# Patient Record
Sex: Female | Born: 1980 | Race: White | Hispanic: No | Marital: Married | Smoking: Former smoker
Health system: Southern US, Community
[De-identification: ages and names within clinical notes are randomized; demographics above are authoritative.]

## PROBLEM LIST (undated history)

## (undated) DIAGNOSIS — E88819 Insulin resistance, unspecified: Secondary | ICD-10-CM

## (undated) DIAGNOSIS — E282 Polycystic ovarian syndrome: Secondary | ICD-10-CM

## (undated) DIAGNOSIS — G43909 Migraine, unspecified, not intractable, without status migrainosus: Secondary | ICD-10-CM

## (undated) DIAGNOSIS — E8881 Metabolic syndrome: Secondary | ICD-10-CM

## (undated) DIAGNOSIS — E119 Type 2 diabetes mellitus without complications: Secondary | ICD-10-CM

## (undated) DIAGNOSIS — M797 Fibromyalgia: Secondary | ICD-10-CM

## (undated) HISTORY — PX: ESOPHAGOGASTRODUODENOSCOPY: SHX1529

## (undated) HISTORY — DX: Polycystic ovarian syndrome: E28.2

## (undated) HISTORY — DX: Migraine, unspecified, not intractable, without status migrainosus: G43.909

## (undated) HISTORY — DX: Type 2 diabetes mellitus without complications: E11.9

## (undated) HISTORY — PX: TUBAL LIGATION: SHX77

## (undated) HISTORY — DX: Insulin resistance, unspecified: E88.819

## (undated) HISTORY — DX: Metabolic syndrome: E88.81

## (undated) HISTORY — DX: Fibromyalgia: M79.7

## (undated) MED FILL — Iron Sucrose Inj 20 MG/ML (Fe Equiv): INTRAVENOUS | Qty: 10 | Status: AC

---

## 2005-10-27 ENCOUNTER — Ambulatory Visit: Payer: Self-pay | Admitting: Gastroenterology

## 2008-03-22 LAB — CONVERTED CEMR LAB: Pap Smear: NORMAL

## 2008-04-12 ENCOUNTER — Encounter: Payer: Self-pay | Admitting: Family Medicine

## 2008-04-17 ENCOUNTER — Encounter: Payer: Self-pay | Admitting: Family Medicine

## 2008-04-19 ENCOUNTER — Encounter: Payer: Self-pay | Admitting: Family Medicine

## 2008-05-01 ENCOUNTER — Encounter: Payer: Self-pay | Admitting: Family Medicine

## 2008-10-29 ENCOUNTER — Encounter: Payer: Self-pay | Admitting: Family Medicine

## 2008-12-17 ENCOUNTER — Ambulatory Visit: Payer: Self-pay | Admitting: Family Medicine

## 2008-12-17 DIAGNOSIS — E119 Type 2 diabetes mellitus without complications: Secondary | ICD-10-CM

## 2008-12-17 DIAGNOSIS — E282 Polycystic ovarian syndrome: Secondary | ICD-10-CM

## 2008-12-17 DIAGNOSIS — R1032 Left lower quadrant pain: Secondary | ICD-10-CM

## 2008-12-18 LAB — CONVERTED CEMR LAB
BUN: 6 mg/dL (ref 6–23)
Chloride: 103 meq/L (ref 96–112)
Cholesterol: 165 mg/dL (ref 0–200)
GFR calc non Af Amer: 91.69 mL/min (ref >60)
Glucose, Bld: 93 mg/dL (ref 70–99)
Potassium: 4 meq/L (ref 3.5–5.1)
Sodium: 139 meq/L (ref 135–145)

## 2008-12-20 ENCOUNTER — Encounter: Payer: Self-pay | Admitting: Family Medicine

## 2008-12-21 ENCOUNTER — Telehealth: Payer: Self-pay | Admitting: Family Medicine

## 2008-12-21 ENCOUNTER — Encounter: Payer: Self-pay | Admitting: Family Medicine

## 2009-02-14 ENCOUNTER — Encounter: Payer: Self-pay | Admitting: Family Medicine

## 2009-02-19 ENCOUNTER — Encounter: Payer: Self-pay | Admitting: Family Medicine

## 2009-02-19 DIAGNOSIS — N83209 Unspecified ovarian cyst, unspecified side: Secondary | ICD-10-CM

## 2009-03-15 ENCOUNTER — Encounter: Payer: Self-pay | Admitting: Family Medicine

## 2009-04-12 ENCOUNTER — Telehealth: Payer: Self-pay | Admitting: Family Medicine

## 2009-05-17 ENCOUNTER — Emergency Department: Payer: Self-pay | Admitting: Emergency Medicine

## 2009-05-24 ENCOUNTER — Inpatient Hospital Stay: Payer: Self-pay | Admitting: Obstetrics and Gynecology

## 2009-09-30 ENCOUNTER — Ambulatory Visit: Payer: Self-pay | Admitting: Obstetrics and Gynecology

## 2009-10-15 ENCOUNTER — Observation Stay: Payer: Self-pay | Admitting: Obstetrics and Gynecology

## 2009-10-21 ENCOUNTER — Observation Stay: Payer: Self-pay | Admitting: Obstetrics and Gynecology

## 2009-11-05 ENCOUNTER — Observation Stay: Payer: Self-pay | Admitting: Obstetrics and Gynecology

## 2009-11-17 ENCOUNTER — Observation Stay: Payer: Self-pay | Admitting: Obstetrics and Gynecology

## 2009-11-24 ENCOUNTER — Observation Stay: Payer: Self-pay

## 2009-11-29 ENCOUNTER — Observation Stay: Payer: Self-pay | Admitting: Obstetrics and Gynecology

## 2009-11-30 ENCOUNTER — Observation Stay: Payer: Self-pay | Admitting: Obstetrics and Gynecology

## 2009-12-09 ENCOUNTER — Inpatient Hospital Stay: Payer: Self-pay | Admitting: Obstetrics and Gynecology

## 2010-03-21 ENCOUNTER — Ambulatory Visit
Admission: RE | Admit: 2010-03-21 | Discharge: 2010-03-21 | Payer: Self-pay | Source: Home / Self Care | Attending: Family Medicine | Admitting: Family Medicine

## 2010-03-21 DIAGNOSIS — R21 Rash and other nonspecific skin eruption: Secondary | ICD-10-CM | POA: Insufficient documentation

## 2010-04-01 NOTE — Progress Notes (Signed)
Summary: regarding metformin  Phone Note Call from Patient Call back at Home Phone [phone]   Caller: Patient Call For: Ruthe Mannan MD Summary of Call: Pt has found out that she is pregnant and is asking if she should continue her metformin.  She does have an OB so I told her to check with that office as well.  She is asking if you need to see her regarding this, she will need refills called in next week.  Please advise, she knows that you are out tll monday. Initial call taken by: Lowella Petties CMA,  April 12, 2009 3:10 PM  Follow-up for Phone Call        Meformin is pregnancy Class B, probably safe in pregnancy.  I would however still check with her OB. Follow-up by: Ruthe Mannan MD,  April 15, 2009 8:10 AM  Additional Follow-up for Phone Call Additional follow up Details #1::        Patient Advised.  Additional Follow-up by: Delilah Shan CMA Duncan Dull),  April 15, 2009 10:34 AM

## 2010-04-02 ENCOUNTER — Telehealth: Payer: Self-pay | Admitting: Family Medicine

## 2010-04-03 NOTE — Assessment & Plan Note (Signed)
Summary: ?sinus infection/alc   Vital Signs:  Patient profile:   30 year old female Height:      62.5 inches Weight:      162.25 pounds BMI:     29.31 Temp:     98.3 degrees F oral Pulse rate:   84 / minute Pulse rhythm:   regular BP sitting:   102 / 64  (left arm) Cuff size:   regular  Vitals Entered By: Delilah Shan CMA Ariz Terrones Dull) (March 21, 2010 2:10 PM) CC: ? sinus infection   History of Present Illness: ST.  Stuffy.  coughing and yellow sputum.  Sx started 1.5 weeks.  Taking mucinex/otc meds.  No fevers.  Cough worse at night.  No ear pain.  Facial pain near the nose.  Not wheezing.   NSVD, gestational DM2.  Sugar has been controlled during the day.  Was prev breastfeeding and now using soy formula.  NKDA.    Rash and bleeding on the knuckles, worse with soaps.  Asking for advice.    It was better during pregnancy.  Allergies (verified): No Known Drug Allergies  Review of Systems       See HPI.  Otherwise negative.    Physical Exam  General:  GEN: nad, alert and oriented HEENT: mucous membranes moist, TM w/o erythema, nasal epithelium injected, OP with cobblestoning NECK: supple w/o LA CV: rrr. PULM: ctab, no inc wob ABD: soft, +bs EXT: no edema  max sinus tender to palpation bilaterally skin with mild irriation along in the extensor IP joints on hands bilaterally   Impression & Recommendations:  Problem # 1:  SINUSITIS - ACUTE-NOS (ICD-461.9) Start amoxil and supportive tx o/w.  She agrees.  Nontoxic.  Her updated medication list for this problem includes:    Amoxicillin 500 Mg Tabs (Amoxicillin) .Marland Kitchen... 2 by mouth two times a day  Orders: Prescription Created Electronically [phone])  Problem # 2:  SKIN RASH (ICD-782.1) Likely combination of irritation and eczema.  Rec OTC hydrocortisone as needed.  Can use gloves/lotion as needed. She agrees.   follow up as needed.  The following medications were removed from the medication list:    Triamcinolone  Acetonide 0.5 % Crea (Triamcinolone acetonide) .Marland Kitchen... Apply to affected area as needed  Complete Medication List: 1)  Metformin Hcl 500 Mg Xr24h-tab (Metformin hcl) .... Take 2 tablets by mouth at night 2)  Amoxicillin 500 Mg Tabs (Amoxicillin) .... 2 by mouth two times a day  Patient Instructions: 1)  Get plenty of rest, drink lots of clear liquids, and use Tylenol or Ibuprofen for fever and comfort. Start the antibiotics today and let us know if you aren't improving.   Prescriptions: AMOXICILLIN 500 MG TABS (AMOXICILLIN) 2 by mouth two times a day  #40 x 0   Entered and Authorized by:   Crawford Givens MD   Signed by:   Crawford Givens MD on 03/21/2010   Method used:   Electronically to        [facility]* (retail)       [address]       [place], [place]  [address]       Ph: [phone]       Fax: [phone]   RxID:   [id number]    Orders Added: 1)  Prescription Created Electronically [G8553] 2)  Est. Patient Level III [25427]    Current Allergies (reviewed today): No known allergies

## 2010-04-09 NOTE — Progress Notes (Signed)
Summary: still has sinus symptoms  Phone Note Call from Patient Call back at Work Phone [phone]   Caller: Patient Call For: Dr. Sharen Hones Summary of Call: Pt was taking amox for a sinus infection, she finished this 2 days ago and still has sxs- stuffy nose, green drainage, cough.  No fever.  She is asking if she needs to continue on with more amox or try something else.  [facility] Initial call taken by: Lowella Petties CMA, AAMA,  April 02, 2010 12:09 PM  Follow-up for Phone Call        may wait until provider who saw her comes in this PM as I've never seen pt. Follow-up by: Eustaquio Boyden  MD,  April 02, 2010 12:30 PM  Additional Follow-up for Phone Call Additional follow up Details #1::        pleae call in zithromax 250mg , 2 by mouth today and then 1 by mouth once daily for 4 days.  #6, no rf, and follow up next week if not improved. Please talk to her about drinking plenty of fluids, nasal saline, etc. Additional Follow-up by: Crawford Givens MD,  April 02, 2010 12:43 PM    Additional Follow-up for Phone Call Additional follow up Details #2::    Advises pt, medicine called to [facility], emr updated.                  Lowella Petties CMA, AAMA  April 02, 2010 3:46 PM   New/Updated Medications: ZITHROMAX 250 MG TABS (AZITHROMYCIN) take as directed Prescriptions: ZITHROMAX 250 MG TABS (AZITHROMYCIN) take as directed  #1 pack x 0   Entered by:   Lowella Petties CMA, AAMA   Authorized by:   Crawford Givens MD   Signed by:   Lowella Petties CMA, AAMA on 04/02/2010   Method used:   Telephoned to ...       [facility]* (retail)       [address]       [place], [place]  [address]       Ph: [phone]       Fax: [phone]   RxID:   [id number]   Prior Medications: METFORMIN HCL 500 MG XR24H-TAB (METFORMIN HCL) Take 2 tablets by mouth at night Current Allergies: No known allergies

## 2010-04-30 ENCOUNTER — Encounter: Payer: Self-pay | Admitting: Family Medicine

## 2010-04-30 ENCOUNTER — Ambulatory Visit (INDEPENDENT_AMBULATORY_CARE_PROVIDER_SITE_OTHER): Payer: Managed Care, Other (non HMO) | Admitting: Family Medicine

## 2010-04-30 DIAGNOSIS — M545 Low back pain: Secondary | ICD-10-CM

## 2010-04-30 LAB — CONVERTED CEMR LAB
Bilirubin Urine: NEGATIVE
Ketones, urine, test strip: NEGATIVE
Nitrite: NEGATIVE
Specific Gravity, Urine: 1.02
Urobilinogen, UA: 0.2

## 2010-05-08 NOTE — Assessment & Plan Note (Signed)
Summary: BACK PAIN/CLE   [employer]   Vital Signs:  Patient profile:   30 year old female Weight:      163 pounds Temp:     98.1 degrees F oral Pulse rate:   76 / minute Pulse rhythm:   regular BP sitting:   98 / 60  (left arm) Cuff size:   regular  Vitals Entered By: Selena Batten Dance CMA Duncan Dull) (April 30, 2010 9:22 AM) CC: Back pain   History of Present Illness: CC: back pain  back pain going on since October, first episode was 2 wks after had baby.  Pain starts L upper lumbar region (sometimes R as well).  Sharp stabbing pain, lasts 20 min to 1 hour.  Stays in back.  when happens, does get nauseated and vomits (2/2 pain).  Back pain not associated with headaches.  Has tried heating pad and husband rubbing back.  Also tried goody powders and ibuprofen.  worse if prolonged sitting or bending over.  Not breast feeding any more.  Not constant, hasn't found any inciting event.  No radiculopathy, tingling/numbness or saddle anesthesia, weakness in legs, bowel/bladder incontinence or trouble voiding (retention).  No fevers/chills.  no dysuria, polyuria, urgency.  Current Medications (verified): 1)  Metformin Hcl 500 Mg Xr24h-Tab (Metformin Hcl) .... Take 2 Tablets By Mouth At Night  Allergies (verified): No Known Drug Allergies  Past History:  Past Medical History: Last updated: 12/17/2008 PCOS with infertility, followed by [facility] Insulin resistance Migraines  Past Surgical History: EGD  Social History: Lives with husbnad.  Works at [employer]. Going back to school. Never Smoked Alcohol use-no Drug use-no  Review of Systems       per HPI  Physical Exam  General:  Well-developed,well-nourished,in no acute distress; alert,appropriate and cooperative throughout examination Mouth:  Oral mucosa and oropharynx without lesions or exudates.  Teeth in good repair. Lungs:  Normal respiratory effort, chest expands symmetrically. Lungs are clear to auscultation, no  crackles or wheezes. Heart:  Normal rate and regular rhythm. S1 and S2 normal without gallop, murmur, click, rub or other extra sounds. Abdomen:  Bowel sounds positive,abdomen soft and non-tender without masses, organomegaly or hernias noted.  no CVA tenderness Msk:  midline spinal tenderness mild at thoracic region.  Also at upper lumbar region.  + bilateral lumbar paraspinous mm tenderness L>R /tightness R>L.  neg SLR bilaterally.  No SIJ or GTB pain.  FROM at spine. Pulses:  2+ rad pulses Extremities:  no pedal edema Neurologic:  sensation/strength intact BUE and BLE. Skin:  Intact without suspicious lesions or rashes   Impression & Recommendations:  Problem # 1:  BACK PAIN, LUMBAR (ICD-724.2) Assessment New  consistent with lumbar strain vs muscle strain/spasm.  Treat with flexeril/naprosyn, ice/heat, and lower back strengthening exercises provided from [facility] patient advisor  return if not improved iwth this regimen for further evaluation.    UA done but I didn't realize until later.  no evidence of infection.  tr blood.  Her updated medication list for this problem includes:    Naprosyn 500 Mg Tabs (Naproxen) .Marland Kitchen... Take one twice daily with food for 7 days then as needed back pain    Flexeril 5 Mg Tabs (Cyclobenzaprine hcl) ..... One twice daily as needed muscle spasm  Orders: UA Dipstick w/o Micro (manual) (40981)  Complete Medication List: 1)  Metformin Hcl 500 Mg Xr24h-tab (Metformin hcl) .... Take 2 tablets by mouth at night 2)  Naprosyn 500 Mg Tabs (Naproxen) .... Take  one twice daily with food for 7 days then as needed back pain 3)  Flexeril 5 Mg Tabs (Cyclobenzaprine hcl) .... One twice daily as needed muscle spasm  Patient Instructions: 1)  Sounds like possible lumbar strain/muscle spasm. 2)  Treat with naprosyn twice daily for 7 days then as needed (with food).  Take flexeril two times a day as needed back pain 3)  Heating pad to back. 4)  Back stretching exercises  provided today. 5)  If not better with these measures, please return to be seen.  If worsening, return sooner.   6)  Call us with questions Prescriptions: FLEXERIL 5 MG TABS (CYCLOBENZAPRINE HCL) one twice daily as needed muscle spasm  #30 x 0   Entered and Authorized by:   Eustaquio Boyden  MD   Signed by:   Eustaquio Boyden  MD on 04/30/2010   Method used:   Electronically to        [facility]* (retail)       [address]       [place], [place]  [address]       Ph: [phone]       Fax: [phone]   RxID:   [id number] NAPROSYN 500 MG TABS (NAPROXEN) take one twice daily with food for 7 days then as needed back pain  #60 x 0   Entered and Authorized by:   Eustaquio Boyden  MD   Signed by:   Eustaquio Boyden  MD on 04/30/2010   Method used:   Electronically to        [facility]* (retail)       [address]       [place], [place]  [address]       Ph: [phone]       Fax: [phone]   RxID:   [id number]    Orders Added: 1)  Est. Patient Level III [01601] 2)  UA Dipstick w/o Micro (manual) [81002]    Current Allergies (reviewed today): No known allergies   Laboratory Results   Urine Tests  Date/Time Received: April 30, 2010 9:28 AM  Date/Time Reported: April 30, 2010 9:28 AM   Routine Urinalysis   Color: yellow Appearance: Hazy Glucose: negative   (Normal Range: Negative) Bilirubin: negative   (Normal Range: Negative) Ketone: negative   (Normal Range: Negative) Spec. Gravity: 1.020   (Normal Range: 1.003-1.035) Blood: trace-lysed   (Normal Range: Negative) pH: 6.0   (Normal Range: 5.0-8.0) Protein: negative   (Normal Range: Negative) Urobilinogen: 0.2   (Normal Range: 0-1) Nitrite: negative   (Normal Range: Negative) Leukocyte Esterace: negative   (Normal Range: Negative)       Appended Document: BACK PAIN/CLE   [employer]    Clinical Lists Changes  Observations: Added new observation of COMMENTS: read by ........Eustaquio Boyden  MD  May 01, 2010 12:49 PM  (05/01/2010 12:48) Added new observation of UA YEAST: no (05/01/2010 12:48) Added new observation of CASTS URINE: no (05/01/2010 12:48) Added new observation of URINE CRYSTA: no (05/01/2010 12:48) Added new observation of EPI CELL UR: 1-5 (05/01/2010 12:48) Added new observation of MUCUS URINE: yes (05/01/2010 12:48) Added new observation of BACTERIA URN: 1+ rods (05/01/2010 12:48) Added new observation of RBCS MICRO U: 0  (05/01/2010 12:48) Added new observation of WBCS MICRO U: 0 cells/hpf (05/01/2010 12:48)    read morning after spun.  Laboratory Results   Urine Tests    Urine Microscopic WBC/HPF: 0 RBC/HPF: 0 Bacteria/HPF: 1+ rods Mucous/HPF: yes Epithelial/HPF: 1-5  Crystals/HPF: no Casts/LPF: no Yeast/HPF: no    Comments: read by ........Eustaquio Boyden  MD  May 01, 2010 12:49 PM

## 2010-05-27 ENCOUNTER — Encounter: Payer: Self-pay | Admitting: Family Medicine

## 2010-06-03 ENCOUNTER — Ambulatory Visit (INDEPENDENT_AMBULATORY_CARE_PROVIDER_SITE_OTHER): Payer: Managed Care, Other (non HMO) | Admitting: Family Medicine

## 2010-06-03 ENCOUNTER — Encounter: Payer: Self-pay | Admitting: Family Medicine

## 2010-06-03 DIAGNOSIS — Z0000 Encounter for general adult medical examination without abnormal findings: Secondary | ICD-10-CM

## 2010-06-03 DIAGNOSIS — E282 Polycystic ovarian syndrome: Secondary | ICD-10-CM

## 2010-06-03 DIAGNOSIS — E348 Other specified endocrine disorders: Secondary | ICD-10-CM

## 2010-06-03 DIAGNOSIS — R21 Rash and other nonspecific skin eruption: Secondary | ICD-10-CM

## 2010-06-03 MED ORDER — FLUOCINONIDE 0.05 % EX OINT: TOPICAL_OINTMENT | Freq: Two times a day (BID) | CUTANEOUS | Status: AC

## 2010-06-03 MED ORDER — METAXALONE 800 MG PO TABS: 800.0000 mg | ORAL_TABLET | Freq: Three times a day (TID) | ORAL | Status: DC

## 2010-06-03 NOTE — Progress Notes (Signed)
30 yo female here for CPX.  PCOS- with h/o insulin resistance.  On Metformin 1000 mg daily. Feels she is doing well. Had follow up with OBGYN last month.  Back spasms- on her feet all day for work. Massage helps but at times needs a muscle relaxants.  Happens in both upper and lower back. Denies any radiculopathy or urinary symptoms.  The PMH, PSH, Social History, Family History, Medications, and allergies have been reviewed in [facility], and have been updated if relevant.  Review of Systems       See HPI General: Denies fever, chills, sweats. No significant weight loss. Eyes: Denies blurring,significant itching ENT: Denies earache, sore throat, and hoarseness.  Cardiovascular: Denies chest pains, palpitations, dyspnea on exertion,  Respiratory: Denies cough, dyspnea at rest,wheeezing Breast: no concerns about lumps GI: Denies nausea, vomiting, diarrhea, constipation, change in bowel habits, abdominal pain, melena, hematochezia GU: Denies dysuria, hematuria, urinary hesitancy, nocturia, denies STD risk, no concerns about discharge Derm: pos for rash rash, itching Neuro: Denies  paresthesias, frequent falls, frequent headaches Psych: Denies depression, anxiety Endocrine: Denies cold intolerance, heat intolerance, polydipsia Heme: Denies enlarged lymph nodes Allergy: No hayfever  Physical Exam BP 110/70  Pulse 84  Temp(Src) 98.7 F (37.1 C) (Oral)  Ht 5' 2.5" (1.588 m)  Wt 161 lb 12.8 oz (73.392 kg)  BMI 29.12 kg/m2  LMP 05/27/2010  General:  alert, overweight appearing , NAD Ears:  External ear exam shows no significant lesions or deformities.  Otoscopic examination reveals clear canals, tympanic membranes are intact bilaterally without bulging, retraction, inflammation or discharge. Hearing is grossly normal bilaterally. Mouth:  Oral mucosa and oropharynx without lesions or exudates.  Teeth in good repair. Neck:  No deformities, masses, or tenderness noted. Lungs:  Normal  respiratory effort, chest expands symmetrically. Lungs are clear to auscultation, no crackles or wheezes. Heart:  Normal rate and regular rhythm. S1 and S2 normal without gallop, murmur, click, rub or other extra sounds. Abdomen:  soft and normal bowel sounds.   No rebound, no guarding. Extremities:  No clubbing, cyanosis, edema, or deformity noted with normal full range of motion of all joints.   Skin:  Intact without suspicious lesions or rashes Psych:  normally interactive, good eye contact, and not anxious appearing.

## 2010-06-03 NOTE — Assessment & Plan Note (Signed)
Continue Metformin. Recheck a1c today.

## 2010-06-03 NOTE — Assessment & Plan Note (Signed)
Reviewed preventive care protocols, scheduled due services, and updated immunizations Discussed nutrition, exercise, diet, and healthy lifestyle.  Orders Placed This Encounter  Procedures  . TSH  . HgB A1c  . Lipid Profile  . Basic Metabolic Panel (BMET)

## 2010-06-04 LAB — LIPID PANEL
HDL: 62.1 mg/dL (ref >39.00)
Total CHOL/HDL Ratio: 3
VLDL: 32.2 mg/dL (ref 0.0–40.0)

## 2010-06-04 LAB — BASIC METABOLIC PANEL
Calcium: 9.3 mg/dL (ref 8.4–10.5)
GFR: 105.82 mL/min (ref >60.00)
Sodium: 139 mEq/L (ref 135–145)

## 2010-07-17 ENCOUNTER — Ambulatory Visit (INDEPENDENT_AMBULATORY_CARE_PROVIDER_SITE_OTHER): Payer: Managed Care, Other (non HMO) | Admitting: Family Medicine

## 2010-07-17 ENCOUNTER — Encounter: Payer: Self-pay | Admitting: Family Medicine

## 2010-07-17 VITALS — BP 120/74 | HR 96 | Temp 98.6°F | Ht 62.25 in | Wt 162.8 lb

## 2010-07-17 DIAGNOSIS — J329 Chronic sinusitis, unspecified: Secondary | ICD-10-CM

## 2010-07-17 MED ORDER — GUAIFENESIN-CODEINE 100-10 MG/5ML PO SYRP: 5.0000 mL | ORAL_SOLUTION | Freq: Two times a day (BID) | ORAL | Status: DC | PRN

## 2010-07-17 MED ORDER — AZITHROMYCIN 250 MG PO TABS: ORAL_TABLET | ORAL | Status: AC

## 2010-07-17 NOTE — Progress Notes (Signed)
Subjective:     Holly Farley is a 30 y.o. female who presents for evaluation of sinus pain. Symptoms include: congestion, cough, fevers and nasal congestion. Onset of symptoms was 5 days ago. Symptoms have been gradually worsening since that time. Past history is significant for no history of pneumonia or bronchitis. Patient is a non-smoker.  Infant son on abx for otitis media currently.    The PMH, PSH, Social History, Family History, Medications, and allergies have been reviewed in [facility], and have been updated if relevant.   Review of Systems Pertinent items are noted in HPI.   Objective:    BP 120/74  Pulse 96  Temp(Src) 98.6 F (37 C) (Oral)  Ht 5' 2.25" (1.581 m)  Wt 162 lb 12.8 oz (73.846 kg)  BMI 29.54 kg/m2  LMP 06/26/2010  General Appearance:    Alert, cooperative, no distress, appears stated age  Head:    Normocephalic, without obvious abnormality, atraumatic  Eyes:    PERRL, conjunctiva/corneas clear, EOM's intact, fundi    benign, both eyes  Ears:    TMs retracted bilaterally  Nose:  erythematous boggy turbinates, frontal sinuses TTP  Throat:   Erythema, no exudate   Neck:   Supple, symmetrical, trachea midline, no adenopathy;    thyroid:  no enlargement/tenderness/nodules; no carotid   bruit or JVD  Back:     Symmetric, no curvature, ROM normal, no CVA tenderness  Lungs:     Clear to auscultation bilaterally, respirations unlabored  Chest Wall:    No tenderness or deformity   Heart:    Regular rate and rhythm, S1 and S2 normal, no murmur, rub   or gallop  Skin:   Skin color, texture, turgor normal, no rashes or lesions  Lymph nodes:   Cervical, supraclavicular, and axillary nodes normal  Neurologic:   CNII-XII intact, normal strength, sensation and reflexes    throughout      Assessment:    Acute bacterial sinusitis.    Plan:    Nasal saline sprays. Azithromycin per medication orders.

## 2010-08-20 ENCOUNTER — Telehealth: Payer: Self-pay | Admitting: *Deleted

## 2010-08-20 DIAGNOSIS — M549 Dorsalgia, unspecified: Secondary | ICD-10-CM

## 2010-08-20 MED ORDER — METAXALONE 800 MG PO TABS: 800.0000 mg | ORAL_TABLET | Freq: Three times a day (TID) | ORAL | Status: DC

## 2010-08-20 NOTE — Telephone Encounter (Signed)
Rx called to pharmacy, patient will wait to hear from [facility] on referral.

## 2010-08-20 NOTE — Telephone Encounter (Signed)
Please call in her skelaxin but she now needs ortho referral. I will place referral.

## 2010-08-20 NOTE — Telephone Encounter (Signed)
Patient advised as instructed via telephone.  She stated that her pain now radiated up her back when she puts weight on her right foot.

## 2010-08-20 NOTE — Telephone Encounter (Signed)
Ok to refill her skelaxin, 30 tablets, no refills. If this does not help, need to refer to ortho or PT.

## 2010-08-20 NOTE — Telephone Encounter (Signed)
Patient says that you had been treating her for back spasms. She says that they are now getting worse and is asking if she could have something called in or what suggestions you have for her at this point. Uses [facility].

## 2010-10-22 ENCOUNTER — Ambulatory Visit (INDEPENDENT_AMBULATORY_CARE_PROVIDER_SITE_OTHER): Payer: Managed Care, Other (non HMO) | Admitting: Family Medicine

## 2010-10-22 ENCOUNTER — Encounter: Payer: Self-pay | Admitting: Family Medicine

## 2010-10-22 VITALS — BP 110/72 | HR 76 | Temp 98.0°F | Wt 167.2 lb

## 2010-10-22 DIAGNOSIS — J0100 Acute maxillary sinusitis, unspecified: Secondary | ICD-10-CM | POA: Insufficient documentation

## 2010-10-22 DIAGNOSIS — J019 Acute sinusitis, unspecified: Secondary | ICD-10-CM

## 2010-10-22 MED ORDER — FLUTICASONE PROPIONATE 50 MCG/ACT NA SUSP: 2.0000 | Freq: Every day | NASAL | Status: DC

## 2010-10-22 NOTE — Assessment & Plan Note (Addendum)
Likely viral treat with supportive care, as per pt instructions. Update if fever >101.5 or lasting longer than 10 days. flonase for sinus inflammation/congestion

## 2010-10-22 NOTE — Patient Instructions (Addendum)
You have an early sinus infection, likely viral. Take medicine as prescribed: flonase 2 squirts into each nostril daily. Push fluids and plenty of rest. Nasal saline irrigation or neti pot to help drain sinuses. May use mucinex D with plenty of fluid to help mobilize mucous. Return if fever >101.5, trouble opening/closing mouth, difficulty swallowing, or worsening.  Let us know if going on past 10 days.

## 2010-10-22 NOTE — Progress Notes (Signed)
  Subjective:    Patient ID: Holly Farley, female    DOB: 08-Nov-1980, 30 y.o.   MRN: [id number]  HPI CC: sinus pressure, congestion  3d h/o sinus pressure, RN, purulent mucous.  Mild cough productive as well, worse from post nasal drainage.  Has tried neti pot which helps some.   + sneezing.  No fevers/chills, abd pain, n/v/d, rashes.   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    No sick contacts at home.  No smokers at home.  No h/o asthma.  Has had sinus infections in past.  Review of Systems Per HPI    Objective:   Physical Exam  Nursing note and vitals reviewed. Constitutional: She appears well-developed and well-nourished. No distress.  HENT:  Head: Normocephalic and atraumatic.  Right Ear: Hearing, tympanic membrane, external ear and ear canal normal.  Left Ear: Hearing, tympanic membrane, external ear and ear canal normal.  Nose: Mucosal edema present. No rhinorrhea. Right sinus exhibits no maxillary sinus tenderness and no frontal sinus tenderness. Left sinus exhibits no maxillary sinus tenderness and no frontal sinus tenderness.  Mouth/Throat: Uvula is midline, oropharynx is clear and moist and mucous membranes are normal. No oropharyngeal exudate, posterior oropharyngeal edema, posterior oropharyngeal erythema or tonsillar abscesses.       congestion behind R TM turbinate erythema No PND  Eyes: Conjunctivae and EOM are normal. Pupils are equal, round, and reactive to light. No scleral icterus.  Neck: Normal range of motion. Neck supple.  Cardiovascular: Normal rate, regular rhythm, normal heart sounds and intact distal pulses.   No murmur heard. Pulmonary/Chest: Effort normal and breath sounds normal. No respiratory distress. She has no wheezes. She has no rales.  Lymphadenopathy:    She has no cervical adenopathy.  Skin: Skin is warm and dry. No rash noted.          Assessment & Plan:

## 2010-12-30 ENCOUNTER — Other Ambulatory Visit: Payer: Self-pay | Admitting: *Deleted

## 2010-12-30 MED ORDER — GLUCOSE BLOOD VI STRP: ORAL_STRIP | Status: DC

## 2010-12-30 MED ORDER — ONETOUCH DELICA LANCETS MISC: Status: DC

## 2010-12-30 NOTE — Telephone Encounter (Signed)
Received faxed refill request from pharmacy.  They are requesting that we fill out hard copy and fax back to them.  Rx's in your IN box.

## 2010-12-31 NOTE — Telephone Encounter (Signed)
Rx's faxed to [facility].

## 2011-01-21 ENCOUNTER — Ambulatory Visit (INDEPENDENT_AMBULATORY_CARE_PROVIDER_SITE_OTHER): Payer: Managed Care, Other (non HMO) | Admitting: Family Medicine

## 2011-01-21 ENCOUNTER — Encounter: Payer: Self-pay | Admitting: Family Medicine

## 2011-01-21 VITALS — BP 102/70 | HR 88 | Temp 98.4°F | Ht 62.25 in | Wt 167.8 lb

## 2011-01-21 DIAGNOSIS — Z0000 Encounter for general adult medical examination without abnormal findings: Secondary | ICD-10-CM

## 2011-01-21 DIAGNOSIS — R5383 Other fatigue: Secondary | ICD-10-CM

## 2011-01-21 DIAGNOSIS — E348 Other specified endocrine disorders: Secondary | ICD-10-CM

## 2011-01-21 DIAGNOSIS — R5381 Other malaise: Secondary | ICD-10-CM

## 2011-01-21 DIAGNOSIS — Z136 Encounter for screening for cardiovascular disorders: Secondary | ICD-10-CM

## 2011-01-21 LAB — CBC WITH DIFFERENTIAL/PLATELET
Basophils Relative: 0.3 % (ref 0.0–3.0)
Eosinophils Relative: 1.8 % (ref 0.0–5.0)
MCV: 91.4 fl (ref 78.0–100.0)
Monocytes Absolute: 0.4 10*3/uL (ref 0.1–1.0)
Monocytes Relative: 5.5 % (ref 3.0–12.0)
Neutrophils Relative %: 64.2 % (ref 43.0–77.0)
RBC: 4.69 Mil/uL (ref 3.87–5.11)
WBC: 7.3 10*3/uL (ref 4.5–10.5)

## 2011-01-21 LAB — BASIC METABOLIC PANEL
CO2: 27 mEq/L (ref 19–32)
Chloride: 103 mEq/L (ref 96–112)
Sodium: 140 mEq/L (ref 135–145)

## 2011-01-21 LAB — LIPID PANEL: VLDL: 31.2 mg/dL (ref 0.0–40.0)

## 2011-01-21 LAB — HEMOGLOBIN A1C: Hgb A1c MFr Bld: 5.7 % (ref 4.6–6.5)

## 2011-01-21 NOTE — Progress Notes (Signed)
30 yo female here for follow up.  PCOS- with h/o insulin resistance and GDM.  On Metformin 1000 mg daily.   Lab Results  Component Value Date   HGBA1C 5.7 06/03/2010   Wants to get pregnant again and so she has been checking her sugars 3-4 times daily. Fasting sugars have increased as high as 215. Denies increased thirst or urination. Strong FH of DM on both sides.  The PMH, PSH, Social History, Family History, Medications, and allergies have been reviewed in [facility], and have been updated if relevant.  Review of Systems       See HPI General: Denies fever, chills, sweats. No significant weight loss. Eyes: Denies blurring,significant itching ENT: Denies earache, sore throat, and hoarseness.  Cardiovascular: Denies chest pains, palpitations, dyspnea on exertion,  Respiratory: Denies cough, dyspnea at rest,wheeezing Breast: no concerns about lumps GI: Denies nausea, vomiting, diarrhea, constipation, change in bowel habits, abdominal pain, melena, hematochezia GU: Denies dysuria, hematuria, urinary hesitancy, nocturia, denies STD risk, no concerns about discharge Derm: pos for rash rash, itching Neuro: Denies  paresthesias, frequent falls, frequent headaches Psych: Denies depression, anxiety Endocrine: Denies cold intolerance, heat intolerance, polydipsia Heme: Denies enlarged lymph nodes Allergy: No hayfever  Physical Exam BP 102/70  Pulse 88  Temp(Src) 98.4 F (36.9 C) (Oral)  Ht 5' 2.25" (1.581 m)  Wt 167 lb 12 oz (76.091 kg)  BMI 30.44 kg/m2  LMP 01/01/2011  General:  alert, overweight appearing , NAD Ears:  External ear exam shows no significant lesions or deformities.  Otoscopic examination reveals clear canals, tympanic membranes are intact bilaterally without bulging, retraction, inflammation or discharge. Hearing is grossly normal bilaterally. Mouth:  Oral mucosa and oropharynx without lesions or exudates.  Teeth in good repair. Neck:  No deformities, masses, or  tenderness noted. Lungs:  Normal respiratory effort, chest expands symmetrically. Lungs are clear to auscultation, no crackles or wheezes. Heart:  Normal rate and regular rhythm. S1 and S2 normal without gallop, murmur, click, rub or other extra sounds. Abdomen:  soft and normal bowel sounds.   No rebound, no guarding. Extremities:  No clubbing, cyanosis, edema, or deformity noted with normal full range of motion of all joints.   Skin:  Intact without suspicious lesions or rashes Psych:  normally interactive, good eye contact, and not anxious appearing.    Assessment and Plan: 1. INSULIN RESISTANCE SYNDROME  HgB A1c, Basic Metabolic Panel (BMET)  Deteriorated. Recheck labs today but will likely need to increase Metformin to at least 750 mg twice daily. The patient indicates understanding of these issues and agrees with the plan.

## 2011-01-21 NOTE — Patient Instructions (Signed)
Good to see you. Have a Happy Thanksgiving.

## 2011-04-17 ENCOUNTER — Emergency Department: Payer: Self-pay | Admitting: Emergency Medicine

## 2011-04-28 ENCOUNTER — Telehealth: Payer: Self-pay | Admitting: *Deleted

## 2011-04-28 NOTE — Telephone Encounter (Signed)
In my box

## 2011-04-28 NOTE — Telephone Encounter (Signed)
Patient has brought in form for employment with [employer], form is on your desk.

## 2011-04-28 NOTE — Telephone Encounter (Signed)
Advised pt that form is ready for pick up, form placed up front.  I asked pt if she needed to have a TB test, she said they told her that was up to Dr. Elmer Sow discretion.

## 2011-06-09 ENCOUNTER — Encounter: Payer: Self-pay | Admitting: Family Medicine

## 2011-06-09 ENCOUNTER — Ambulatory Visit (INDEPENDENT_AMBULATORY_CARE_PROVIDER_SITE_OTHER): Payer: Managed Care, Other (non HMO) | Admitting: Family Medicine

## 2011-06-09 VITALS — BP 110/70 | HR 90 | Temp 98.6°F | Wt 172.8 lb

## 2011-06-09 DIAGNOSIS — J019 Acute sinusitis, unspecified: Secondary | ICD-10-CM

## 2011-06-09 MED ORDER — AMOXICILLIN-POT CLAVULANATE 875-125 MG PO TABS: 1.0000 | ORAL_TABLET | Freq: Two times a day (BID) | ORAL | Status: AC

## 2011-06-09 NOTE — Assessment & Plan Note (Signed)
Anticipate bacterial sinusitis, given duration and progression, treat with augmentin x 10 days. Supportive care as per instructions.

## 2011-06-09 NOTE — Patient Instructions (Signed)
You have a sinus infection. Take medicine as prescribed:augmentin Push fluids and plenty of rest. Nasal saline irrigation or neti pot to help drain sinuses. May use simple mucinex with plenty of fluid to help mobilize mucous. Let us know if fever >101.5, trouble opening/closing mouth, difficulty swallowing, or worsening - you may need to be seen again.  

## 2011-06-09 NOTE — Progress Notes (Signed)
  Subjective:    Patient ID: Holly Farley, female    DOB: 02-11-1981, 31 y.o.   MRN: [id number]  HPI CC: sinusitis  3 wk h/o sinus congestion.  Started getting bad last night.  ++ST, nasal congestion bilaterally, HA from sinus pressure, fatigue, muscle aches.  Mild cough and PND.  + subjective fever.  Some nausea.  + green/yellow mucous when blowing nose.  So far using flonase but not helping.  Also using vicks vaporub, mucinex and decongestant.  No abd pain, v, ear pain or tooth pain.  No smokers at home.  No sick contacts at home.  Works in school system.  Review of Systems Per HPI    Objective:   Physical Exam  Nursing note and vitals reviewed. Constitutional: She appears well-developed and well-nourished. No distress.  HENT:  Head: Normocephalic and atraumatic.  Right Ear: Hearing, tympanic membrane, external ear and ear canal normal.  Left Ear: Hearing, tympanic membrane, external ear and ear canal normal.  Nose: No mucosal edema or rhinorrhea. Right sinus exhibits no maxillary sinus tenderness and no frontal sinus tenderness. Left sinus exhibits maxillary sinus tenderness. Left sinus exhibits no frontal sinus tenderness.  Mouth/Throat: Uvula is midline, oropharynx is clear and moist and mucous membranes are normal. No oropharyngeal exudate, posterior oropharyngeal edema, posterior oropharyngeal erythema or tonsillar abscesses.  Eyes: Conjunctivae and EOM are normal. Pupils are equal, round, and reactive to light. No scleral icterus.  Neck: Normal range of motion. Neck supple.  Cardiovascular: Normal rate, regular rhythm, normal heart sounds and intact distal pulses.   No murmur heard. Pulmonary/Chest: Effort normal and breath sounds normal. No respiratory distress. She has no wheezes. She has no rales.  Lymphadenopathy:    She has no cervical adenopathy.  Skin: Skin is warm and dry. No rash noted.      Assessment & Plan:

## 2011-06-10 ENCOUNTER — Other Ambulatory Visit: Payer: Self-pay

## 2011-06-10 MED ORDER — METFORMIN HCL ER 500 MG PO TB24: 1000.0000 mg | ORAL_TABLET | Freq: Every day | ORAL | Status: DC

## 2011-06-10 NOTE — Telephone Encounter (Signed)
[facility] faxed request metformin HCL ER 500 mg #60 x 11.(confirmed with [place] at [place] same as med list metformin (glucophage xr) 500mg  24 hr tab).

## 2011-07-13 ENCOUNTER — Encounter: Payer: Self-pay | Admitting: Family Medicine

## 2011-07-13 ENCOUNTER — Ambulatory Visit (INDEPENDENT_AMBULATORY_CARE_PROVIDER_SITE_OTHER): Payer: Managed Care, Other (non HMO) | Admitting: Family Medicine

## 2011-07-13 VITALS — BP 118/80 | HR 80 | Temp 99.0°F | Wt 175.0 lb

## 2011-07-13 DIAGNOSIS — Z3201 Encounter for pregnancy test, result positive: Secondary | ICD-10-CM

## 2011-07-13 DIAGNOSIS — N926 Irregular menstruation, unspecified: Secondary | ICD-10-CM

## 2011-07-13 DIAGNOSIS — N912 Amenorrhea, unspecified: Secondary | ICD-10-CM

## 2011-07-13 LAB — POCT URINE PREGNANCY: Preg Test, Ur: POSITIVE

## 2011-07-13 NOTE — Progress Notes (Signed)
G1P1 with h/o PCOS here for ? Pregnancy.  Positive home pregnancy test. LMP -mid march.  She is taking PNV. Having some breast tenderness and fatigue this well.  Patient Active Problem List  Diagnoses  . POLYCYSTIC OVARIES  . INSULIN RESISTANCE SYNDROME  . OVARIAN CYST, LEFT  . ABDOMINAL PAIN, LEFT LOWER QUADRANT  . SKIN RASH  . BACK PAIN, LUMBAR  . Routine general medical examination at a health care facility  . Sinusitis acute   Past Medical History  Diagnosis Date  . PCOS (polycystic ovarian syndrome)     with infertility, followed by [facility]  . Insulin resistance   . Migraines    Past Surgical History  Procedure Date  . Esophagogastroduodenoscopy    History  Substance Use Topics  . Smoking status: Never Smoker   . Smokeless tobacco: Not on file  . Alcohol Use: No   Family History  Problem Relation Age of Onset  . Bipolar disorder Mother   . Migraines Mother   . Bipolar disorder Brother   . Diabetes Other    No Known Allergies Current Outpatient Prescriptions on File Prior to Visit  Medication Sig Dispense Refill  . fluticasone (FLONASE) 50 MCG/ACT nasal spray Place 2 sprays into the nose daily.  16 g  1  . glucose blood test strip One Touch Ultra test strips-use to test blood sugar four times daily  120 each  0  . metFORMIN (GLUCOPHAGE-XR) 500 MG 24 hr tablet Take 2 tablets (1,000 mg total) by mouth at bedtime.  60 tablet  11  . ONETOUCH DELICA LANCETS MISC Use one lancet four times daily  120 each  0  . PRESCRIPTION MEDICATION Take 1 tablet by mouth daily. Saphyll (birth control)        The PMH, PSH, Social History, Family History, Medications, and allergies have been reviewed in [facility], and have been updated if relevant.  ROS: See HPI No n/v/d  Physical exam: BP 118/80  Pulse 80  Temp(Src) 99 F (37.2 C) (Oral)  Wt 175 lb (79.379 kg)  General:  Well-developed,well-nourished,in no acute distress; alert,appropriate and cooperative throughout  examination Head:  normocephalic and atraumatic.   Nose:  no external deformity.   Mouth:  good dentition.   Psych:  Cognition and judgment appear intact. Alert and cooperative with normal attention span and concentration. No apparent delusions, illusions, hallucinations  Assessment and Plan: 1. Positive pregnancy test  Ambulatory referral to Obstetrics / Gynecology   New- approx [redacted] weeks gestation by LMP. Will refer back to OBGYN.  Advised continued PNV and to discuss Metformin with OBGYN- will likely continue through pregnancy. The patient indicates understanding of these issues and agrees with the plan.

## 2011-09-18 ENCOUNTER — Other Ambulatory Visit: Payer: Self-pay | Admitting: *Deleted

## 2011-09-18 MED ORDER — GLUCOSE BLOOD VI STRP: ORAL_STRIP | Status: DC

## 2011-09-29 ENCOUNTER — Ambulatory Visit: Payer: Self-pay | Admitting: Obstetrics and Gynecology

## 2011-10-01 ENCOUNTER — Ambulatory Visit: Payer: Self-pay | Admitting: Obstetrics and Gynecology

## 2011-10-06 ENCOUNTER — Telehealth: Payer: Self-pay

## 2011-10-06 NOTE — Telephone Encounter (Signed)
Pt is 16-[redacted] weeks pregnant; OB has advised Tylenol for h/a but pt has h/a everyday for 2 weeks and Tylenol is not helping.Pt wants to know what other med for h/a Dr Dayton Martes might recommend since pt is pregnant.Please advise. [facility].

## 2011-10-07 NOTE — Telephone Encounter (Signed)
Left message asking pt to call back. 

## 2011-10-07 NOTE — Telephone Encounter (Signed)
Advised patient

## 2011-10-07 NOTE — Telephone Encounter (Signed)
I agree with OB.  Unfortunately, there are not many safe pain medications during pregnancy. You could also try massage, heating pad, etc.

## 2011-10-20 ENCOUNTER — Encounter: Payer: Self-pay | Admitting: Obstetrics and Gynecology

## 2011-11-01 ENCOUNTER — Ambulatory Visit: Payer: Self-pay | Admitting: Obstetrics and Gynecology

## 2011-12-04 ENCOUNTER — Observation Stay: Payer: Self-pay

## 2011-12-04 LAB — URINALYSIS, COMPLETE
Bilirubin,UR: NEGATIVE
Blood: NEGATIVE
Glucose,UR: NEGATIVE mg/dL (ref 0–75)
Ph: 6 (ref 4.5–8.0)
Protein: NEGATIVE
RBC,UR: NONE SEEN /HPF (ref 0–5)
Squamous Epithelial: 3
WBC UR: <1 /HPF (ref 0–5)

## 2012-02-15 ENCOUNTER — Observation Stay: Payer: Self-pay | Admitting: Obstetrics and Gynecology

## 2012-02-15 LAB — URINALYSIS, COMPLETE
Bilirubin,UR: NEGATIVE
Blood: NEGATIVE
Glucose,UR: NEGATIVE mg/dL (ref 0–75)
Nitrite: NEGATIVE
Squamous Epithelial: 3
WBC UR: 1 /HPF (ref 0–5)

## 2012-02-25 ENCOUNTER — Observation Stay: Payer: Self-pay | Admitting: Obstetrics and Gynecology

## 2012-02-29 ENCOUNTER — Inpatient Hospital Stay: Payer: Self-pay | Admitting: Obstetrics and Gynecology

## 2012-02-29 LAB — CBC WITH DIFFERENTIAL/PLATELET
Basophil #: 0 10*3/uL (ref 0.0–0.1)
Eosinophil #: 0 10*3/uL (ref 0.0–0.7)
HCT: 32.4 % — ABNORMAL LOW (ref 35.0–47.0)
Lymphocyte #: 1.6 10*3/uL (ref 1.0–3.6)
Lymphocyte %: 17.4 %
MCHC: 35 g/dL (ref 32.0–36.0)
MCV: 87 fL (ref 80–100)
Monocyte #: 0.6 x10 3/mm (ref 0.2–0.9)
Monocyte %: 6.3 %
Neutrophil #: 7 10*3/uL — ABNORMAL HIGH (ref 1.4–6.5)
Neutrophil %: 75.7 %
RDW: 13.4 % (ref 11.5–14.5)
WBC: 9.2 10*3/uL (ref 3.6–11.0)

## 2012-03-01 LAB — HEMATOCRIT: HCT: 30.4 % — ABNORMAL LOW (ref 35.0–47.0)

## 2012-03-03 LAB — PATHOLOGY REPORT

## 2012-05-16 ENCOUNTER — Ambulatory Visit (INDEPENDENT_AMBULATORY_CARE_PROVIDER_SITE_OTHER): Payer: BC Managed Care – PPO | Admitting: Family Medicine

## 2012-05-16 ENCOUNTER — Encounter: Payer: Self-pay | Admitting: Family Medicine

## 2012-05-16 VITALS — BP 112/68 | HR 80 | Temp 98.1°F

## 2012-05-16 DIAGNOSIS — B369 Superficial mycosis, unspecified: Secondary | ICD-10-CM

## 2012-05-16 MED ORDER — FLUCONAZOLE 150 MG PO TABS: 150.0000 mg | ORAL_TABLET | ORAL | Status: DC

## 2012-05-16 NOTE — Assessment & Plan Note (Signed)
Would use diflucan PO qod for 4 doses.  D/w pt about continuing to pump and treating her son.  I checked her son's mouth, no fungal elements seen but I would treat presumptively given her exam.  The older child had been using the baby's pacifier, so it would be reasonable to treat him concurrently as well.  She agrees. F/u prn.

## 2012-05-16 NOTE — Progress Notes (Signed)
Breastfeeding, pt has noted white patches on her throat.  Also with irritation on breasts/nipples and abd wall.    Her son 2.5 months old, with white patches on tongue and feeding has been disrupted.    Husband and the other sone have similar oral lesions.  No one has any fevers.    Meds, vitals, and allergies reviewed.   ROS: See HPI.  Otherwise, noncontributory.  nad ncat No fungal elements noted in OP, neck supple rrr ctab Chaperoned exam with superficial fungal changes under the breasts and on the nipples w/o ulceration or mass noted

## 2012-05-16 NOTE — Patient Instructions (Addendum)
Use the diflucan every other day and keep pumping.  This should improve.  Take care.

## 2012-05-23 ENCOUNTER — Telehealth: Payer: Self-pay

## 2012-05-23 MED ORDER — FLUCONAZOLE 150 MG PO TABS: 150.0000 mg | ORAL_TABLET | Freq: Every day | ORAL | Status: DC

## 2012-05-23 NOTE — Telephone Encounter (Signed)
I would have her take diflucan daily for 1 week.  If not improved then recheck with Dr. Dayton Martes.  I d/w Dr. Dayton Martes and she agrees.  rx sent.

## 2012-05-23 NOTE — Telephone Encounter (Signed)
Pt seen 05/16/12; finished all Diflucan, no improvement; rash at nipples and under breast; pt said very difficult to pump. Pt using OTC anitfungal cream which is not helping.Medical [employer]. Pt request call back and if no answer leave message.

## 2012-05-23 NOTE — Telephone Encounter (Signed)
LMOVM

## 2012-05-24 ENCOUNTER — Telehealth: Payer: Self-pay | Admitting: Family Medicine

## 2012-05-24 NOTE — Telephone Encounter (Signed)
[employer] Medical Review forms have been dropped off for Dr. Dayton Martes to complete. I have placed them on your desk. Thank you

## 2012-05-25 ENCOUNTER — Telehealth: Payer: Self-pay | Admitting: *Deleted

## 2012-05-25 NOTE — Telephone Encounter (Signed)
Pt has brought in [facility] forms for completion, forms are on your desk- several forms.

## 2012-05-25 NOTE — Telephone Encounter (Signed)
Per Dr. Dayton Martes, pt needs an office visit to discuss paperwork, appt has been scheduled.

## 2012-05-26 ENCOUNTER — Encounter: Payer: Self-pay | Admitting: Family Medicine

## 2012-05-26 ENCOUNTER — Ambulatory Visit (INDEPENDENT_AMBULATORY_CARE_PROVIDER_SITE_OTHER): Payer: BC Managed Care – PPO | Admitting: Family Medicine

## 2012-05-26 VITALS — BP 100/70 | HR 72 | Temp 98.0°F | Wt 173.0 lb

## 2012-05-26 DIAGNOSIS — E8881 Metabolic syndrome: Secondary | ICD-10-CM

## 2012-05-26 DIAGNOSIS — E348 Other specified endocrine disorders: Secondary | ICD-10-CM

## 2012-05-26 LAB — CBC WITH DIFFERENTIAL/PLATELET
Basophils Absolute: 0 10*3/uL (ref 0.0–0.1)
HCT: 38.8 % (ref 36.0–46.0)
Lymphs Abs: 1.6 10*3/uL (ref 0.7–4.0)
MCV: 83.9 fl (ref 78.0–100.0)
Monocytes Absolute: 0.4 10*3/uL (ref 0.1–1.0)
Monocytes Relative: 7.1 % (ref 3.0–12.0)
Platelets: 252 10*3/uL (ref 150.0–400.0)
RDW: 13.6 % (ref 11.5–14.6)

## 2012-05-26 LAB — POCT URINALYSIS DIPSTICK
Bilirubin, UA: NEGATIVE
Blood, UA: NEGATIVE
Glucose, UA: NEGATIVE
Ketones, UA: NEGATIVE
Spec Grav, UA: 1.015

## 2012-05-26 LAB — LIPID PANEL: Cholesterol: 170 mg/dL (ref 0–200)

## 2012-05-26 LAB — HEMOGLOBIN A1C: Hgb A1c MFr Bld: 5.7 % (ref 4.6–6.5)

## 2012-05-26 NOTE — Progress Notes (Signed)
Subjective:    Patient ID: Holly Farley, female    DOB: 1980/10/27, 32 y.o.   MRN: [id number]  HPI  32 yo with h/o insulin resistance on Metformin 500 mg daily, here to fill out DMV forms.  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  Per pt, last a1c a year ago was 5.7.  Never been diagnosed with diabetes, even during her two pregnancies.  She does check her CBGs regularly.  No episodes of hypoglycemia.  Patient Active Problem List  Diagnosis  . POLYCYSTIC OVARIES  . INSULIN RESISTANCE SYNDROME  . ABDOMINAL PAIN, LEFT LOWER QUADRANT  . BACK PAIN, LUMBAR   Past Medical History  Diagnosis Date  . PCOS (polycystic ovarian syndrome)     with infertility, followed by [facility]  . Insulin resistance   . Migraines    Past Surgical History  Procedure Laterality Date  . Esophagogastroduodenoscopy     History  Substance Use Topics  . Smoking status: Never Smoker   . Smokeless tobacco: Not on file  . Alcohol Use: No   Family History  Problem Relation Age of Onset  . Bipolar disorder Mother   . Migraines Mother   . Bipolar disorder Brother   . Diabetes Other    No Known Allergies Current Outpatient Prescriptions on File Prior to Visit  Medication Sig Dispense Refill  . fluconazole (DIFLUCAN) 150 MG tablet Take 1 tablet (150 mg total) by mouth daily.  7 tablet  0  . glucose blood test strip One Touch Ultra test strips-use to test blood sugar four times daily  120 each  6  . metFORMIN (GLUCOPHAGE-XR) 500 MG 24 hr tablet Take 2 tablets (1,000 mg total) by mouth at bedtime.  60 tablet  11  . ONETOUCH DELICA LANCETS MISC Use one lancet four times daily  120 each  0   No current facility-administered medications on file prior to visit.   The PMH, PSH, Social History, Family History, Medications, and allergies have been reviewed in [facility], and have been updated if relevant.   Review of Systems See HPI No  episodes of altered mental status    Objective:   Physical Exam BP 100/70  Pulse 72  Temp(Src) 98 F (36.7 C)  Wt 173 lb (78.472 kg)  BMI 31.39 kg/m2  General:  Well-developed,well-nourished,in no acute distress; alert,appropriate and cooperative throughout examination Head:  normocephalic and atraumatic.   Lungs:  Normal respiratory effort, chest expands symmetrically. Lungs are clear to auscultation, no crackles or wheezes. Heart:  Normal rate and regular rhythm. S1 and S2 normal without gallop, murmur, click, rub or other extra sounds. Msk:  No deformity or scoliosis noted of thoracic or lumbar spine.   Extremities:  No clubbing, cyanosis, edema, or deformity noted with normal full range of motion of all joints.   Neurologic:  alert & oriented X3 and gait normal.   Skin:  Intact without suspicious lesions or rashes Psych:  Cognition and judgment appear intact. Alert and cooperative with normal attention span and concentration. No apparent delusions, illusions, hallucinations     Assessment & Plan:  1. Insulin resistance >25 min spent with face to face with patient, >50% counseling and/or coordinating care and filling out forms. She does NOT have diabetes, only insulin resistance that is well controlled on low dose Metformin.  Form asks for CBC, lipid panel, a1c and UA.  All checked today.  Will call  pt with forms once we have these results. The patient indicates understanding of these issues and agrees with the plan.    - Hemoglobin A1c - CBC with Differential - Lipid Panel

## 2012-05-26 NOTE — Telephone Encounter (Signed)
Filled out today during office visit.

## 2012-06-07 ENCOUNTER — Telehealth: Payer: Self-pay | Admitting: Family Medicine

## 2012-06-07 MED ORDER — FLUCONAZOLE 200 MG PO TABS: ORAL_TABLET | ORAL | Status: DC

## 2012-06-07 NOTE — Telephone Encounter (Signed)
Advised patient

## 2012-06-07 NOTE — Telephone Encounter (Signed)
Received message that breast infection is not healing.  Let's try fluconazole 400 mg x 1 day, then 200 mg daily x 14 days. Rx sent.  Please keep me posted.

## 2012-06-08 ENCOUNTER — Telehealth: Payer: Self-pay | Admitting: *Deleted

## 2012-06-08 NOTE — Telephone Encounter (Signed)
See note in Holly Farley's chart from 06/07/12 regarding pt's treatment for yeast infection.  Note mistakenly entered in Holly's chart, unable to copy and paste to patient's chart.

## 2012-07-28 ENCOUNTER — Ambulatory Visit (INDEPENDENT_AMBULATORY_CARE_PROVIDER_SITE_OTHER): Payer: BC Managed Care – PPO | Admitting: Family Medicine

## 2012-07-28 ENCOUNTER — Encounter: Payer: Self-pay | Admitting: Family Medicine

## 2012-07-28 VITALS — BP 100/64 | HR 71 | Temp 98.2°F | Ht 62.25 in | Wt 173.5 lb

## 2012-07-28 DIAGNOSIS — M7051 Other bursitis of knee, right knee: Secondary | ICD-10-CM

## 2012-07-28 DIAGNOSIS — IMO0002 Reserved for concepts with insufficient information to code with codable children: Secondary | ICD-10-CM

## 2012-07-28 NOTE — Progress Notes (Signed)
Nature conservation officer at [facility] [address] [place] [address] Phone: [phone] Fax: [phone]  Date:  07/28/2012   Name:  Holly Farley   DOB:  07-29-1980   MRN:  [id number] Gender: female Age: 32 y.o.  Primary Physician:  Ruthe Mannan, MD  Evaluating MD: Hannah Beat, MD   Chief Complaint: Knee Pain   History of Present Illness:  Holly Farley is a 32 y.o. pleasant patient who presents with the following:  5k mud run in September Knees did not bother her before.  Was an avid soccer player in high school, but since then has not been running and had a baby 2 years ago without starting much exercise since then.   No anterior pain, no effusion, no buckling, no trauma.   Started last wednes, before [employer]. M-W-F. Couch to 5 k. Running at least 5 days a week. Did not follow the program exactly and did not give herself days of rest in between.  Pes Anserine bursitis bilateral  Patient Active Problem List   Diagnosis Date Noted  . BACK PAIN, LUMBAR 04/30/2010  . POLYCYSTIC OVARIES 12/17/2008  . INSULIN RESISTANCE SYNDROME 12/17/2008  . ABDOMINAL PAIN, LEFT LOWER QUADRANT 12/17/2008    Past Medical History  Diagnosis Date  . PCOS (polycystic ovarian syndrome)     with infertility, followed by [facility]  . Insulin resistance   . Migraines     Past Surgical History  Procedure Laterality Date  . Esophagogastroduodenoscopy      History   Social History  . Marital Status: Married    Spouse Name: N/A    Number of Children: N/A  . Years of Education: N/A   Occupational History  . [employer], going back to school    Social History Main Topics  . Smoking status: Never Smoker   . Smokeless tobacco: Not on file  . Alcohol Use: No  . Drug Use: No  . Sexually Active: Not on file   Other Topics Concern  . Not on file   Social History Narrative  . No narrative on file    Family History  Problem Relation Age of Onset  . Bipolar disorder Mother   .  Migraines Mother   . Bipolar disorder Brother   . Diabetes Other     No Known Allergies  Medication list has been reviewed and updated.  Outpatient Prescriptions Prior to Visit  Medication Sig Dispense Refill  . metFORMIN (GLUCOPHAGE-XR) 500 MG 24 hr tablet Take 2 tablets (1,000 mg total) by mouth at bedtime.  60 tablet  11  . fluconazole (DIFLUCAN) 200 MG tablet 2 tabs by mouth x 1 day, then 1 tab by mouth daily x 14 days  16 tablet  0  . glucose blood test strip One Touch Ultra test strips-use to test blood sugar four times daily  120 each  6  . ONETOUCH DELICA LANCETS MISC Use one lancet four times daily  120 each  0   No facility-administered medications prior to visit.    Review of Systems:   GEN: No fevers, chills. Nontoxic. Primarily MSK c/o today. MSK: Detailed in the HPI GI: tolerating PO intake without difficulty Neuro: No numbness, parasthesias, or tingling associated. Otherwise the pertinent positives of the ROS are noted above.    Physical Examination: BP 100/64  Pulse 71  Temp(Src) 98.2 F (36.8 C) (Oral)  Ht 5' 2.25" (1.581 m)  Wt 173 lb 8 oz (78.699 kg)  BMI 31.49 kg/m2  SpO2 99%  Ideal Body Weight: Weight in (lb) to have BMI = 25: 137.5   GEN: WDWN, NAD, Non-toxic, Alert & Oriented x 3 HEENT: Atraumatic, Normocephalic.  Ears and Nose: No external deformity. EXTR: No clubbing/cyanosis/edema NEURO: Normal gait.  PSYCH: Normally interactive. Conversant. Not depressed or anxious appearing.  Calm demeanor.   Knee:  b Gait: Normal heel toe pattern ROM: 0-135 Effusion: neg Echymosis or edema: none Patellar tendon NT Painful PLICA: neg Patellar grind: negative Medial and lateral patellar facet loading: negative PES BURSA NOTABLY TENDER B medial and lateral joint lines:NT Mcmurray's neg Flexion-pinch neg Varus and valgus stress: stable Lachman: neg Ant and Post drawer: neg Hip abduction, IR, ER: WNL Hip flexion str: 5/5 Hip abd: 5/5 Quad:  5/5 VMO atrophy:No Hamstring concentric and eccentric: 5/5   Assessment and Plan:  Pes anserinus bursitis of both knees  Classic pes bursitis B Overtraining in a deconditioned state.  No impact until feeling better, then resume c25k program, but follow it. Ice area now and post-running.  Pes Anserine Bursitis Injection (bilateral) Verbal consent was obtained. Risks (including rare infection, skin lightening, and potential atrophy), benefits, and alternatives explained. Sterilely prepped with Chloraprep. Ethyl chloride for anesthesia. Under sterile conditions, 1/2 cc of Lidocaine 1% and 1/2 cc of Depo-Medrol 40 mg injected directly on the pes anserinus perpendicularly taking the needle to bone then slightly withdrawing. No resistance encountered. No complications with procedure and tolerated well. Patient had decreased pain post-injection. 22 gauge 1 1/2 inch needle   Orders Today:  No orders of the defined types were placed in this encounter.    Updated Medication List: (Includes new medications, updates to list, dose adjustments) No orders of the defined types were placed in this encounter.    Medications Discontinued: Medications Discontinued During This Encounter  Medication Reason  . ONETOUCH DELICA LANCETS MISC Error  . glucose blood test strip Error  . fluconazole (DIFLUCAN) 200 MG tablet Error      Signed, Jesper Stirewalt T. Paiden Cavell, MD 07/28/2012 4:02 PM

## 2012-11-30 ENCOUNTER — Encounter: Payer: Self-pay | Admitting: Family Medicine

## 2012-11-30 ENCOUNTER — Ambulatory Visit (INDEPENDENT_AMBULATORY_CARE_PROVIDER_SITE_OTHER): Payer: BC Managed Care – PPO | Admitting: Family Medicine

## 2012-11-30 VITALS — BP 110/64 | HR 72 | Temp 98.3°F | Ht 62.25 in | Wt 175.0 lb

## 2012-11-30 DIAGNOSIS — J019 Acute sinusitis, unspecified: Secondary | ICD-10-CM

## 2012-11-30 DIAGNOSIS — B9689 Other specified bacterial agents as the cause of diseases classified elsewhere: Secondary | ICD-10-CM

## 2012-11-30 MED ORDER — AMOXICILLIN-POT CLAVULANATE 875-125 MG PO TABS: 1.0000 | ORAL_TABLET | Freq: Two times a day (BID) | ORAL | Status: DC

## 2012-11-30 MED ORDER — METFORMIN HCL ER 500 MG PO TB24: 1000.0000 mg | ORAL_TABLET | Freq: Every day | ORAL | Status: DC

## 2012-11-30 NOTE — Patient Instructions (Addendum)
Drink lots of fluids Try to get extra rest  Try nasal saline spray to clear congestion  Take the augmentin as directed  Update if not starting to improve in a week or if worsening

## 2012-11-30 NOTE — Progress Notes (Signed)
Subjective:    Patient ID: Holly Farley, female    DOB: January 26, 1981, 32 y.o.   MRN: [id number]  HPI Here with sinus symptoms  Sinus pressure and pain -- this transforms into migraine often  Feels hot but no fever Exhausted - feeling lousy  Going on for about a month - just not getting rid of it  [place] Si /yellow nasal drainage  Not too much cough   No meds otc   Patient Active Problem List   Diagnosis Date Noted  . BACK PAIN, LUMBAR 04/30/2010  . POLYCYSTIC OVARIES 12/17/2008  . INSULIN RESISTANCE SYNDROME 12/17/2008  . ABDOMINAL PAIN, LEFT LOWER QUADRANT 12/17/2008   Past Medical History  Diagnosis Date  . PCOS (polycystic ovarian syndrome)     with infertility, followed by [facility]  . Insulin resistance   . Migraines    Past Surgical History  Procedure Laterality Date  . Esophagogastroduodenoscopy     History  Substance Use Topics  . Smoking status: Never Smoker   . Smokeless tobacco: Not on file  . Alcohol Use: No   Family History  Problem Relation Age of Onset  . Bipolar disorder Mother   . Migraines Mother   . Bipolar disorder Brother   . Diabetes Other    No Known Allergies Current Outpatient Prescriptions on File Prior to Visit  Medication Sig Dispense Refill  . metFORMIN (GLUCOPHAGE-XR) 500 MG 24 hr tablet Take 2 tablets (1,000 mg total) by mouth at bedtime.  60 tablet  11   No current facility-administered medications on file prior to visit.      Patient Active Problem List   Diagnosis Date Noted  . BACK PAIN, LUMBAR 04/30/2010  . POLYCYSTIC OVARIES 12/17/2008  . INSULIN RESISTANCE SYNDROME 12/17/2008  . ABDOMINAL PAIN, LEFT LOWER QUADRANT 12/17/2008   Past Medical History  Diagnosis Date  . PCOS (polycystic ovarian syndrome)     with infertility, followed by [facility]  . Insulin resistance   . Migraines    Past Surgical History  Procedure Laterality Date  . Esophagogastroduodenoscopy     History  Substance Use Topics  . Smoking status: Never  Smoker   . Smokeless tobacco: Not on file  . Alcohol Use: No   Family History  Problem Relation Age of Onset  . Bipolar disorder Mother   . Migraines Mother   . Bipolar disorder Brother   . Diabetes Other    No Known Allergies Current Outpatient Prescriptions on File Prior to Visit  Medication Sig Dispense Refill  . metFORMIN (GLUCOPHAGE-XR) 500 MG 24 hr tablet Take 2 tablets (1,000 mg total) by mouth at bedtime.  60 tablet  11   No current facility-administered medications on file prior to visit.    Review of Systems Review of Systems  Constitutional: Negative for fever, appetite change,  and unexpected weight change.  ENt pos for congestion/ sinus pain and st Eyes: Negative for pain and visual disturbance.  Respiratory: Negative for wheeze  and shortness of breath.   Cardiovascular: Negative for cp or palpitations    Gastrointestinal: Negative for nausea, diarrhea and constipation.  Genitourinary: Negative for urgency and frequency.  Skin: Negative for pallor or rash   Neurological: Negative for weakness, light-headedness, numbness and headaches.  Hematological: Negative for adenopathy. Does not bruise/bleed easily.  Psychiatric/Behavioral: Negative for dysphoric mood. The patient is not nervous/anxious.         Objective:   Physical Exam  Constitutional: She appears well-developed and well-nourished. No distress.  obese and  well appearing   HENT:  Head: Normocephalic and atraumatic.  Right Ear: External ear normal.  Left Ear: External ear normal.  Mouth/Throat: Oropharynx is clear and moist. No oropharyngeal exudate.  Nares are injected and congested  bilat ethmoid sinus tenderness  Eyes: Conjunctivae and EOM are normal. Pupils are equal, round, and reactive to light. Right eye exhibits no discharge. Left eye exhibits no discharge. No scleral icterus.  Neck: Normal range of motion. Neck supple. No thyromegaly present.  Cardiovascular: Normal rate and regular rhythm.    Pulmonary/Chest: Effort normal and breath sounds normal. No respiratory distress. She has no wheezes.  Lymphadenopathy:    She has no cervical adenopathy.  Neurological: She is alert.  Skin: Skin is warm and dry. No rash noted.  Psychiatric: She has a normal mood and affect.          Assessment & Plan:

## 2012-12-01 NOTE — Assessment & Plan Note (Signed)
Cover with augmentin  Disc symptomatic care - see instructions on AVS  Update if not starting to improve in a week or if worsening   

## 2012-12-07 ENCOUNTER — Encounter: Payer: Self-pay | Admitting: Family Medicine

## 2012-12-07 ENCOUNTER — Ambulatory Visit (INDEPENDENT_AMBULATORY_CARE_PROVIDER_SITE_OTHER): Payer: BC Managed Care – PPO | Admitting: Family Medicine

## 2012-12-07 VITALS — BP 110/76 | HR 85 | Temp 98.5°F | Ht 62.25 in | Wt 175.5 lb

## 2012-12-07 DIAGNOSIS — J019 Acute sinusitis, unspecified: Secondary | ICD-10-CM

## 2012-12-07 DIAGNOSIS — A088 Other specified intestinal infections: Secondary | ICD-10-CM

## 2012-12-07 DIAGNOSIS — B9689 Other specified bacterial agents as the cause of diseases classified elsewhere: Secondary | ICD-10-CM

## 2012-12-07 DIAGNOSIS — A084 Viral intestinal infection, unspecified: Secondary | ICD-10-CM

## 2012-12-07 MED ORDER — AZITHROMYCIN 250 MG PO TABS: ORAL_TABLET | ORAL | Status: DC

## 2012-12-07 MED ORDER — PROMETHAZINE HCL 25 MG PO TABS: 25.0000 mg | ORAL_TABLET | Freq: Three times a day (TID) | ORAL | Status: DC | PRN

## 2012-12-07 NOTE — Progress Notes (Signed)
Subjective:    Patient ID: Holly Farley, female    DOB: 09-05-1980, 32 y.o.   MRN: [id number]  HPI Here for f/u of sinusitis and also some new symptoms   Sinus symptoms are somewhat improved but still a lot of congestion- with yellow drainge Tight in face-not painful  No cough   N/v started yesterday am -- diarrrhea started soon after Vomited in am -- now queasy after she eats  Today - queasy in general- not eating ,  Diarrhea is loose stool , some cramping over low abdomen  Had a fever 101 today -- is achey today   Patient Active Problem List   Diagnosis Date Noted  . Acute bacterial sinusitis 11/30/2012  . BACK PAIN, LUMBAR 04/30/2010  . POLYCYSTIC OVARIES 12/17/2008  . INSULIN RESISTANCE SYNDROME 12/17/2008  . ABDOMINAL PAIN, LEFT LOWER QUADRANT 12/17/2008   Past Medical History  Diagnosis Date  . PCOS (polycystic ovarian syndrome)     with infertility, followed by [facility]  . Insulin resistance   . Migraines    Past Surgical History  Procedure Laterality Date  . Esophagogastroduodenoscopy     History  Substance Use Topics  . Smoking status: Never Smoker   . Smokeless tobacco: Not on file  . Alcohol Use: No   Family History  Problem Relation Age of Onset  . Bipolar disorder Mother   . Migraines Mother   . Bipolar disorder Brother   . Diabetes Other    No Known Allergies Current Outpatient Prescriptions on File Prior to Visit  Medication Sig Dispense Refill  . amoxicillin-clavulanate (AUGMENTIN) 875-125 MG per tablet Take 1 tablet by mouth 2 (two) times daily.  20 tablet  0  . metFORMIN (GLUCOPHAGE-XR) 500 MG 24 hr tablet Take 2 tablets (1,000 mg total) by mouth at bedtime.  60 tablet  3   No current facility-administered medications on file prior to visit.      Review of Systems Review of Systems  Constitutional: Negative for fever, appetite change,  and unexpected weight change. pos for fatigue ENT neg for throat or ear pain  Eyes: Negative for pain and  visual disturbance.  Respiratory: Negative for cough and shortness of breath.   Cardiovascular: Negative for cp or palpitations    Gastrointestinal: Negative for constipation. neg for blood in stool  Genitourinary: Negative for urgency and frequency.  Skin: Negative for pallor or rash   Neurological: Negative for weakness, light-headedness, numbness and headaches.  Hematological: Negative for adenopathy. Does not bruise/bleed easily.  Psychiatric/Behavioral: Negative for dysphoric mood. The patient is not nervous/anxious.         Objective:   Physical Exam  Constitutional: She appears well-developed and well-nourished. No distress.  HENT:  Head: Normocephalic and atraumatic.  Right Ear: External ear normal.  Left Ear: External ear normal.  Mouth/Throat: Oropharynx is clear and moist. No oropharyngeal exudate.  Nares are injected and congested  bilat maxillary sinus tenderness  Eyes: Conjunctivae and EOM are normal. Pupils are equal, round, and reactive to light. Right eye exhibits no discharge. Left eye exhibits no discharge.  Neck: Normal range of motion. Neck supple.  Cardiovascular: Normal rate and regular rhythm.   Pulmonary/Chest: Effort normal and breath sounds normal. No respiratory distress. She has no wheezes. She has no rales.  Abdominal: Soft. Bowel sounds are normal. She exhibits no distension and no mass. There is no tenderness. There is no rebound and no guarding.  Lymphadenopathy:    She has no cervical adenopathy.  Neurological:  She is alert. She has normal reflexes.  Skin: Skin is warm and dry. No erythema. No pallor.  Brisk cap refill  Psychiatric: She has a normal mood and affect.          Assessment & Plan:

## 2012-12-07 NOTE — Patient Instructions (Signed)
Sip fluids and try phenergan for nausea  When feeling better - advance to bland diet slowly  Tylenol for fever if needed Stop augmentin and get zpak instead Update if not starting to improve in a week or if worsening

## 2012-12-08 NOTE — Assessment & Plan Note (Signed)
Getting better  Disc hydration and BRAT diet Rev ss dehydration Update if not starting to improve in a week or if worsening

## 2012-12-08 NOTE — Assessment & Plan Note (Signed)
Improved/ not gone  Change to zpack after augmentin Disc symptomatic care - see instructions on AVS  Update if not starting to improve in a week or if worsening

## 2013-02-09 ENCOUNTER — Telehealth: Payer: Self-pay | Admitting: Family Medicine

## 2013-02-09 ENCOUNTER — Encounter: Payer: Self-pay | Admitting: Internal Medicine

## 2013-02-09 ENCOUNTER — Ambulatory Visit (INDEPENDENT_AMBULATORY_CARE_PROVIDER_SITE_OTHER): Payer: BC Managed Care – PPO | Admitting: Internal Medicine

## 2013-02-09 VITALS — BP 108/64 | HR 75 | Temp 98.2°F | Ht 63.0 in | Wt 186.8 lb

## 2013-02-09 DIAGNOSIS — N912 Amenorrhea, unspecified: Secondary | ICD-10-CM

## 2013-02-09 DIAGNOSIS — E282 Polycystic ovarian syndrome: Secondary | ICD-10-CM

## 2013-02-09 NOTE — Progress Notes (Signed)
Subjective:    Patient ID: Holly Farley, female    DOB: 1980-11-20, 32 y.o.   MRN: [id number]  HPI  Pt presents to the clinic today with c/o amenorrhea x 3 months. She does have a history of PCOS. She is on metformin. She did have a tubal ligation in 01/2012. She has had periods of amenorrhea in the past secondary to her PCOS. She has had multiple negative pregnancy tests at home.  Review of Systems      Past Medical History  Diagnosis Date  . PCOS (polycystic ovarian syndrome)     with infertility, followed by [facility]  . Insulin resistance   . Migraines     Current Outpatient Prescriptions  Medication Sig Dispense Refill  . metFORMIN (GLUCOPHAGE-XR) 500 MG 24 hr tablet Take 2 tablets (1,000 mg total) by mouth at bedtime.  60 tablet  3   No current facility-administered medications for this visit.    No Known Allergies  Family History  Problem Relation Age of Onset  . Bipolar disorder Mother   . Migraines Mother   . Bipolar disorder Brother   . Diabetes Other     History   Social History  . Marital Status: Married    Spouse Name: N/A    Number of Children: N/A  . Years of Education: N/A   Occupational History  . [employer], going back to school    Social History Main Topics  . Smoking status: Never Smoker   . Smokeless tobacco: Not on file  . Alcohol Use: No  . Drug Use: No  . Sexual Activity: Not on file   Other Topics Concern  . Not on file   Social History Narrative  . No narrative on file     Constitutional: Denies fever, malaise, fatigue, headache or abrupt weight changes.  GU: Denies urgency, frequency, pain with urination, burning sensation, blood in urine, odor or discharge.   No other specific complaints in a complete review of systems (except as listed in HPI above).  Objective:   Physical Exam   BP 108/64  Pulse 75  Temp(Src) 98.2 F (36.8 C) (Oral)  Ht 5\' 3"  (1.6 m)  Wt 186 lb 12.8 oz (84.732 kg)  BMI 33.10 kg/m2  SpO2 98%   LMP 11/04/2012 Wt Readings from Last 3 Encounters:  02/09/13 186 lb 12.8 oz (84.732 kg)  12/07/12 175 lb 8 oz (79.606 kg)  11/30/12 175 lb (79.379 kg)    General: Appears her stated age, obese but well developed, well nourished in NAD. Cardiovascular: Normal rate and rhythm. S1,S2 noted.  No murmur, rubs or gallops noted. No JVD or BLE edema. No carotid bruits noted. Pulmonary/Chest: Normal effort and positive vesicular breath sounds. No respiratory distress. No wheezes, rales or ronchi noted.  Abdomen: Soft and nontender. Normal bowel sounds, no bruits noted. No distention or masses noted. Liver, spleen and kidneys non palpable.  BMET    Component Value Date/Time   NA 140 01/21/2011 0758   K 4.2 01/21/2011 0758   CL 103 01/21/2011 0758   CO2 27 01/21/2011 0758   GLUCOSE 122* 01/21/2011 0758   BUN 11 01/21/2011 0758   CREATININE 0.8 01/21/2011 0758   CALCIUM 9.3 01/21/2011 0758   GFRNONAA 91.69 12/17/2008 1128    Lipid Panel     Component Value Date/Time   CHOL 170 05/26/2012 0807   TRIG 265.0* 05/26/2012 0807   HDL 47.00 05/26/2012 0807   CHOLHDL 4 05/26/2012 0807   VLDL  53.0* 05/26/2012 0807   LDLCALC 85 01/21/2011 0758    CBC    Component Value Date/Time   WBC 5.2 05/26/2012 0807   RBC 4.62 05/26/2012 0807   HGB 12.9 05/26/2012 0807   HCT 38.8 05/26/2012 0807   PLT 252.0 05/26/2012 0807   MCV 83.9 05/26/2012 0807   MCHC 33.3 05/26/2012 0807   RDW 13.6 05/26/2012 0807   LYMPHSABS 1.6 05/26/2012 0807   MONOABS 0.4 05/26/2012 0807   EOSABS 0.2 05/26/2012 0807   BASOSABS 0.0 05/26/2012 0807    Hgb A1C Lab Results  Component Value Date   HGBA1C 5.7 05/26/2012        Assessment & Plan:   Amenorrhea, likely secondary to PCOS:  Will check serum FSH, LH and Hcg Continue Metformin for now Can offer progesterone to see if this will start your periods again May benefit from a endo referral  RTC as needed- we will call you with the results

## 2013-02-09 NOTE — Telephone Encounter (Signed)
Pt forgot to ask you to refill medication when she was at appt. Pt said she needs test strips ultra 1 touch mini, not on med list

## 2013-02-09 NOTE — Patient Instructions (Signed)
Primary Amenorrhea  Primary amenorrhea is the absence of any menstrual flow in a woman by the age of 41. An average age for the start of menstruation is age 30. Primary amenorrhea is not considered to have occurred until a girl is over age 73 and has never menstruated. This may occur with or without other signs of puberty. CAUSES  Some common causes of not menstruating include:  Malnutrition.  Low blood sugar (hypoglycemia).  Polycystic ovarian disease (cysts in the ovaries, not ovulating).  Absence of the vagina, uterus, or ovaries since birth (congenital).  Extreme obesity.  Cystic fibrosis.  Drastic weight loss from any cause.  Over-exercising (running, biking) causing loss of body fat.  Pituitary gland tumor in the brain.  Long-term (chronic) illnesses.  Cushing's disease.  Thyroid disease (hypothyroidism, hyperthyroidism).  Part of the brain (hypothalamus) not functioning.  Premature ovarian failure. DIAGNOSIS  This diagnosis is made by doing a medical history and physical exam. Often, numerous blood tests of different hormones in the body may be done, along with some urine testing. Specialized x-rays may need to be done, as well as measuring the Body Mass Index (BMI). If your BMI is less than 20, the hypothalamus may be the problem. If it is greater than 30, the cause may be diabetes mellitus. Pregnancy must be ruled out. TREATMENT  Treatment depends on the cause of the amenorrhea. If an eating disorder is present, this can be treated with an adequate diet and therapy. Chronic illnesses may improve with treatment of the illness. Overall, the outlook is good, and amenorrhea may be corrected with medicines, lifestyle changes, or surgery. If the amenorrhea can not be corrected, it is sometimes possible to create a false menstruation with medicines, to help young women feel more normal. Should emotional problems occur, your caregiver will be able to help you.  Document  Released: 02/16/2005 Document Revised: 05/11/2011 Document Reviewed: 10/05/2007 [facility] Patient Information 2014 [place], [place].

## 2013-02-09 NOTE — Progress Notes (Signed)
Pre-visit discussion using our clinic review tool. No additional management support is needed unless otherwise documented below in the visit note.  

## 2013-02-10 ENCOUNTER — Other Ambulatory Visit: Payer: Self-pay | Admitting: Family Medicine

## 2013-02-10 ENCOUNTER — Telehealth: Payer: Self-pay

## 2013-02-10 LAB — FOLLICLE STIMULATING HORMONE: FSH: 4.9 m[IU]/mL

## 2013-02-10 LAB — HCG, SERUM, QUALITATIVE: Preg, Serum: NEGATIVE

## 2013-02-10 LAB — LUTEINIZING HORMONE: LH: 8.11 m[IU]/mL

## 2013-02-10 MED ORDER — GLUCOSE BLOOD VI STRP: ORAL_STRIP | Status: DC

## 2013-02-10 NOTE — Telephone Encounter (Signed)
RX sent

## 2013-02-10 NOTE — Telephone Encounter (Signed)
Ok to refill 

## 2013-02-10 NOTE — Telephone Encounter (Signed)
Patient called to get results notified as instructed by result note. Pt wants to know if there is further treatment or does she need endo referral. Pt request cb.

## 2013-02-13 NOTE — Telephone Encounter (Signed)
I think the best thing for her would be an endo referral. I can place that if she would like.

## 2013-02-15 ENCOUNTER — Telehealth: Payer: Self-pay

## 2013-02-15 NOTE — Telephone Encounter (Signed)
lmovm for pt to return call.  

## 2013-02-17 NOTE — Telephone Encounter (Signed)
Left message on voicemail.

## 2013-02-20 ENCOUNTER — Other Ambulatory Visit: Payer: Self-pay | Admitting: Internal Medicine

## 2013-02-20 DIAGNOSIS — E282 Polycystic ovarian syndrome: Secondary | ICD-10-CM

## 2013-02-22 ENCOUNTER — Ambulatory Visit (INDEPENDENT_AMBULATORY_CARE_PROVIDER_SITE_OTHER): Payer: BC Managed Care – PPO | Admitting: Internal Medicine

## 2013-02-22 ENCOUNTER — Encounter: Payer: Self-pay | Admitting: Internal Medicine

## 2013-02-22 VITALS — BP 122/70 | HR 84 | Temp 98.1°F | Resp 12 | Ht 63.0 in | Wt 188.1 lb

## 2013-02-22 DIAGNOSIS — E282 Polycystic ovarian syndrome: Secondary | ICD-10-CM

## 2013-02-22 DIAGNOSIS — E348 Other specified endocrine disorders: Secondary | ICD-10-CM

## 2013-02-22 MED ORDER — METFORMIN HCL ER 500 MG PO TB24: ORAL_TABLET | ORAL | Status: DC

## 2013-02-22 MED ORDER — GLUCOSE BLOOD VI STRP: ORAL_STRIP | Status: DC

## 2013-02-22 NOTE — Patient Instructions (Signed)
Please stop at the lab. I will send Metformin XR (Glucophage) to your pharmacy. Please come back for a follow-up appointment in 6 months. We might need to start Oral Contraceptives then, but not for now.  Please consider the following ways to cut down carbs and fat and increase fiber and micronutrients in your diet:  - substitute whole grain for white bread or pasta - substitute brown rice for white rice - substitute 90-calorie flat bread pieces for slices of bread when possible - substitute sweet potatoes or yams for white potatoes - substitute humus for margarine - substitute tofu for cheese when possible - substitute almond or rice milk for regular milk (would not drink soy milk daily due to concern for soy estrogen influence on breast cancer risk) - substitute dark chocolate for other sweets when possible - substitute water - can add lemon or orange slices for taste - for diet sodas (artificial sweeteners will trick your body that you can eat sweets without getting calories and will lead you to overeating and weight gain in the long run) - do not skip breakfast or other meals (this will slow down the metabolism and will result in more weight gain over time)  - can try smoothies made from fruit and almond/rice milk in am instead of regular breakfast - can also try old-fashioned (not instant) oatmeal made with almond/rice milk in am - order the dressing on the side when eating salad at a restaurant (pour less than half of the dressing on the salad) - eat as little meat as possible - can try juicing, but should not forget that juicing will get rid of the fiber, so would alternate with eating raw veg./fruits or drinking smoothies - use as little oil as possible, even when using olive oil - can dress a salad with a mix of balsamic vinegar and lemon juice, for e.g. - use agave nectar, stevia sugar, or regular sugar rather than artificial sweateners - steam or broil/roast veggies  - snack on  veggies/fruit/nuts (unsalted, preferably) when possible, rather than processed foods - reduce or eliminate aspartame in diet (it is in diet sodas, chewing gum, etc) Read the labels!  Try to read Dr. Katherina Right book: "Program for Reversing Diabetes" for the vegan concept and other ideas for healthy eating.  Plant-based diet materials: - Lectures (you tube):  Lequita Asal: "Breaking the Food Seduction"  Doug Lisle: "How to Lose Weight, without Losing Your Mind"  Lucile Crater: "What is Insulin Resistance" [employer]sonEntrepreneur.si - Documentaries:  Supersize Me  Food Inc.  Forks over [employer], Sick and Nearly Dead  The [employer] of the [employer] - Books:  Lequita Asal: "Program for Reversing Diabetes"  Ferol Luz: "The [place] Study"  Konrad Penta: "Supermarket Vegan" (cookbook) - Facebook pages:   Forks versus Knives  Vegucated  [employer]ys ''R'' Us Magazine  Food Matters - Healthy nutrition info websites:  LateTelevision.com.ee  Try to replace snacking on these with drinking/eating: * soy or almond milk * veggies with humus or other low calorie/low fat dip * low glycemic index fruits (higher glycemic index = higher risk to increase your sugars):          http://www.health.https://www.brown.info/ * fruit/veggie smoothies          Ninja blender recipes:          CultureParks.com.ee * unsalted nuts Etc.

## 2013-02-22 NOTE — Progress Notes (Signed)
Patient ID: Holly Farley, female   DOB: [birth year], 32 y.o.   MRN: [id number]  HPI: Holly Farley is a 32 y.o. female, referred by Nicki Reaper, NP, for evaluation for PCOS, dx 2009.  Weight gain: - 135 >> 186 lbs in 2008, before pregnancy in 2010 >> 157 (1200 calory diet) >> now 188 lbs - no steroid use - no weight loss meds - on Metformin XR 1000 mg at bedtime - started in 2008 - Meals: - Breakfast: 2 scrambled eggs or [place] yoghurt or chicken biscuit - Lunch: chicken breast, salads with ranch, fruit - Dinner: tacos, pizza, grilled chicken, veggies, salad - Snacks: 2: nabs, veggies + ranch dip Drinks:  - Diets tried: many - Exercise: no  Fertility/Menstrual cycles: - irregular menses, every 1-3 months >> regular cycles after last pregnancy until 11/2010 - h/o infertility, during which, she was dx with PCOS - seen at [facility] (tried Clomid, OCP) - + ovarian cysts on TV U/S 03/15/2009 >> small ovarian cysts c/w PCOS; 1 cyst drained - children: 2: 3 and 1 y/o (8 and 9 lbs) - miscarriages: no - contraception: tubal ligation  [facility]  Acne: - before menses or when she is supposed to start a cycle, even if she does not have a cycle; she also gets more craving then   Hirsutism: - minimal, on chin  Treatments tried: - currently on Metformin - did not try Spironolactone - did not try [place] - not on OCPs  Other medical pbs: - insulin resistance sd - past week, sugars increased >> 130-140s, before this:110s-120s  Pertinent labs: Component     Latest Ref Rng 05/26/2012 02/09/2013  Hemoglobin A1C     4.6 - 6.5 % 5.7   FSH       4.9  LH       8.11   - Last thyroid tests: Lab Results  Component Value Date   TSH 2.03 01/21/2011   - Last set of lipids:    Component Value Date/Time   CHOL 170 05/26/2012 0807   TRIG 265.0* 05/26/2012 0807   HDL 47.00 05/26/2012 0807   CHOLHDL 4 05/26/2012 0807   VLDL 53.0* 05/26/2012 0807   LDLCALC 85  01/21/2011 0758   - Last HbA1c: Lab Results  Component Value Date   HGBA1C 5.7 05/26/2012   Pt has FH of obesity in aunt >> she had GBP, more on her mom's side of the family. FH of DM in paternal uncle, maternal uncle, aunts.  ROS: see HPI + Constitutional: + weight gain, + fatigue, + increased appetite, no subjective hyperthermia/hypothermia, + poor sleep, + nocturia , + increased urination Eyes: + blurry vision, no xerophthalmia ENT: no sore throat, no nodules palpated in throat, no dysphagia/odynophagia, no hoarseness Cardiovascular: no CP/SOB/palpitations/leg swelling Respiratory: no cough/SOB Gastrointestinal: no N/V/D/C, + heartburn Musculoskeletal: + muscle aches /no joint aches Skin: + acne, + hair on face, + easy bruising, + stretch marks Neurological: no tremors/numbness/tingling/dizziness, + HA Psychiatric: no depression/anxiety Low libido  Past Medical History  Diagnosis Date  . PCOS (polycystic ovarian syndrome)     with infertility, followed by [facility]  . Insulin resistance   . Migraines    Past Surgical History  Procedure Laterality Date  . Esophagogastroduodenoscopy     History   Social History  . Marital Status: Married    Spouse Name: N/A    Number of Children: 2   Occupational History  . [employer], going back to school  Social History Main Topics  . Smoking status: Never Smoker   . Smokeless tobacco: Not on file  . Alcohol Use: No  . Drug Use: No   Current Outpatient Rx  Name  Route  Sig  Dispense  Refill  . glucose blood test strip      Use to test blood sugar 2 times daily.   100 each   5   . metFORMIN (GLUCOPHAGE-XR) 500 MG 24 hr tablet      Take 1000 mg by mouth 2x daily with meals.   120 tablet   11     Dispense as written.    I just sent a non-DAW Rx, please ignore it and dis ...     No Known Allergies Family History  Problem Relation Age of Onset  . Bipolar disorder Mother   . Migraines Mother   . Bipolar disorder  Brother   . Diabetes Other    PE: BP 122/70  Pulse 84  Temp(Src) 98.1 F (36.7 C) (Oral)  Resp 12  Ht 5\' 3"  (1.6 m)  Wt 188 lb 1.9 oz (85.331 kg)  BMI 33.33 kg/m2  SpO2 98%  LMP 02/17/2013 Wt Readings from Last 3 Encounters:  02/09/13 186 lb 12.8 oz (84.732 kg)  12/07/12 175 lb 8 oz (79.606 kg)  11/30/12 175 lb (79.379 kg)   Constitutional: overweight, in NAD, no full supraclavicular fat pads Eyes: PERRLA, EOMI, no exophthalmos ENT: moist mucous membranes, no thyromegaly, no cervical lymphadenopathy Cardiovascular: RRR, No MRG Respiratory: CTA B Gastrointestinal: abdomen soft, NT, ND, BS+ Musculoskeletal: no deformities, strength intact in all 4 Skin: moist, warm; + 2 acne spots on face, + few dark terminal hair shafts on chin, + vellum on sideburns, no skin tags, + acanthosis nigricans on neck - mild, no purple, wide, stretch marks Neurological: no tremor with outstretched hands, DTR normal in all 4  ASSESSMENT: 1. H/o PCOS  2. Insulin resistance  PLAN: 1.  I had a long discussion with the patient about the fact that the PCOS is a misnomer, a patient does not necessarily have to have polycystic ovaries to be diagnosed with the disorder. This is of sum of several conditions, including:  weight gain  insulin resistance (and therefore a higher risk of developing diabetes later in life)  acne  hirsutism  irregular menstrual cycles  decreased fertility. - We also discussed about the fact that the treatment is usually targeted to addressing the problem that concerns the patient the most: acne/hirsutism, weight gain, or fertility, but there is no single treatment for PCOS.  - The first-line therapy are oral contraceptives. If she is concerned with her weight/insulin resistance, metformin can help (she is on half maximal dose); if she is concerned about acne/hirsutism, we can add OCP's, then spironolactone; and if she is concerned about fertility, I could refer her to  reproductive endocrinology (no plans for a future pregnancy for her) - we discussed at length about a good diet and I suggested a plant-based diet, and possibly, if she cannot do this, then a vegetarian or mediterranean. Given material to help starting this. We set a 10 lb weight loss goal until next visit in 6 mo. - since sugars higher, I sent more test strips to her pharmacy and also increased Metformin to 2000 mg daily (DAW Glucophage XR)  - will check the following labs today (day 5 on menses): Orders Placed This Encounter  Procedures  . HgB A1c  . TSH  . 17-Hydroxyprogesterone  .  Testosterone, free, total  . Estradiol  . Vitamin D (25 hydroxy)  . Androstenedione   March 06, 2013   Levonne Hubert [address] [place] [address]   Dear Ms. Arita Miss,  Below are the results from your recent visit:  TESTOSTERONE, FREE, TOTAL      Result Value Range   Testosterone 29  10 - 70 ng/dL   Sex Hormone Binding 18  18 - 114 nmol/L   Testosterone, Free 7.1 (*) 0.6 - 6.8 pg/mL   Testosterone-% Free 2.4  0.4 - 2.4 %   Narrative:    Performed at:  [facility]                [address], [address]                [place], [place] [address]  ANDROSTENEDIONE      Result Value Range   Androstenedione 74     Narrative:    Performed at:  [facility]                [phone] [facility]                [place], [place] [address]  17-HYDROXYPROGESTERONE      Result Value Range   17-OH-Progesterone, LC/MS/MS 19     Narrative:    Performed at:  [facility]                [phone] [facility] [place],  [address]  ESTRADIOL, FREE      Result Value Range   Estradiol, Free 0.42     Estradiol 18     Results Received 03/06/13     Narrative:    Performed at:  [facility]                [address], [address]                [place], [place] [address]  VITAMIN D 25 HYDROXY      Result Value Range   Vit D, 25-Hydroxy 19 (*) 30 -  89 ng/mL   Narrative:    Performed at:  [facility]                [address], [address]                [place], [place] [address]  HEMOGLOBIN A1C      Result Value Range   Hemoglobin A1C 5.9  4.6 - 6.5 %  TSH      Result Value Range   TSH 0.51  0.35 - 5.50 uIU/mL   The test results show that you have mild PCOS, with a slightly high testosterone level, and I believe Metformin helps with this - you can increase the Metformin by another 500 mg daily - please let me know if you need a refill. I would probably not recommend oral contraceptives right now, but try the higher dose of Metformin first.   You have low vitamin D - I will call in Ergocalciferol (high dose vitamin D) - take 50,000 units WEEKLY for 8 weeks, then continue with 2000 units DAILY over the counter vitamin D3.   If you have any questions or concerns, please don't hesitate to call us.  Sincerely,  Carlus Pavlov, MD

## 2013-02-24 LAB — TESTOSTERONE, FREE, TOTAL, SHBG
Sex Hormone Binding: 18 nmol/L (ref 18–114)
Testosterone, Free: 7.1 pg/mL — ABNORMAL HIGH (ref 0.6–6.8)
Testosterone-% Free: 2.4 % (ref 0.4–2.4)
Testosterone: 29 ng/dL (ref 10–70)

## 2013-03-06 ENCOUNTER — Encounter: Payer: Self-pay | Admitting: Internal Medicine

## 2013-03-06 LAB — ESTRADIOL, FREE
Estradiol, Free: 0.42 pg/mL
Estradiol: 18 pg/mL

## 2013-03-06 MED ORDER — VITAMIN D (ERGOCALCIFEROL) 1.25 MG (50000 UNIT) PO CAPS: 50000.0000 [IU] | ORAL_CAPSULE | ORAL | Status: DC

## 2013-03-07 ENCOUNTER — Telehealth: Payer: Self-pay

## 2013-03-07 NOTE — Telephone Encounter (Signed)
Pt left v/m requesting cb for lab results 02/22/13 by Dr Elvera LennoxGherghe. Sent message to [employer]riv[employer] FinancialShannon Callicutt.

## 2013-03-07 NOTE — Telephone Encounter (Signed)
Called pt and lvm (on cell phone) advising her that Dr Elvera LennoxGherghe had me to mail a letter to her with her results yesterday. I read the results and the note that she put in the letter to the pt (on voice mail) and I advised pt if she had any further questions to please call our office at #[phone].

## 2013-04-05 ENCOUNTER — Ambulatory Visit (INDEPENDENT_AMBULATORY_CARE_PROVIDER_SITE_OTHER): Payer: BC Managed Care – PPO | Admitting: Internal Medicine

## 2013-04-05 ENCOUNTER — Encounter: Payer: Self-pay | Admitting: Internal Medicine

## 2013-04-05 VITALS — BP 106/72 | HR 78 | Temp 97.8°F | Wt 188.0 lb

## 2013-04-05 DIAGNOSIS — J209 Acute bronchitis, unspecified: Secondary | ICD-10-CM

## 2013-04-05 MED ORDER — AZITHROMYCIN 250 MG PO TABS: ORAL_TABLET | ORAL | Status: DC

## 2013-04-05 NOTE — Progress Notes (Signed)
HPI  Pt presents to the clinic today with c/o cough and sore throat. This started 2-3 weeks ago. The cough is productive of thick green/yellow mucous. The cough seems to be worse at night. She does report a burning sensation in her throat. She has taken cough syrup, thera flu and nyquil. This has provided some relief. She has had sick contacts.  Review of Systems      Past Medical History  Diagnosis Date  . PCOS (polycystic ovarian syndrome)     with infertility, followed by [facility]  . Insulin resistance   . Migraines     Family History  Problem Relation Age of Onset  . Bipolar disorder Mother   . Migraines Mother   . Bipolar disorder Brother   . Diabetes Other     History   Social History  . Marital Status: Married    Spouse Name: N/A    Number of Children: N/A  . Years of Education: N/A   Occupational History  . [employer], going back to school    Social History Main Topics  . Smoking status: Never Smoker   . Smokeless tobacco: Not on file  . Alcohol Use: No  . Drug Use: No  . Sexual Activity: Not on file   Other Topics Concern  . Not on file   Social History Narrative  . No narrative on file    No Known Allergies   Constitutional: Positive headache, fatigue. Denies fever or abrupt weight changes.  HEENT:  Positive sore throat. Denies eye redness, eye pain, pressure behind the eyes, facial pain, nasal congestion, ear pain, ringing in the ears, wax buildup, runny nose or bloody nose. Respiratory: Positive cough. Denies difficulty breathing or shortness of breath.  Cardiovascular: Denies chest pain, chest tightness, palpitations or swelling in the hands or feet.   No other specific complaints in a complete review of systems (except as listed in HPI above).  Objective:   BP 106/72  Pulse 78  Temp(Src) 97.8 F (36.6 C) (Oral)  Wt 188 lb (85.276 kg)  SpO2 98% Wt Readings from Last 3 Encounters:  04/05/13 188 lb (85.276 kg)  02/22/13 188 lb 1.9 oz (85.331  kg)  02/09/13 186 lb 12.8 oz (84.732 kg)     General: Appears her stated age, well developed, well nourished in NAD. HEENT: Head: normal shape and size; Eyes: sclera white, no icterus, conjunctiva pink, PERRLA and EOMs intact; Ears: Tm's gray and intact, normal light reflex; Nose: mucosa pink and moist, septum midline; Throat/Mouth: + PND. Teeth present, mucosa erythematous and moist, no exudate noted, no lesions or ulcerations noted.  Neck:  Neck supple, trachea midline. No massses, lumps or thyromegaly present.  Cardiovascular: Normal rate and rhythm. S1,S2 noted.  No murmur, rubs or gallops noted. No JVD or BLE edema. No carotid bruits noted. Pulmonary/Chest: Normal effort and positive vesicular breath sounds. No respiratory distress. No wheezes, rales or ronchi noted.      Assessment & Plan:   Acute Bronchitis:  Get some rest and drink plenty of water Do salt water gargles for the sore throat eRx for Azithromax x 5 days Continue OTC cough suppressant  RTC as needed or if symptoms persist.

## 2013-04-05 NOTE — Progress Notes (Signed)
HPI  Pt presents to the clinic today with c/o cold symptoms x 2 weeks. The worst part is the sore throat and dry cough. She does not produce any sputum. He was running fevers last week but none this week. She has tried Robitussin, thera flu, and nyquil,  and nothing seems to help. The cough is worse at night.  She does have sick contacts.  Review of Systems      Past Medical History  Diagnosis Date  . PCOS (polycystic ovarian syndrome)     with infertility, followed by [facility]  . Insulin resistance   . Migraines     Family History  Problem Relation Age of Onset  . Bipolar disorder Mother   . Migraines Mother   . Bipolar disorder Brother   . Diabetes Other     History   Social History  . Marital Status: Married    Spouse Name: N/A    Number of Children: N/A  . Years of Education: N/A   Occupational History  . [employer], going back to school    Social History Main Topics  . Smoking status: Never Smoker   . Smokeless tobacco: Not on file  . Alcohol Use: No  . Drug Use: No  . Sexual Activity: Not on file   Other Topics Concern  . Not on file   Social History Narrative  . No narrative on file    No Known Allergies   Constitutional: Positive headache, fatigue and fever. Denies abrupt weight changes.  HEENT:  Positive sore throat and runny nose. Denies eye redness, eye pain, pressure behind the eyes, facial pain, nasal congestion, ear pain, ringing in the ears, wax buildup,  or bloody nose. Respiratory: Positive cough. Denies difficulty breathing or shortness of breath.  Cardiovascular: Denies chest pain, chest tightness, palpitations or swelling in the hands or feet.   No other specific complaints in a complete review of systems (except as listed in HPI above).  Objective:   BP 106/72  Pulse 78  Temp(Src) 97.8 F (36.6 C) (Oral)  Wt 188 lb (85.276 kg)  SpO2 98% Wt Readings from Last 3 Encounters:  04/05/13 188 lb (85.276 kg)  02/22/13 188 lb 1.9 oz (85.331  kg)  02/09/13 186 lb 12.8 oz (84.732 kg)     General: Appears his stated age, well developed, well nourished in NAD. HEENT: Head: normal shape and size; Eyes: sclera white, no icterus, conjunctiva pink, PERRLA and EOMs intact; Ears: Tm's gray and intact, normal light reflex; Nose: mucosa pink and moist, septum midline; Throat/Mouth: + PND. Teeth present, mucosa erythematous and moist, no exudate noted, no lesions or ulcerations noted.  Neck: Mild cervical lymphadenopathy. Neck supple, trachea midline. No massses, lumps or thyromegaly present.  Cardiovascular: Normal rate and rhythm. S1,S2 noted.  No murmur, rubs or gallops noted. No JVD or BLE edema. No carotid bruits noted. Pulmonary/Chest: Normal effort and positive vesicular breath sounds. No respiratory distress. No wheezes, rales or ronchi noted.      Assessment & Plan:   Upper Respiratory Infection/Acute Bronchitis, new onset with additional workup required:  Get some rest and drink plenty of water Do salt water gargles for the sore throat eRx for Azithromax x 5 days   RTC as needed or if symptoms persist.  Aren Pryde, Jacques Earthlyourtney S, Student-NP

## 2013-04-05 NOTE — Patient Instructions (Signed)
Acute Bronchitis Bronchitis is inflammation of the airways that extend from the windpipe into the lungs (bronchi). The inflammation often causes mucus to develop. This leads to a cough, which is the most common symptom of bronchitis.  In acute bronchitis, the condition usually develops suddenly and goes away over time, usually in a couple weeks. Smoking, allergies, and asthma can make bronchitis worse. Repeated episodes of bronchitis may cause further lung problems.  CAUSES Acute bronchitis is most often caused by the same virus that causes a cold. The virus can spread from person to person (contagious).  SIGNS AND SYMPTOMS   Cough.   Fever.   Coughing up mucus.   Body aches.   Chest congestion.   Chills.   Shortness of breath.   Sore throat.  DIAGNOSIS  Acute bronchitis is usually diagnosed through a physical exam. Tests, such as chest X-rays, are sometimes done to rule out other conditions.  TREATMENT  Acute bronchitis usually goes away in a couple weeks. Often times, no medical treatment is necessary. Medicines are sometimes given for relief of fever or cough. Antibiotics are usually not needed but may be prescribed in certain situations. In some cases, an inhaler may be recommended to help reduce shortness of breath and control the cough. A cool mist vaporizer may also be used to help thin bronchial secretions and make it easier to clear the chest.  HOME CARE INSTRUCTIONS  Get plenty of rest.   Drink enough fluids to keep your urine clear or pale yellow (unless you have a medical condition that requires fluid restriction). Increasing fluids may help thin your secretions and will prevent dehydration.   Only take over-the-counter or prescription medicines as directed by your health care provider.   Avoid smoking and secondhand smoke. Exposure to cigarette smoke or irritating chemicals will make bronchitis worse. If you are a smoker, consider using nicotine gum or skin  patches to help control withdrawal symptoms. Quitting smoking will help your lungs heal faster.   Reduce the chances of another bout of acute bronchitis by washing your hands frequently, avoiding people with cold symptoms, and trying not to touch your hands to your mouth, nose, or eyes.   Follow up with your health care provider as directed.  SEEK MEDICAL CARE IF: Your symptoms do not improve after 1 week of treatment.  SEEK IMMEDIATE MEDICAL CARE IF:  You develop an increased fever or chills.   You have chest pain.   You have severe shortness of breath.  You have bloody sputum.   You develop dehydration.  You develop fainting.  You develop repeated vomiting.  You develop a severe headache. MAKE SURE YOU:   Understand these instructions.  Will watch your condition.  Will get help right away if you are not doing well or get worse. Document Released: 03/26/2004 Document Revised: 10/19/2012 Document Reviewed: 08/09/2012 Exit[employer] Patient Information 2014 [employer].  

## 2013-04-05 NOTE — Progress Notes (Signed)
Pre-visit discussion using our clinic review tool. No additional management support is needed unless otherwise documented below in the visit note.  

## 2013-06-22 ENCOUNTER — Ambulatory Visit (INDEPENDENT_AMBULATORY_CARE_PROVIDER_SITE_OTHER): Payer: BC Managed Care – PPO | Admitting: Family Medicine

## 2013-06-22 ENCOUNTER — Encounter: Payer: Self-pay | Admitting: Family Medicine

## 2013-06-22 VITALS — BP 98/62 | HR 93 | Temp 97.9°F | Wt 191.2 lb

## 2013-06-22 DIAGNOSIS — J020 Streptococcal pharyngitis: Secondary | ICD-10-CM | POA: Insufficient documentation

## 2013-06-22 DIAGNOSIS — J029 Acute pharyngitis, unspecified: Secondary | ICD-10-CM

## 2013-06-22 DIAGNOSIS — L259 Unspecified contact dermatitis, unspecified cause: Secondary | ICD-10-CM | POA: Insufficient documentation

## 2013-06-22 LAB — POCT RAPID STREP A (OFFICE): Rapid Strep A Screen: POSITIVE — AB

## 2013-06-22 MED ORDER — PENICILLIN V POTASSIUM 500 MG PO TABS
500.0000 mg | ORAL_TABLET | Freq: Three times a day (TID) | ORAL | Status: AC
Start: 2013-06-22 — End: 2013-07-02

## 2013-06-22 MED ORDER — PREDNISONE 20 MG PO TABS: ORAL_TABLET | ORAL | Status: DC

## 2013-06-22 NOTE — Patient Instructions (Signed)
Great to see you. Hang in there.  Hope you and the kids feel better soon.  Take Penicillin as directed- 1 tab three times daily for 10 days. Follow prednisone taper. Follow up as needed.

## 2013-06-22 NOTE — Progress Notes (Signed)
SUBJECTIVE: 33 y.o. female with sore throat, myalgias, swollen glands, headache and fever for 3 days. No history of rheumatic fever. Other symptoms: coryza, congestion, sneezing, sore throat, swollen glands, myalgias and fever. 7818 month old son being treated for strep with amoxicillin.  Also has rash on both arms and areas on both legs.  Was working in the yard over the weekend.  Very itchy.  Not spreading now but did when it first started.  No facial involvement.  No improvement with topical benadryl/hydrocortisone.  Patient Active Problem List   Diagnosis Date Noted  . Strep pharyngitis 06/22/2013  . Contact dermatitis 06/22/2013  . BACK PAIN, LUMBAR 04/30/2010  . POLYCYSTIC OVARIES 12/17/2008  . INSULIN RESISTANCE SYNDROME 12/17/2008   Past Medical History  Diagnosis Date  . PCOS (polycystic ovarian syndrome)     with infertility, followed by [facility]  . Insulin resistance   . Migraines    Past Surgical History  Procedure Laterality Date  . Esophagogastroduodenoscopy     History  Substance Use Topics  . Smoking status: Never Smoker   . Smokeless tobacco: Not on file  . Alcohol Use: No   Family History  Problem Relation Age of Onset  . Bipolar disorder Mother   . Migraines Mother   . Bipolar disorder Brother   . Diabetes Other    No Known Allergies Current Outpatient Prescriptions on File Prior to Visit  Medication Sig Dispense Refill  . glucose blood test strip Use to test blood sugar 2 times daily.  100 each  5  . metFORMIN (GLUCOPHAGE-XR) 500 MG 24 hr tablet Take 1000 mg by mouth 2x daily with meals.  120 tablet  11   No current facility-administered medications on file prior to visit.   The PMH, PSH, Social History, Family History, Medications, and allergies [facility], and have been updated if relevant.   OBJECTIVE:  BP 98/62  Pulse 93  Temp(Src) 97.9 F (36.6 C) (Oral)  Wt 191 lb 4 oz (86.75 kg)  SpO2 95%  Vitals as noted above. Appears  alert, well appearing, and in no distress. Ears: bilateral TM's and external ear canals normal Oropharynx: erythematous and exudate noted Neck: supple, no significant adenopathy Lungs: clear to auscultation, no wheezes, rales or rhonchi, symmetric air entry Rapid Strep test is positive Skin: Multiple linear pustular lesions on lower arms and upper legs bilaterally

## 2013-06-22 NOTE — Assessment & Plan Note (Signed)
Given extensive involvement, will treat with oral prednisone taper. Hopefully this will help with throat pain/swelling as well. Call or return to clinic prn if these symptoms worsen or fail to improve as anticipated. The patient indicates understanding of these issues and agrees with the plan.

## 2013-06-22 NOTE — Progress Notes (Signed)
Pre visit review using our clinic review tool, if applicable. No additional management support is needed unless otherwise documented below in the visit note. 

## 2013-06-22 NOTE — Assessment & Plan Note (Signed)
Patient appears moderately ill.  Exudative pharyngo-tonsillitis is noted.   Ears are normal, chest is clear.  Rapid strep test is positive.  Will treat with PCN 500 mg three times daily x 10 days. Tylenol as needed- NO NSAIDS- since she is going to be taking prednisone as well (see below).

## 2013-07-10 ENCOUNTER — Telehealth: Payer: Self-pay | Admitting: Family Medicine

## 2013-07-10 NOTE — Telephone Encounter (Signed)
Patient said she is not getting better even after taking all of the antibiotic you prescribed for her.

## 2013-07-10 NOTE — Telephone Encounter (Signed)
That was several weeks ago.  Likely needs to be seen again.

## 2013-07-10 NOTE — Telephone Encounter (Signed)
Spoke to pt and advised an OV is required. Appt sched for 05/13 with Dr Milinda Antisower at [phone]

## 2013-07-12 ENCOUNTER — Encounter: Payer: Self-pay | Admitting: Family Medicine

## 2013-07-12 ENCOUNTER — Ambulatory Visit (INDEPENDENT_AMBULATORY_CARE_PROVIDER_SITE_OTHER): Payer: BC Managed Care – PPO | Admitting: Family Medicine

## 2013-07-12 VITALS — BP 110/80 | HR 90 | Temp 98.1°F | Ht 63.0 in | Wt 190.5 lb

## 2013-07-12 DIAGNOSIS — J209 Acute bronchitis, unspecified: Secondary | ICD-10-CM

## 2013-07-12 MED ORDER — HYDROCOD POLST-CHLORPHEN POLST 10-8 MG/5ML PO LQCR: 5.0000 mL | Freq: Every evening | ORAL | Status: DC | PRN

## 2013-07-12 MED ORDER — AZITHROMYCIN 250 MG PO TABS: ORAL_TABLET | ORAL | Status: DC

## 2013-07-12 NOTE — Patient Instructions (Signed)
Take zpak for bronchitis  tussionex for pm cough with caution Fluids and rest  Update if not starting to improve in a week or if worsening

## 2013-07-12 NOTE — Progress Notes (Signed)
Subjective:    Patient ID: Holly Farley, female    DOB: 1980/08/29, 33 y.o.   MRN: [id number]  HPI Here for cough  Was treated for strep throat - 2 weeks ago - penicillin    Now feels like she has a lot of phlegm in her throat   (throat is not sore however) Bad cough - in spasms and she gets out dark green mucous Taking mucinex DM  ? If fever - has been hot and sweaty at work   No rash with strep   She did have poison oak - at the same time as strep - prednisone  Just finished that  Feels swollen in the face and abdomen -not in legs    Patient Active Problem List   Diagnosis Date Noted  . Strep pharyngitis 06/22/2013  . Contact dermatitis 06/22/2013  . POLYCYSTIC OVARIES 12/17/2008  . INSULIN RESISTANCE SYNDROME 12/17/2008   Past Medical History  Diagnosis Date  . PCOS (polycystic ovarian syndrome)     with infertility, followed by [facility]  . Insulin resistance   . Migraines    Past Surgical History  Procedure Laterality Date  . Esophagogastroduodenoscopy     History  Substance Use Topics  . Smoking status: Never Smoker   . Smokeless tobacco: Never Used  . Alcohol Use: No   Family History  Problem Relation Age of Onset  . Bipolar disorder Mother   . Migraines Mother   . Bipolar disorder Brother   . Diabetes Other    No Known Allergies Current Outpatient Prescriptions on File Prior to Visit  Medication Sig Dispense Refill  . glucose blood test strip Use to test blood sugar 2 times daily.  100 each  5  . metFORMIN (GLUCOPHAGE-XR) 500 MG 24 hr tablet Take 1000 mg by mouth 2x daily with meals.  120 tablet  11   No current facility-administered medications on file prior to visit.      Review of Systems Review of Systems  Constitutional: Negative for fever, appetite change,  and unexpected weight change.  ENT pos for cong and rhinorrhea and drip  Eyes: Negative for pain and visual disturbance.  Respiratory: Negative for wheeze  and shortness of breath.     Cardiovascular: Negative for cp or palpitations pos for fluid retention    Gastrointestinal: Negative for nausea, diarrhea and constipation.  Genitourinary: Negative for urgency and frequency.  Skin: Negative for pallor or rash   Neurological: Negative for weakness, light-headedness, numbness and headaches.  Hematological: Negative for adenopathy. Does not bruise/bleed easily.  Psychiatric/Behavioral: Negative for dysphoric mood. The patient is not nervous/anxious.         Objective:   Physical Exam  Constitutional: She appears well-developed and well-nourished. No distress.  HENT:  Head: Normocephalic and atraumatic.  Right Ear: External ear normal.  Left Ear: External ear normal.  Mouth/Throat: Oropharynx is clear and moist.  Nares are injected and congested  No sinus tenderness  No appreciable swelling or facial assymetry Throat clear   Eyes: Conjunctivae and EOM are normal. Pupils are equal, round, and reactive to light. Right eye exhibits no discharge. Left eye exhibits no discharge. No scleral icterus.  Neck: Normal range of motion. Neck supple.  Cardiovascular: Normal rate and regular rhythm.   Pulmonary/Chest: Effort normal and breath sounds normal. No respiratory distress. She has no wheezes. She has no rales.  No crackles  Few scattered rhonchi   Musculoskeletal: She exhibits no edema.  Lymphadenopathy:    She  has no cervical adenopathy.  Neurological: She is alert.  Skin: Skin is warm and dry. No rash noted. No erythema. No pallor.  Psychiatric: She has a normal mood and affect.          Assessment & Plan:

## 2013-07-12 NOTE — Progress Notes (Signed)
Pre visit review using our clinic review tool, if applicable. No additional management support is needed unless otherwise documented below in the visit note. 

## 2013-07-13 NOTE — Assessment & Plan Note (Addendum)
Cover with zithromax  Disc symptomatic care - see instructions on AVS  Update if not starting to improve in a week or if worsening   tussionex for cough with caution of sedation and habit  Suspect the facial swelling that she notices is likely from recent prednisone and that should likely improve but contact us if not

## 2013-09-06 ENCOUNTER — Encounter: Payer: Self-pay | Admitting: Internal Medicine

## 2013-09-06 ENCOUNTER — Telehealth: Payer: Self-pay | Admitting: Internal Medicine

## 2013-09-06 ENCOUNTER — Ambulatory Visit (INDEPENDENT_AMBULATORY_CARE_PROVIDER_SITE_OTHER): Payer: BC Managed Care – PPO | Admitting: Internal Medicine

## 2013-09-06 ENCOUNTER — Other Ambulatory Visit: Payer: Self-pay | Admitting: *Deleted

## 2013-09-06 VITALS — BP 102/60 | HR 82 | Temp 98.5°F | Resp 12 | Wt 193.8 lb

## 2013-09-06 DIAGNOSIS — E282 Polycystic ovarian syndrome: Secondary | ICD-10-CM

## 2013-09-06 DIAGNOSIS — E559 Vitamin D deficiency, unspecified: Secondary | ICD-10-CM

## 2013-09-06 LAB — VITAMIN D 25 HYDROXY (VIT D DEFICIENCY, FRACTURES): VITD: 22.98 ng/mL

## 2013-09-06 LAB — HEMOGLOBIN A1C: Hgb A1c MFr Bld: 6.4 % (ref 4.6–6.5)

## 2013-09-06 MED ORDER — ONETOUCH ULTRA MINI W/DEVICE KIT: PACK | Status: DC

## 2013-09-06 MED ORDER — GLUCOSE BLOOD VI STRP: ORAL_STRIP | Status: DC

## 2013-09-06 MED ORDER — METFORMIN HCL ER 500 MG PO TB24: ORAL_TABLET | ORAL | Status: DC

## 2013-09-06 NOTE — Telephone Encounter (Signed)
[facility] for a new one touch meter

## 2013-09-06 NOTE — Progress Notes (Signed)
Patient ID: Holly Farley, female   DOB: 1980/03/09, 33 y.o.   MRN: [id number]  HPI: Holly Farley is a 33 y.o. female, referred by Nicki Reaperegina Baity, NP, for evaluation for PCOS, dx 2009. Last visit 6 mo ago.  She c/o feeling hot all the time. She is discouraged that she is not losing weight despite a vegetarian diet and increased exercise.  Reviewed hx: Weight gain: - 135 >> 186 lbs in 2008, before pregnancy in 2010 >> 157 (1200 calory diet) >> now 188 lbs - no steroid use - no weight loss meds - on Metformin XR 1000 mg at bedtime - started in 2008 - Meals - previously, before being vegetarian: - Breakfast: 2 scrambled eggs or [place] yoghurt or chicken biscuit - Lunch: chicken breast, salads with ranch, fruit - Dinner: tacos, pizza, grilled chicken, veggies, salad - Snacks: 2: nabs, veggies + ranch dip Drinks:  - Diets tried: many - Exercise: no  Fertility/Menstrual cycles: - irregular menses, every 1-3 months >> regular cycles after last pregnancy until 11/2010 - h/o infertility, during which, she was dx with PCOS - [facility] (tried Clomid, OCP) - + ovarian cysts on TV U/S 03/15/2009 >> small ovarian cysts c/w PCOS; 1 cyst drained - children: 2: 3 and 1 y/o (8 and 9 lbs) - miscarriages: no - contraception: tubal ligation  [facility]  Acne: - before menses or when she is supposed to start a cycle, even if she does not have a cycle; she also gets more craving then   Hirsutism: - minimal, on chin  Tx: - currently on Metformin XR - 1000 mg 2x a day  - sugars in am 100-110 >> recently 140. 2h after a meal: 120 or below >> now 125-126 - did not try Spironolactone - did not try Vaniqua - not on OCPs  Other medical pbs: - insulin resistance sd  She has vitamin D def. Per labs checked at last visit >> started Ergocalciferol (high dose vitamin D) - 50,000 units WEEKLY for 8 weeks, then continue with 2000 units DAILY over the counter vitamin D3. She is  still on the OTC vit D.  - Last thyroid tests: Lab Results  Component Value Date   TSH 0.51 02/22/2013   - Last set of lipids:    Component Value Date/Time   CHOL 170 05/26/2012 0807   TRIG 265.0* 05/26/2012 0807   HDL 47.00 05/26/2012 0807   CHOLHDL 4 05/26/2012 0807   VLDL 53.0* 05/26/2012 0807   LDLCALC 85 01/21/2011 0758   - Last HbA1c: Lab Results  Component Value Date   HGBA1C 5.9 02/22/2013   I reviewed pt's medications, allergies, PMH, social hx, family hx and no changes required, except as mentioned above.  ROS: see HPI + Constitutional: + weight gain, + fatigue, + subjective hyperthermia, + poor sleep, + nocturia , + increased urination Eyes: + blurry vision, no xerophthalmia ENT: no sore throat, no nodules palpated in throat, no dysphagia/odynophagia, no hoarseness Cardiovascular: no CP/+ SOB/no palpitations/+ leg swelling Respiratory: no cough/+ SOB Gastrointestinal: + N/+ V/no D/C, + heartburn Musculoskeletal: + muscle aches /no joint aches Skin: + acne, + easy bruising Neurological: no tremors/numbness/tingling/dizziness, + HA Psychiatric: no depression/anxiety Low libido  PE: BP 102/60  Pulse 82  Temp(Src) 98.5 F (36.9 C) (Oral)  Resp 12  Wt 193 lb 12.8 oz (87.907 kg)  SpO2 98% Wt Readings from Last 3 Encounters:  09/06/13 193 lb 12.8 oz (87.907 kg)  07/12/13  190 lb 8 oz (86.41 kg)  06/22/13 191 lb 4 oz (86.75 kg)   Constitutional: overweight, in NAD, no full supraclavicular fat pads Eyes: PERRLA, EOMI, no exophthalmos ENT: moist mucous membranes, no thyromegaly, no cervical lymphadenopathy Cardiovascular: RRR, No MRG Respiratory: CTA B Gastrointestinal: abdomen soft, NT, ND, BS+ Musculoskeletal: no deformities, strength intact in all 4 Skin: moist, warm; + acne spots on face, + few dark terminal hair shafts on chin, + vellum on sideburns Neurological: no tremor with outstretched hands, DTR normal in all 4  ASSESSMENT: 1. H/o PCOS Component      Latest Ref Rng 02/09/2013 02/22/2013  Testosterone     10 - 70 ng/dL  29  Sex Hormone Binding     18 - 114 nmol/L  18  Testosterone Free     0.6 - 6.8 pg/mL  7.1 (H)  Testosterone-% Free     0.4 - 2.4 %  2.4  Estradiol, Free       0.42  Estradiol       18  Results received       03/06/13  Hemoglobin A1C     4.6 - 6.5 %  5.9  FSH      4.9   LH      8.11   Preg, Serum      NEG   TSH     0.35 - 5.50 uIU/mL  0.51  17-OH-Progesterone, LC/MS/MS       19  Androstenedione       74   2. Vitamin D def - 02/22/2013: vit D 19  PLAN: 1. PCOS Pt doing well from her diet and exercise point of view but discouraged that she is not losing weight - will continue with 1000 mg Metformin XR bid - given more dietary suggestions to help her lose weight - continue exercise - will check testosterone level and Hba1c as she feels her sugars are higher  2. Vitamin D def - we started Ergocalciferol 50,000 units weekly for 8 weeks, then continued with 2000 units daily over the counter vitamin D3.  - will recheck vit D level today  Component     Latest Ref Rng 02/22/2013 09/06/2013  Testosterone     10 - 70 ng/dL 29 55  Sex Hormone Binding     18 - 114 nmol/L 18 20  Testosterone Free     0.6 - 6.8 pg/mL 7.1 (H) 12.9 (H)  Testosterone-% Free     0.4 - 2.4 % 2.4 2.3  Hemoglobin A1C     4.6 - 6.5 % 5.9 6.4  VITD       22.98  Except for the vit D which is a little better, labs worsened: testosterone higher and HbA1c also higher.  Needs to work more on her diet.  Will ask her if she wants to restart OCPs. Will need to have another course of Ergocalciferol 50,000 IU weekly x 8 weeks. Will send Rx.

## 2013-09-06 NOTE — Patient Instructions (Signed)
Please continue Metformin XR 1000 mg 2x a day. Please stop at the lab. Try to fast (only water +/- coffee) one day a week. Try this over the weekend initially.  Please come back for a follow-up appointment in 6 months.

## 2013-09-06 NOTE — Telephone Encounter (Signed)
Pharmacy called requesting a new meter on behalf of the patient. Done.

## 2013-09-07 ENCOUNTER — Encounter: Payer: Self-pay | Admitting: *Deleted

## 2013-09-07 LAB — TESTOSTERONE, FREE, TOTAL, SHBG
Sex Hormone Binding: 20 nmol/L (ref 18–114)
TESTOSTERONE FREE: 12.9 pg/mL — AB (ref 0.6–6.8)
TESTOSTERONE-% FREE: 2.3 % (ref 0.4–2.4)
Testosterone: 55 ng/dL (ref 10–70)

## 2013-09-07 MED ORDER — VITAMIN D (ERGOCALCIFEROL) 1.25 MG (50000 UNIT) PO CAPS: 50000.0000 [IU] | ORAL_CAPSULE | ORAL | Status: DC

## 2013-09-12 ENCOUNTER — Telehealth: Payer: Self-pay | Admitting: Family Medicine

## 2013-09-12 DIAGNOSIS — IMO0001 Reserved for inherently not codable concepts without codable children: Secondary | ICD-10-CM | POA: Insufficient documentation

## 2013-09-12 NOTE — Telephone Encounter (Signed)
Pt called office this morning requesting call back regarding bariatric surgery.  [phone]

## 2013-09-12 NOTE — Telephone Encounter (Signed)
Discussed with pt.  Referral to bariatric surgery referral placed.

## 2013-09-22 ENCOUNTER — Encounter: Payer: Self-pay | Admitting: Family Medicine

## 2013-09-22 ENCOUNTER — Telehealth: Payer: Self-pay | Admitting: Family Medicine

## 2013-09-22 DIAGNOSIS — Z0279 Encounter for issue of other medical certificate: Secondary | ICD-10-CM

## 2013-09-22 NOTE — Telephone Encounter (Signed)
Form placed in Dr Aron's inbox for review and completion 

## 2013-09-22 NOTE — Telephone Encounter (Signed)
Tried calling pt at [phone]; no vm set up. Pt forms + letter up front ready for pick up.

## 2013-09-22 NOTE — Telephone Encounter (Signed)
Pt dropped off form in ref to bariatric surgery to be completed. I have placed this on your desk to pass on to Dr. Dayton MartesAron. Thank you.

## 2013-10-19 ENCOUNTER — Encounter: Payer: Self-pay | Admitting: Family Medicine

## 2013-10-19 ENCOUNTER — Encounter (INDEPENDENT_AMBULATORY_CARE_PROVIDER_SITE_OTHER): Payer: Self-pay

## 2013-10-19 ENCOUNTER — Ambulatory Visit (INDEPENDENT_AMBULATORY_CARE_PROVIDER_SITE_OTHER): Payer: BC Managed Care – PPO | Admitting: Family Medicine

## 2013-10-19 VITALS — BP 114/72 | HR 95 | Temp 98.2°F | Wt 199.5 lb

## 2013-10-19 DIAGNOSIS — J0111 Acute recurrent frontal sinusitis: Secondary | ICD-10-CM

## 2013-10-19 DIAGNOSIS — J0110 Acute frontal sinusitis, unspecified: Secondary | ICD-10-CM

## 2013-10-19 MED ORDER — AMOXICILLIN-POT CLAVULANATE 875-125 MG PO TABS: 1.0000 | ORAL_TABLET | Freq: Two times a day (BID) | ORAL | Status: DC

## 2013-10-19 MED ORDER — FLUTICASONE PROPIONATE 50 MCG/ACT NA SUSP: 2.0000 | Freq: Every day | NASAL | Status: DC

## 2013-10-19 NOTE — Patient Instructions (Signed)
Take antibiotic as directed.  Drink lots of fluids.    Treat sympotmatically with Mucinex, nasal saline irrigation, and Tylenol/Ibuprofen.  Flonase until symptoms resolve.  You can use warm compresses.  Cough suppressant at night.   Call if not improving as expected in 5-7 days.

## 2013-10-19 NOTE — Progress Notes (Signed)
Pre visit review using our clinic review tool, if applicable. No additional management support is needed unless otherwise documented below in the visit note. 

## 2013-10-19 NOTE — Progress Notes (Signed)
SUBJECTIVE:  Holly Farley is a 33 y.o. female who complains of coryza, congestion and bilateral sinus pain for 14 days. She denies a history of anorexia and chest pain and denies a history of asthma. Patient denies smoke cigarettes.   Current Outpatient Prescriptions on File Prior to Visit  Medication Sig Dispense Refill  . Blood Glucose Monitoring Suppl (ONE TOUCH ULTRA MINI) W/DEVICE KIT Use to test blood sugar daily as instructed.  1 each  0  . glucose blood test strip Use to test blood sugar 2 times daily.  100 each  5  . metFORMIN (GLUCOPHAGE-XR) 500 MG 24 hr tablet Take 1000 mg by mouth 2x daily with meals.  120 tablet  11   No current facility-administered medications on file prior to visit.    No Known Allergies  Past Medical History  Diagnosis Date  . PCOS (polycystic ovarian syndrome)     with infertility, followed by [facility]  . Insulin resistance   . Migraines     Past Surgical History  Procedure Laterality Date  . Esophagogastroduodenoscopy      Family History  Problem Relation Age of Onset  . Bipolar disorder Mother   . Migraines Mother   . Bipolar disorder Brother   . Diabetes Other     History   Social History  . Marital Status: Married    Spouse Name: N/A    Number of Children: N/A  . Years of Education: N/A   Occupational History  . [employer], going back to school    Social History Main Topics  . Smoking status: Never Smoker   . Smokeless tobacco: Never Used  . Alcohol Use: No  . Drug Use: No  . Sexual Activity: Not on file   Other Topics Concern  . Not on file   Social History Narrative  . No narrative on file   The PMH, PSH, Social History, Family History, Medications, and allergies have been reviewed in [facility], and have been updated if relevant.  OBJECTIVE:  BP 114/72  Pulse 95  Temp(Src) 98.2 F (36.8 C) (Oral)  Wt 199 lb 8 oz (90.493 kg)  SpO2 97%  LMP 10/16/2013  She appears well, vital signs are as noted. Ears normal.  Throat  and pharynx normal.  Neck supple. No adenopathy in the neck. Nose is congested. Frontal sinuses tender. The chest is clear, without wheezes or rales.  ASSESSMENT:  sinusitis  PLAN: Given duration and progression of symptoms, will treat for bacterial sinusitis with Augmentin. erx also sent in for flonase. Symptomatic therapy suggested: push fluids, rest and return office visit prn if symptoms persist or worsen.  Call or return to clinic prn if these symptoms worsen or fail to improve as anticipated.

## 2013-11-15 ENCOUNTER — Ambulatory Visit (INDEPENDENT_AMBULATORY_CARE_PROVIDER_SITE_OTHER): Payer: Self-pay | Admitting: Surgery

## 2013-11-21 ENCOUNTER — Other Ambulatory Visit (INDEPENDENT_AMBULATORY_CARE_PROVIDER_SITE_OTHER): Payer: Self-pay

## 2013-11-29 ENCOUNTER — Ambulatory Visit (INDEPENDENT_AMBULATORY_CARE_PROVIDER_SITE_OTHER): Payer: Self-pay | Admitting: Family Medicine

## 2013-11-29 VITALS — BP 110/70 | HR 97 | Temp 98.0°F | Resp 20 | Ht 63.0 in | Wt 196.6 lb

## 2013-11-29 DIAGNOSIS — E8881 Metabolic syndrome: Secondary | ICD-10-CM

## 2013-11-29 DIAGNOSIS — Z0289 Encounter for other administrative examinations: Secondary | ICD-10-CM

## 2013-11-29 DIAGNOSIS — E282 Polycystic ovarian syndrome: Secondary | ICD-10-CM

## 2013-11-29 NOTE — Progress Notes (Signed)
Subjective:  This chart was scribed for Delman Cheadle, MD by Ladene Artist, ED Scribe. The patient was seen in room 4. Patient's care was started at 11:37 AM.    Patient ID: Holly Farley, female    DOB: Feb 10, 1981, 32 y.o.   MRN: [id number]  Chief Complaint  Patient presents with   Other    DMV physical   HPI HPI Comments: Holly Farley is a 33 y.o. female, with a h/o DM and migraines, who presents to the [facility] for a DMV physical exam. 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 Diagnosed within a year. She denies h/o DUIs, seizure disorders, or sedatives. Last A1C was 6.9. Pt's Metformin was increased from 1000 to 2000 mg. She reports fasting sugars of 110 in the mornings that is increasing. Pt has tried eating crackers before bed without relief. She reports blurred vision and shakiness with going long periods of time without eating. She denies numbness or tingling in lower extremities. She denies h/o HTN or hyperlipidemia. No recent hospitalizations. Last seen by ophthalmologist in June. 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 She wears corrective lenses. Pt is scheduled to have gastric bypass surgery which she hopes will assist with DM.   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  Past Medical History  Diagnosis Date   PCOS (polycystic ovarian syndrome)     with infertility, followed by [facility]   Insulin resistance    Migraines    Diabetes mellitus without complication    Current Outpatient Prescriptions on File Prior to Visit  Medication Sig Dispense Refill   Blood Glucose Monitoring Suppl (ONE TOUCH ULTRA MINI) W/DEVICE KIT Use to test blood sugar daily as instructed.  1 each  0   fluticasone (FLONASE) 50 MCG/ACT nasal spray Place 2 sprays into both nostrils daily.  16 g  1   glucose blood test strip Use to test blood sugar 2 times daily.  100 each  5   metFORMIN (GLUCOPHAGE-XR) 500 MG 24 hr tablet  Take 1000 mg by mouth 2x daily with meals.  120 tablet  11   No current facility-administered medications on file prior to visit.   No Known Allergies  Review of Systems  Constitutional: Negative for fever, chills, diaphoresis and appetite change.  Eyes: Negative for visual disturbance.  Respiratory: Negative for cough and shortness of breath.   Cardiovascular: Negative for chest pain, palpitations and leg swelling.  Genitourinary: Negative for decreased urine volume.  Neurological: Negative for dizziness, seizures, syncope, weakness, light-headedness, numbness and headaches.  Hematological: Does not bruise/bleed easily.   Triage Vitals: BP 110/70   Pulse 97   Temp(Src) 98 F (36.7 C) (Oral)   Resp 20   Ht 5' 3"  (1.6 m)   Wt 196 lb 9.6 oz (89.177 kg)   BMI 34.83 kg/m2   SpO2 97%   LMP 11/20/2013    Objective:   Physical Exam  Nursing note and vitals reviewed. Constitutional: She is oriented to person, place, and time. She appears well-developed and well-nourished. No distress.  HENT:  Head: Normocephalic and atraumatic.  Eyes: Conjunctivae and EOM are normal.  Neck: Neck supple. No tracheal deviation present.  Cardiovascular: Normal rate, regular rhythm, S1 normal, S2 normal and normal heart sounds.  Exam reveals no gallop and no friction rub.   No murmur heard. Pulmonary/Chest: Effort normal and breath sounds normal. No respiratory distress.  Musculoskeletal: Normal range of motion.  Neurological: She is alert and oriented to person, place, and time.  Skin: Skin is warm and dry.  Psychiatric: She has a normal mood and affect. Her behavior is normal.      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  I personally performed the services described in this documentation, which was scribed in my presence. The  recorded information has been reviewed and considered, and addended by me as needed.  Delman Cheadle, MD MPH

## 2014-03-15 ENCOUNTER — Ambulatory Visit (INDEPENDENT_AMBULATORY_CARE_PROVIDER_SITE_OTHER): Payer: BC Managed Care – PPO | Admitting: Internal Medicine

## 2014-03-15 ENCOUNTER — Encounter: Payer: Self-pay | Admitting: Internal Medicine

## 2014-03-15 VITALS — BP 102/70 | HR 96 | Temp 98.1°F | Wt 198.0 lb

## 2014-03-15 DIAGNOSIS — J0100 Acute maxillary sinusitis, unspecified: Secondary | ICD-10-CM

## 2014-03-15 MED ORDER — HYDROCODONE-HOMATROPINE 5-1.5 MG/5ML PO SYRP: 5.0000 mL | ORAL_SOLUTION | Freq: Three times a day (TID) | ORAL | Status: DC | PRN

## 2014-03-15 MED ORDER — AMOXICILLIN-POT CLAVULANATE 875-125 MG PO TABS: 1.0000 | ORAL_TABLET | Freq: Two times a day (BID) | ORAL | Status: DC

## 2014-03-15 NOTE — Progress Notes (Signed)
Pre visit review using our clinic review tool, if applicable. No additional management support is needed unless otherwise documented below in the visit note. 

## 2014-03-15 NOTE — Progress Notes (Signed)
HPI  Pt presents to the clinic today with c/o headache, facial pain and pressure, nasal congestion and cough. She reports this started 1 week ago. She is blowing green/brown mucous out of his nose. The cough is also productive of green/brown mucous. She has had fever, chills and body aches. She has taken an antihistamine and Mucinex without much relief. She has had sinus infections in the past and reports this feels the same. She has had sick contacts.  Review of Systems    Past Medical History  Diagnosis Date  . PCOS (polycystic ovarian syndrome)     with infertility, followed by [facility]  . Insulin resistance   . Migraines   . Diabetes mellitus without complication     Family History  Problem Relation Age of Onset  . Bipolar disorder Mother   . Migraines Mother   . Bipolar disorder Brother   . Diabetes Other     History   Social History  . Marital Status: Married    Spouse Name: N/A    Number of Children: N/A  . Years of Education: N/A   Occupational History  . [employer], going back to school    Social History Main Topics  . Smoking status: Never Smoker   . Smokeless tobacco: Never Used  . Alcohol Use: No  . Drug Use: No  . Sexual Activity: Not on file   Other Topics Concern  . Not on file   Social History Narrative    No Known Allergies   Constitutional: Positive headache, fatigue and fever. Denies abrupt weight changes.  HEENT:  Positive facial pain, nasal congestion and sore throat. Denies eye redness, ear pain, ringing in the ears, wax buildup, runny nose or bloody nose. Respiratory: Positive cough. Denies difficulty breathing or shortness of breath.  Cardiovascular: Denies chest pain, chest tightness, palpitations or swelling in the hands or feet.   No other specific complaints in a complete review of systems (except as listed in HPI above).  Objective:   BP 102/70 mmHg  Pulse 96  Temp(Src) 98.1 F (36.7 C) (Oral)  Wt 198 lb (89.812 kg)  SpO2  98%   General: Appears her stated age, ill appearing in NAD. HEENT: Head: normal shape and size, maxillary sinus tenderness noted; Eyes: sclera white, no icterus, conjunctiva pink; Ears: Tm's gray and intact, normal light reflex; Nose: mucosa pink and moist, septum midline; Throat/Mouth: + PND. Teeth present, mucosa pink and moist, no exudate noted, no lesions or ulcerations noted.  Neck: No lymphadenopathy noted. Cardiovascular: Normal rate and rhythm. S1,S2 noted.  No murmur, rubs or gallops noted.  Pulmonary/Chest: Normal effort and positive vesicular breath sounds. No respiratory distress. No wheezes, rales or ronchi noted.      Assessment & Plan:   Acute maxillary sinusitis  Can use a Neti Pot which can be purchased from your local drug store. Flonase 2 sprays each nostril for 3 days and then as needed. Augmentin BID for 10 days RX for hycodan cough syrup  RTC as needed or if symptoms persist.

## 2014-03-15 NOTE — Patient Instructions (Signed)

## 2014-03-28 ENCOUNTER — Telehealth: Payer: Self-pay

## 2014-03-28 NOTE — Telephone Encounter (Signed)
Pt left v/m; pt seen 03/15/14; pt has finished abx and still has sinus pressure and sinus area feels swollen, also having sinus drainage that is causing cough at night time and pt is not sleeping well due to cough. Please advise. Pt request cb.

## 2014-03-28 NOTE — Telephone Encounter (Signed)
Is she still using the flonase and has the hycodan provided any relief

## 2014-03-29 MED ORDER — FLUTICASONE PROPIONATE 50 MCG/ACT NA SUSP: 2.0000 | Freq: Every day | NASAL | Status: DC

## 2014-03-29 NOTE — Addendum Note (Signed)
Addended by: Roena MaladyEVONTENNO, Muriah Harsha Y on: 03/29/2014 04:48 PM   Modules accepted: Orders

## 2014-03-29 NOTE — Telephone Encounter (Signed)
Pt states she has not been using flonase and the Hycodan did not work--per verbal order Rene KocherRegina states the pt should try Flonase, Mucinex and/or Sudafed to see if she can get relief--will call back with further instruction if needed

## 2014-06-06 ENCOUNTER — Other Ambulatory Visit: Payer: Self-pay | Admitting: Surgery

## 2014-06-19 NOTE — Op Note (Signed)
PATIENT NAME:  Holly Farley, GOWEN MR#:  [id number] DATE OF BIRTH:  03/20/1980  DATE OF PROCEDURE:  02/29/2012  PREOPERATIVE DIAGNOSES:  1. Advanced cervix and prior fourth degree laceration. 2. 38 + 0 week estimated gestational age. 3. Elective permanent sterilization.  4. Elective primary cesarean section.   POSTOPERATIVE DIAGNOSES:  1. Advanced cervix and prior fourth degree laceration. 2. 38 + 0 week estimated gestational age. 3. Elective permanent sterilization.  4. Elective primary cesarean section.   PROCEDURES:  1. Elective low transverse cesarean section.  2. Elective bilateral tubal ligation, [place].   SURGEON: Suzy Bouchard, MD  ANESTHESIA: Spinal.   DESCRIPTION OF PROCEDURE: After adequate spinal anesthesia, the patient was placed in the dorsal supine position with a hip roll under the right side. The patient received 2 grams IV Ancef prior to commencement of the case. A low transverse uterine incision was made. The fascia was identified in the midline and opened in a transverse fashion. The superior aspect of the fascia was grasped with Kocher clamps and the recti muscles dissected free. The inferior aspect of the fascia was grasped Kocher clamps and the pyramidalis muscle was dissected free. Entry into the peritoneal cavity was accomplished sharply. The vesicouterine peritoneal fold was identified and the bladder flap was created and the bladder was reflected inferiorly. A low transverse uterine incision was made. Upon entry into the endometrial cavity, thick meconium was noted, nuchal cord was reduced and vacuum applied to the fetal occiput and one gentle pull allowed for the delivery of a large fetal head. Shoulders and body were delivered without difficulty. The cord was doubly clamped. No suction of the infant was performed. The infant was passed to pediatrics who were in attendance. They assigned Apgar scores of 9 and 9. Intravenous Pitocin was administered and the  placenta was manually delivered. The uterus was exteriorized and the endometrial cavity was wiped clean with laparotomy tape. A ring forceps was used to open the cervix. This was passed off the operative field. The uterine incision was closed with one chromic suture in a running locking fashion with good approximation of edges. Good hemostasis was noted.   Attention was directed to the patient's right fallopian tube which was grasped at the midportion of the fallopian tube. Two separate 0 plain gut sutures were applied to the fallopian tube and a 1.5 cm portion of the fallopian tube removed. Good hemostasis was noted. A similar procedure was repeated on the patient's left fallopian tube. Two separate 0 plain gut sutures were applied and a 1.5 cm portion of the fallopian tube was removed. The ovaries appeared normal bilaterally.   The posterior cul-de-sac was irrigated and suctioned and the uterus was placed back into the abdominal cavity. The paracolic gutters were then wiped clean with laparotomy tape. The tubal ligation sites were again hemostatic and the uterine incision was hemostatic. The superior aspect of the fascia was again grasped with Coker clamps and the On-Q pump catheters were then placed infraumbilically into a subfascial plane. Once the catheters were in place, the fascia was closed above this with 0 Vicryl suture in a running nonlocking fashion. Subcutaneous tissues were irrigated and bovied for hemostasis. The skin was reapproximated with the Insorb absorbable staples with good cosmetic effect. The On-Q pump catheters were then secured at the skin level with Dermabond and Steri-Strips applied and Tegaderm over top to secure the catheters. Each catheter was loaded with 5 mL of 0.5% Marcaine. There were no complications. Estimated blood loss  600 mL. Intraoperative fluids 1200 mL. The patient did receive 2 grams IV Ancef prior to commencement of the case. The patient was taken to the recovery room  in good condition. ____________________________ Suzy Bouchardhomas J. Schermerhorn, MD tjs:sb D: 02/29/2012 22:13:42 ET T: 03/01/2012 09:30:55 ET JOB#: [id number]  cc: Suzy Bouchardhomas J. Schermerhorn, MD, <Dictator> Suzy BouchardHOMAS J SCHERMERHORN MD ELECTRONICALLY SIGNED 03/01/2012 20:32

## 2014-06-29 ENCOUNTER — Ambulatory Visit (HOSPITAL_COMMUNITY)
Admission: RE | Admit: 2014-06-29 | Discharge: 2014-06-29 | Disposition: A | Discharge status: Home / Self Care | Payer: BC Managed Care – PPO | Source: Ambulatory Visit | Attending: Surgery | Admitting: Surgery

## 2014-06-29 ENCOUNTER — Other Ambulatory Visit: Payer: Self-pay

## 2014-06-29 DIAGNOSIS — E119 Type 2 diabetes mellitus without complications: Secondary | ICD-10-CM | POA: Insufficient documentation

## 2014-06-29 DIAGNOSIS — Z6835 Body mass index (BMI) 35.0-35.9, adult: Secondary | ICD-10-CM | POA: Insufficient documentation

## 2014-06-29 DIAGNOSIS — J40 Bronchitis, not specified as acute or chronic: Secondary | ICD-10-CM | POA: Insufficient documentation

## 2014-06-29 DIAGNOSIS — E282 Polycystic ovarian syndrome: Secondary | ICD-10-CM | POA: Insufficient documentation

## 2014-07-10 NOTE — H&P (Signed)
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 Ddenies leaking fluid, vaginal bleeding, baby is active. Care @ [facility] Type 2 DM currently metformin 500mg  bid. HX PIH, Severe shoulder dystocia with 4th degree extension of episiotomy.    Presents with above    Patient's Medical History Diabetes    Patient's Surgical History none    Medications Pre Natal Vitamins    Allergies NKDA    Social History none    Family History Non-Contributory   ROS:   ROS All systems were reviewed.  HEENT, CNS, GI, GU, Respiratory, CV, Renal and Musculoskeletal systems were found to be normal.   Exam:   Vital Signs stable    Urine Protein to lab negative UTI    General no apparent distress    Mental Status clear    Chest clear    Heart normal sinus rhythm    Abdomen gravid, non-tender    Fundal Height appropriate for 25/6wks    Edema no edema    Pelvic no external lesions, cervix closed and thick, ppoop no vaginal bleeding BOWI    FHT baseline 130's avg variability occ accels    Ucx absent, no uterine activity per pt and EFM once resting quietly   Impression:   Impression 25/6wks ?UTI   Plan:   Plan UA    Comments UA returned normal. VSS AF FHR reactive, no uc's, cx closed per exam. DC PTL precautions and preventatives. Pt has PNC appt @ [facility] 2 weeks.   Electronic Signatures: Albertina ParrLugiano, Alona Danford B (CNM)  (Signed 04-Oct-13 16:40)  Authored: L&D Evaluation   Last Updated: 04-Oct-13 16:40 by Albertina ParrLugiano, Laurabelle Gorczyca B (CNM)

## 2014-07-10 NOTE — H&P (Signed)
L&D Evaluation:  History:   HPI 34 y/o wf G110/18/11    Presents with elevated BP    Patient's Medical History No Chronic Illness    Patient's Surgical History none    Medications Pre Natal Vitamins    Allergies NKDA    Social History none    Family History Non-Contributory   ROS:   ROS All systems were reviewed.  HEENT, CNS, GI, GU, Respiratory, CV, Renal and Musculoskeletal systems were found to be normal.   Exam:   Vital Signs 132/92    Urine Protein negative dipstick    General no apparent distress    Chest clear    Abdomen gravid, non-tender    Estimated Fetal Weight Average for gestational age    Back no CVAT    Edema no edema    Reflexes 2+    Clonus negative    Pelvic 3/90/-2    Mebranes Ruptured, AROM 12:10    Description clear    FHT reactive    FHT Description Variable decelerations    Ucx irregular   Impression:   Impression GDM, PIH   Plan:   Comments EFM, AROM augmentationprnplan for MgS04   Electronic Signatures: Margaretha GlassingEvans, Ricky L (MD)  (Signed 10-Oct-11 12:18)  Entered: L&D Evaluation,  Authored: L&D Evaluation  Last Updated: 10-Oct-11 12:18

## 2014-07-11 ENCOUNTER — Ambulatory Visit: Payer: BC Managed Care – PPO | Admitting: Dietician

## 2014-08-20 ENCOUNTER — Encounter: Payer: Self-pay | Admitting: Dietician

## 2014-08-20 ENCOUNTER — Encounter: Payer: BC Managed Care – PPO | Attending: Surgery | Admitting: Dietician

## 2014-08-20 VITALS — Ht 62.0 in | Wt 184.4 lb

## 2014-08-20 DIAGNOSIS — IMO0001 Reserved for inherently not codable concepts without codable children: Secondary | ICD-10-CM

## 2014-08-20 DIAGNOSIS — Z6835 Body mass index (BMI) 35.0-35.9, adult: Secondary | ICD-10-CM | POA: Diagnosis not present

## 2014-08-20 DIAGNOSIS — Z713 Dietary counseling and surveillance: Secondary | ICD-10-CM | POA: Diagnosis not present

## 2014-08-20 NOTE — Progress Notes (Signed)
  Pre-Op Assessment Visit:  Pre-Operative RYGB Surgery  Medical Nutrition Therapy:  Appt start time: 405   End time:  450  Patient was seen on 08/20/2014 for Pre-Operative Nutrition Assessment. Assessment and letter of approval faxed to [facility] Bariatric Surgery Program coordinator on 08/20/2014.   Preferred Learning Style:   No preference indicated   Learning Readiness:   Ready  Handouts given during visit include:  Pre-Op Goals Bariatric Surgery Protein Shakes   During the appointment today the following Pre-Op Goals were reviewed with the patient: Maintain or lose weight as instructed by your surgeon Make healthy food choices Begin to limit portion sizes Limited concentrated sugars and fried foods Keep fat/sugar in the single digits per serving on   food labels Practice CHEWING your food  (aim for 30 chews per bite or until applesauce consistency) Practice not drinking 15 minutes before, during, and 30 minutes after each meal/snack Avoid all carbonated beverages  Avoid/limit caffeinated beverages  Avoid all sugar-sweetened beverages Consume 3 meals per day; eat every 3-5 hours Make a list of non-food related activities Aim for 64-100 ounces of FLUID daily  Aim for at least 60-80 grams of PROTEIN daily Look for a liquid protein source that contain ?15 g protein and ?5 g carbohydrate  (ex: shakes, drinks, shots)  Patient-Centered Goals: -Energy to play with kids  Demonstrated degree of understanding via:  Teach Back  Teaching Method Utilized:  Visual Auditory Hands on  Barriers to learning/adherence to lifestyle change: none  [facility] to call the [facility] to enroll in Pre-Op and Post-Op Nutrition Education when surgery date is scheduled.

## 2014-08-27 VITALS — Ht 62.0 in | Wt 191.5 lb

## 2014-08-27 DIAGNOSIS — IMO0001 Reserved for inherently not codable concepts without codable children: Secondary | ICD-10-CM

## 2014-08-28 NOTE — Progress Notes (Signed)
  Pre-Operative Nutrition Class:  Appt start time: 5940   End time:  1830.  Patient was seen on 08/27/14 for Pre-Operative Bariatric Surgery Education at the [facility].   Surgery date:  Surgery type: RYGB Start weight at [facility]: 184 lbs on 08/20/14 Weight today: 191.5 lbs  TANITA  BODY COMP RESULTS  08/27/14   BMI (kg/m^2) 35   Fat Mass (lbs) 84   Fat Free Mass (lbs) 107.5   Total Body Water (lbs) 78.5   Samples given per MNT protocol. Patient educated on appropriate usage: Celebrate Multivitamin chew (berry - qty 1) Lot #: [id number] Exp: 04/2016  Celebrate Calcium Citrate chew (chocolate - qty 1) Lot #: [id number] Exp: 04/2016  Premier protein shake (vanilla - qty 1) Lot #: [id number] Exp: 03/2015  Renee Pain Protein Powder (unflavored - qty 1) Lot #: [id number] Exp: 08/2015  The following the learning objectives were met by the patient during this course:  Identify Pre-Op Dietary Goals and will begin 2 weeks pre-operatively  Identify appropriate sources of fluids and proteins   State protein recommendations and appropriate sources pre and post-operatively  Identify Post-Operative Dietary Goals and will follow for 2 weeks post-operatively  Identify appropriate multivitamin and calcium sources  Describe the need for physical activity post-operatively and will follow MD recommendations  State when to call healthcare provider regarding medication questions or post-operative complications  Handouts given during class include:  Pre-Op Bariatric Surgery Diet Handout  Protein Shake Handout  Post-Op Bariatric Surgery Nutrition Handout  [facility] Program Information Flyer  Support Group Information Flyer  [facility] Bariatric Supplements Price List  Follow-Up Plan: Patient will follow-up at [facility] 2 weeks post operatively for diet advancement per MD.

## 2014-09-10 NOTE — Progress Notes (Signed)
Please put orders in Epic surgery 09-24-14 pre op 09-17-14 Thanks

## 2014-09-12 ENCOUNTER — Ambulatory Visit: Payer: Self-pay | Admitting: Surgery

## 2014-09-12 NOTE — H&P (Signed)
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  She has decided to pursue roux en y gastric bypass.  Her questions have been answered.  Seen in the office and lab reviewed and HgA1C is at 10.  She is ready to undergo roux y gastric bypass.    Past Medical History  Diagnosis Date  . PCOS (polycystic ovarian syndrome)     with infertility, followed by [facility]  . Insulin resistance   . Migraines   . Diabetes mellitus without complication     Past Surgical History  Procedure Laterality Date  . Esophagogastroduodenoscopy    . Cesarean section    . Tubal ligation      Current Outpatient Prescriptions  Medication Sig Dispense Refill  . amoxicillin-clavulanate (AUGMENTIN) 875-125 MG per tablet Take 1 tablet by mouth 2 (two) times daily. 20 tablet 0  . Blood Glucose Monitoring Suppl (ONE TOUCH ULTRA MINI) W/DEVICE KIT Use to test blood sugar daily as instructed. 1 each 0  . fluticasone (FLONASE) 50 MCG/ACT nasal spray Place 2 sprays into both nostrils daily. 16 g 1  . glucose blood test strip Use to test blood sugar 2 times daily. 100 each 5  . HYDROcodone-homatropine (HYCODAN) 5-1.5 MG/5ML syrup Take 5 mLs by mouth every 8 (eight) hours as needed for cough. 120 mL 0  . metFORMIN (GLUCOPHAGE-XR) 500 MG 24 hr tablet Take 1000 mg by mouth 2x daily with meals. 120 tablet 11   No current facility-administered medications for this visit.   Review of patient's allergies indicates no known allergies. Family History  Problem Relation Age of Onset  . Bipolar disorder Mother   . Migraines Mother   . Bipolar disorder Brother   . Diabetes Other    Social History:   reports that she has never smoked. She has never used smokeless tobacco. She reports that she does not drink alcohol or use illicit drugs.   REVIEW OF SYSTEMS : Negative  except for see problem list  Physical Exam:   There were no vitals taken for this visit. There is no weight on file to calculate BMI.  Gen:  WDWN WF NAD  Neurological: Alert and oriented to person, place, and time. Motor and sensory function is grossly intact  Head: Normocephalic and atraumatic.  Eyes: Conjunctivae are normal. Pupils are equal, round, and reactive to light. No scleral icterus.  Neck: Normal range of motion. Neck supple. No tracheal deviation or thyromegaly present.  Cardiovascular:  SR without murmurs or gallops.  No carotid bruits Breast:  Not examined Respiratory: Effort normal.  No respiratory distress. No chest wall tenderness. Breath sounds normal.  No wheezes, rales or rhonchi.  Abdomen:  nontender GU:  Not examined Musculoskeletal: Normal range of motion. Extremities are nontender. No cyanosis, edema or clubbing noted Lymphadenopathy: No cervical, preauricular, postauricular or axillary adenopathy is present Skin: Skin is warm and dry. No rash noted. No diaphoresis. No erythema. No pallor. Pscyh: Normal mood and affect. Behavior is normal. Judgment and thought content normal.   LABORATORY RESULTS: No results found for this or any previous visit (from the past 48 hour(s)).   RADIOLOGY RESULTS: No results found.  Problem List: Patient Active Problem List   Diagnosis Date Noted  . Obesity, Class II, BMI 35-39.9, with comorbidity 09/12/2013  . Acute bronchitis 07/12/2013  . Strep   pharyngitis 06/22/2013  . Contact dermatitis 06/22/2013  . Polycystic ovaries 12/17/2008  . INSULIN RESISTANCE SYNDROME 12/17/2008    Assessment & Plan: DM and morbid obesity;  For roux y gastric bypass to help control difficult type II DM    Matt B. Wasyl Dornfeld, MD, FACS  [facility] [phone] beeper [phone]  09/12/2014 5:06 PM      

## 2014-09-13 NOTE — Patient Instructions (Addendum)
Holly Farley MissDawson  09/13/2014   Your procedure is scheduled on:    09/24/2014    [facility]  Entrance [place]  elevators to 3rd floor to  Short Stay Center at     0815 AM.  Call this number if you have problems the morning of surgery [phone]   Remember: ONLY 1 PERSON MAY GO WITH YOU TO SHORT STAY TO GET  READY MORNING OF YOUR SURGERY.  Do not eat food or drink liquids :After Midnight.     Take these medicines the morning of surgery with A SIP OF WATER:  None                                You may not have any metal on your body including hair pins and              piercings  Do not wear jewelry, make-up, lotions, powders or perfumes, deodorant             Do not wear nail polish.  Do not shave  48 hours prior to surgery.              .   Do not bring valuables to the hospital.  IS NOT             RESPONSIBLE   FOR VALUABLES.  Contacts, dentures or bridgework may not be worn into surgery.  Leave suitcase in the car. After surgery it may be brought to your room.     Special Instructions: coughing and deep breathing exercises, leg exercises               Please read over the following fact sheets you were given: _____________________________________________________________________             [facility] - Preparing for Surgery Before surgery, you can play an important role.  Because skin is not sterile, your skin needs to be as free of germs as possible.  You can reduce the number of germs on your skin by washing with CHG (chlorahexidine gluconate) soap before surgery.  CHG is an antiseptic cleaner which kills germs and bonds with the skin to continue killing germs even after washing. Please DO NOT use if you have an allergy to CHG or antibacterial soaps.  If your skin becomes reddened/irritated stop using the CHG and inform your nurse when you arrive at Short Stay. Do not shave (including legs and underarms) for at least 48  hours prior to the first CHG shower.  You may shave your face/neck. Please follow these instructions carefully:  1.  Shower with CHG Soap the night before surgery and the  morning of Surgery.  2.  If you choose to wash your hair, wash your hair first as usual with your  normal  shampoo.  3.  After you shampoo, rinse your hair and body thoroughly to remove the  shampoo.                           4.  Use CHG as you would any other liquid soap.  You can apply chg directly  to the skin and wash  Gently with a scrungie or clean washcloth.  5.  Apply the CHG Soap to your body ONLY FROM THE NECK DOWN.   Do not use on face/ open                           Wound or open sores. Avoid contact with eyes, ears mouth and genitals (private parts).                       Wash face,  Genitals (private parts) with your normal soap.             6.  Wash thoroughly, paying special attention to the area where your surgery  will be performed.  7.  Thoroughly rinse your body with warm water from the neck down.  8.  DO NOT shower/wash with your normal soap after using and rinsing off  the CHG Soap.                9.  Pat yourself dry with a clean towel.            10.  Wear clean pajamas.            11.  Place clean sheets on your bed the night of your first shower and do not  sleep with pets. Day of Surgery : Do not apply any lotions/deodorants the morning of surgery.  Please wear clean clothes to the hospital/surgery center.  FAILURE TO FOLLOW THESE INSTRUCTIONS MAY RESULT IN THE CANCELLATION OF YOUR SURGERY PATIENT SIGNATURE_________________________________  NURSE SIGNATURE__________________________________  ________________________________________________________________________

## 2014-09-17 ENCOUNTER — Encounter (HOSPITAL_COMMUNITY): Payer: Self-pay

## 2014-09-17 ENCOUNTER — Encounter (HOSPITAL_COMMUNITY)
Admission: RE | Admit: 2014-09-17 | Discharge: 2014-09-17 | Disposition: A | Discharge status: Home / Self Care | Payer: BC Managed Care – PPO | Source: Ambulatory Visit | Attending: Surgery | Admitting: Surgery

## 2014-09-17 DIAGNOSIS — E119 Type 2 diabetes mellitus without complications: Secondary | ICD-10-CM | POA: Diagnosis not present

## 2014-09-17 DIAGNOSIS — Z01812 Encounter for preprocedural laboratory examination: Secondary | ICD-10-CM | POA: Insufficient documentation

## 2014-09-17 LAB — CBC WITH DIFFERENTIAL/PLATELET
Basophils Absolute: 0 10*3/uL (ref 0.0–0.1)
Basophils Relative: 0 % (ref 0–1)
EOS ABS: 0.1 10*3/uL (ref 0.0–0.7)
Eosinophils Relative: 2 % (ref 0–5)
HCT: 41.2 % (ref 36.0–46.0)
Hemoglobin: 14.4 g/dL (ref 12.0–15.0)
LYMPHS ABS: 2.1 10*3/uL (ref 0.7–4.0)
Lymphocytes Relative: 28 % (ref 12–46)
MCH: 29.6 pg (ref 26.0–34.0)
MCHC: 35 g/dL (ref 30.0–36.0)
MCV: 84.8 fL (ref 78.0–100.0)
MONO ABS: 0.5 10*3/uL (ref 0.1–1.0)
Monocytes Relative: 7 % (ref 3–12)
NEUTROS ABS: 4.6 10*3/uL (ref 1.7–7.7)
Neutrophils Relative %: 63 % (ref 43–77)
Platelets: 253 10*3/uL (ref 150–400)
RBC: 4.86 MIL/uL (ref 3.87–5.11)
RDW: 12.3 % (ref 11.5–15.5)
WBC: 7.4 10*3/uL (ref 4.0–10.5)

## 2014-09-17 LAB — COMPREHENSIVE METABOLIC PANEL
ALBUMIN: 4.4 g/dL (ref 3.5–5.0)
ALK PHOS: 90 U/L (ref 38–126)
ALT: 50 U/L (ref 14–54)
AST: 42 U/L — ABNORMAL HIGH (ref 15–41)
Anion gap: 8 (ref 5–15)
BILIRUBIN TOTAL: 1.1 mg/dL (ref 0.3–1.2)
BUN: 11 mg/dL (ref 6–20)
CALCIUM: 9.2 mg/dL (ref 8.9–10.3)
CO2: 24 mmol/L (ref 22–32)
Chloride: 100 mmol/L — ABNORMAL LOW (ref 101–111)
Creatinine, Ser: 0.7 mg/dL (ref 0.44–1.00)
GFR calc Af Amer: >60 mL/min (ref >60)
GFR calc non Af Amer: >60 mL/min (ref >60)
GLUCOSE: 306 mg/dL — AB (ref 65–99)
Potassium: 4.3 mmol/L (ref 3.5–5.1)
Sodium: 132 mmol/L — ABNORMAL LOW (ref 135–145)
Total Protein: 8.1 g/dL (ref 6.5–8.1)

## 2014-09-17 LAB — HCG, SERUM, QUALITATIVE: PREG SERUM: NEGATIVE

## 2014-09-17 NOTE — Progress Notes (Signed)
CMP done 09/17/2014 faxed via EPIC to Dr Daphine DeutscherMartin.

## 2014-09-17 NOTE — Progress Notes (Signed)
EKG 06/29/2014 and CXR 06/29/14 in EPIC.

## 2014-09-24 ENCOUNTER — Encounter (HOSPITAL_COMMUNITY): Payer: Self-pay | Admitting: *Deleted

## 2014-09-24 ENCOUNTER — Inpatient Hospital Stay (HOSPITAL_COMMUNITY)
Admission: RE | Admit: 2014-09-24 | Discharge: 2014-09-26 | DRG: 621 | Disposition: A | Discharge status: Home / Self Care | Payer: BC Managed Care – PPO | Source: Ambulatory Visit | Attending: Surgery | Admitting: Surgery

## 2014-09-24 ENCOUNTER — Encounter (HOSPITAL_COMMUNITY)
Admission: RE | Disposition: A | Discharge status: Home / Self Care | Payer: Self-pay | Source: Ambulatory Visit | Attending: Surgery

## 2014-09-24 ENCOUNTER — Inpatient Hospital Stay (HOSPITAL_COMMUNITY): Payer: BC Managed Care – PPO | Admitting: Anesthesiology

## 2014-09-24 DIAGNOSIS — E1165 Type 2 diabetes mellitus with hyperglycemia: Secondary | ICD-10-CM | POA: Diagnosis present

## 2014-09-24 DIAGNOSIS — Z6833 Body mass index (BMI) 33.0-33.9, adult: Secondary | ICD-10-CM | POA: Diagnosis not present

## 2014-09-24 DIAGNOSIS — Z833 Family history of diabetes mellitus: Secondary | ICD-10-CM | POA: Diagnosis not present

## 2014-09-24 DIAGNOSIS — Z01812 Encounter for preprocedural laboratory examination: Secondary | ICD-10-CM

## 2014-09-24 DIAGNOSIS — Z79899 Other long term (current) drug therapy: Secondary | ICD-10-CM

## 2014-09-24 DIAGNOSIS — Z818 Family history of other mental and behavioral disorders: Secondary | ICD-10-CM | POA: Diagnosis not present

## 2014-09-24 DIAGNOSIS — E8881 Metabolic syndrome: Secondary | ICD-10-CM | POA: Diagnosis present

## 2014-09-24 DIAGNOSIS — Z9884 Bariatric surgery status: Secondary | ICD-10-CM

## 2014-09-24 HISTORY — PX: GASTRIC ROUX-EN-Y: SHX5262

## 2014-09-24 LAB — CBC
HCT: 38.9 % (ref 36.0–46.0)
HEMOGLOBIN: 13.3 g/dL (ref 12.0–15.0)
MCH: 29.6 pg (ref 26.0–34.0)
MCHC: 34.2 g/dL (ref 30.0–36.0)
MCV: 86.4 fL (ref 78.0–100.0)
Platelets: 243 10*3/uL (ref 150–400)
RBC: 4.5 MIL/uL (ref 3.87–5.11)
RDW: 12.6 % (ref 11.5–15.5)
WBC: 14.6 10*3/uL — AB (ref 4.0–10.5)

## 2014-09-24 LAB — GLUCOSE, CAPILLARY
GLUCOSE-CAPILLARY: 335 mg/dL — AB (ref 65–99)
Glucose-Capillary: 286 mg/dL — ABNORMAL HIGH (ref 65–99)
Glucose-Capillary: 361 mg/dL — ABNORMAL HIGH (ref 65–99)
Glucose-Capillary: 290 mg/dL — ABNORMAL HIGH (ref 65–99)
Glucose-Capillary: 252 mg/dL — ABNORMAL HIGH (ref 65–99)

## 2014-09-24 LAB — CREATININE, SERUM
Creatinine, Ser: 0.71 mg/dL (ref 0.44–1.00)
GFR calc non Af Amer: >60 mL/min (ref >60)

## 2014-09-24 LAB — HEMOGLOBIN AND HEMATOCRIT, BLOOD
HCT: 40 % (ref 36.0–46.0)
Hemoglobin: 13.5 g/dL (ref 12.0–15.0)

## 2014-09-24 LAB — PREGNANCY, URINE: Preg Test, Ur: NEGATIVE

## 2014-09-24 SURGERY — LAPAROSCOPIC ROUX-EN-Y GASTRIC BYPASS WITH UPPER ENDOSCOPY
Anesthesia: General | Site: Abdomen

## 2014-09-24 MED ORDER — DEXAMETHASONE SODIUM PHOSPHATE 10 MG/ML IJ SOLN
INTRAMUSCULAR | Status: AC
  Filled 2014-09-24: qty 1

## 2014-09-24 MED ORDER — DEXTROSE 5 % IV SOLN
INTRAVENOUS | Status: AC
  Filled 2014-09-24: qty 2

## 2014-09-24 MED ORDER — ROCURONIUM BROMIDE 100 MG/10ML IV SOLN
INTRAVENOUS | Status: AC
  Filled 2014-09-24: qty 1

## 2014-09-24 MED ORDER — INSULIN ASPART 100 UNIT/ML ~~LOC~~ SOLN
0.0000 [IU] | SUBCUTANEOUS | Status: DC
  Administered 2014-09-24: 7 [IU] via SUBCUTANEOUS
  Administered 2014-09-24 – 2014-09-25 (×2): 11 [IU] via SUBCUTANEOUS
  Administered 2014-09-25 (×2): 7 [IU] via SUBCUTANEOUS
  Administered 2014-09-25: 11 [IU] via SUBCUTANEOUS
  Administered 2014-09-25 (×2): 7 [IU] via SUBCUTANEOUS
  Administered 2014-09-26: 11 [IU] via SUBCUTANEOUS
  Administered 2014-09-26 (×3): 7 [IU] via SUBCUTANEOUS

## 2014-09-24 MED ORDER — PHENYLEPHRINE HCL 10 MG/ML IJ SOLN
INTRAMUSCULAR | Status: DC | PRN
  Administered 2014-09-24: 80 ug via INTRAVENOUS
  Administered 2014-09-24: 120 ug via INTRAVENOUS
  Administered 2014-09-24: 80 ug via INTRAVENOUS
  Administered 2014-09-24 (×2): 120 ug via INTRAVENOUS
  Administered 2014-09-24: 80 ug via INTRAVENOUS

## 2014-09-24 MED ORDER — GLYCOPYRROLATE 0.2 MG/ML IJ SOLN
INTRAMUSCULAR | Status: AC
  Filled 2014-09-24: qty 3

## 2014-09-24 MED ORDER — ROCURONIUM BROMIDE 100 MG/10ML IV SOLN
INTRAVENOUS | Status: DC | PRN
  Administered 2014-09-24 (×2): 20 mg via INTRAVENOUS
  Administered 2014-09-24 (×2): 10 mg via INTRAVENOUS
  Administered 2014-09-24: 20 mg via INTRAVENOUS
  Administered 2014-09-24: 50 mg via INTRAVENOUS
  Administered 2014-09-24 (×2): 10 mg via INTRAVENOUS

## 2014-09-24 MED ORDER — LACTATED RINGERS IV SOLN
INTRAVENOUS | Status: DC
  Administered 2014-09-24: 11:00:00 via INTRAVENOUS
  Administered 2014-09-24: 1000 mL via INTRAVENOUS
  Administered 2014-09-24: 12:00:00 via INTRAVENOUS

## 2014-09-24 MED ORDER — BUPIVACAINE LIPOSOME 1.3 % IJ SUSP
20.0000 mL | Freq: Once | INTRAMUSCULAR | Status: AC
  Administered 2014-09-24: 20 mL
  Filled 2014-09-24: qty 20

## 2014-09-24 MED ORDER — INSULIN ASPART 100 UNIT/ML ~~LOC~~ SOLN
SUBCUTANEOUS | Status: AC
  Filled 2014-09-24: qty 1

## 2014-09-24 MED ORDER — ONDANSETRON HCL 4 MG/2ML IJ SOLN
4.0000 mg | INTRAMUSCULAR | Status: DC | PRN
  Administered 2014-09-24: 4 mg via INTRAVENOUS
  Filled 2014-09-24: qty 2

## 2014-09-24 MED ORDER — ACETAMINOPHEN 160 MG/5ML PO SOLN: 650.0000 mg | ORAL | Status: DC | PRN

## 2014-09-24 MED ORDER — TISSEEL VH 10 ML EX KIT
PACK | CUTANEOUS | Status: DC | PRN
  Administered 2014-09-24: 10 mL

## 2014-09-24 MED ORDER — KCL IN DEXTROSE-NACL 20-5-0.45 MEQ/L-%-% IV SOLN
INTRAVENOUS | Status: DC
  Administered 2014-09-24 – 2014-09-25 (×2): via INTRAVENOUS
  Administered 2014-09-25 (×2): 1000 mL via INTRAVENOUS
  Administered 2014-09-26 (×2): via INTRAVENOUS
  Filled 2014-09-24 (×10): qty 1000

## 2014-09-24 MED ORDER — FENTANYL CITRATE (PF) 100 MCG/2ML IJ SOLN
INTRAMUSCULAR | Status: AC
  Filled 2014-09-24: qty 2

## 2014-09-24 MED ORDER — NEOSTIGMINE METHYLSULFATE 10 MG/10ML IV SOLN
INTRAVENOUS | Status: DC | PRN
  Administered 2014-09-24: 4 mg via INTRAVENOUS

## 2014-09-24 MED ORDER — CETYLPYRIDINIUM CHLORIDE 0.05 % MT LIQD
7.0000 mL | Freq: Two times a day (BID) | OROMUCOSAL | Status: DC
  Administered 2014-09-24 – 2014-09-25 (×3): 7 mL via OROMUCOSAL

## 2014-09-24 MED ORDER — HYDROMORPHONE HCL 2 MG/ML IJ SOLN
INTRAMUSCULAR | Status: AC
  Filled 2014-09-24: qty 1

## 2014-09-24 MED ORDER — ONDANSETRON HCL 4 MG/2ML IJ SOLN
INTRAMUSCULAR | Status: DC | PRN
  Administered 2014-09-24: 4 mg via INTRAVENOUS

## 2014-09-24 MED ORDER — HEPARIN SODIUM (PORCINE) 5000 UNIT/ML IJ SOLN
5000.0000 [IU] | INTRAMUSCULAR | Status: AC
  Administered 2014-09-24: 5000 [IU] via SUBCUTANEOUS
  Filled 2014-09-24: qty 1

## 2014-09-24 MED ORDER — DEXAMETHASONE SODIUM PHOSPHATE 10 MG/ML IJ SOLN
INTRAMUSCULAR | Status: DC | PRN
  Administered 2014-09-24: 10 mg via INTRAVENOUS

## 2014-09-24 MED ORDER — HYDROMORPHONE HCL 1 MG/ML IJ SOLN: 0.2500 mg | INTRAMUSCULAR | Status: DC | PRN

## 2014-09-24 MED ORDER — LIDOCAINE HCL (CARDIAC) 20 MG/ML IV SOLN
INTRAVENOUS | Status: DC | PRN
  Administered 2014-09-24: 100 mg via INTRAVENOUS

## 2014-09-24 MED ORDER — TISSEEL VH 10 ML EX KIT
PACK | CUTANEOUS | Status: AC
  Filled 2014-09-24: qty 1

## 2014-09-24 MED ORDER — NEOSTIGMINE METHYLSULFATE 10 MG/10ML IV SOLN
INTRAVENOUS | Status: AC
  Filled 2014-09-24: qty 1

## 2014-09-24 MED ORDER — UNJURY CHICKEN SOUP POWDER: 2.0000 [oz_av] | Freq: Four times a day (QID) | ORAL | Status: DC

## 2014-09-24 MED ORDER — PHENYLEPHRINE HCL 10 MG/ML IJ SOLN
INTRAMUSCULAR | Status: AC
  Filled 2014-09-24: qty 1

## 2014-09-24 MED ORDER — GLYCOPYRROLATE 0.2 MG/ML IJ SOLN
INTRAMUSCULAR | Status: DC | PRN
  Administered 2014-09-24: .6 mg via INTRAVENOUS

## 2014-09-24 MED ORDER — PROPOFOL 10 MG/ML IV BOLUS
INTRAVENOUS | Status: AC
  Filled 2014-09-24: qty 20

## 2014-09-24 MED ORDER — ONDANSETRON HCL 4 MG/2ML IJ SOLN
INTRAMUSCULAR | Status: AC
  Filled 2014-09-24: qty 2

## 2014-09-24 MED ORDER — UNJURY CHOCOLATE CLASSIC POWDER
2.0000 [oz_av] | Freq: Four times a day (QID) | ORAL | Status: DC
  Administered 2014-09-26: 2 [oz_av] via ORAL

## 2014-09-24 MED ORDER — INSULIN ASPART 100 UNIT/ML ~~LOC~~ SOLN
12.0000 [IU] | Freq: Once | SUBCUTANEOUS | Status: AC
  Administered 2014-09-24: 12 [IU] via SUBCUTANEOUS

## 2014-09-24 MED ORDER — HEPARIN SODIUM (PORCINE) 5000 UNIT/ML IJ SOLN
5000.0000 [IU] | Freq: Three times a day (TID) | INTRAMUSCULAR | Status: DC
  Administered 2014-09-24 – 2014-09-26 (×5): 5000 [IU] via SUBCUTANEOUS
  Filled 2014-09-24 (×8): qty 1

## 2014-09-24 MED ORDER — ACETAMINOPHEN 10 MG/ML IV SOLN
1000.0000 mg | Freq: Once | INTRAVENOUS | Status: AC
  Administered 2014-09-25: 1000 mg via INTRAVENOUS
  Filled 2014-09-24: qty 100

## 2014-09-24 MED ORDER — LIDOCAINE HCL (CARDIAC) 20 MG/ML IV SOLN
INTRAVENOUS | Status: AC
  Filled 2014-09-24: qty 5

## 2014-09-24 MED ORDER — FENTANYL CITRATE (PF) 100 MCG/2ML IJ SOLN
INTRAMUSCULAR | Status: DC | PRN
  Administered 2014-09-24: 100 ug via INTRAVENOUS

## 2014-09-24 MED ORDER — PROPOFOL 10 MG/ML IV BOLUS
INTRAVENOUS | Status: DC | PRN
  Administered 2014-09-24: 200 mg via INTRAVENOUS

## 2014-09-24 MED ORDER — MIDAZOLAM HCL 5 MG/5ML IJ SOLN
INTRAMUSCULAR | Status: DC | PRN
  Administered 2014-09-24: 2 mg via INTRAVENOUS

## 2014-09-24 MED ORDER — CEFOXITIN SODIUM 2 G IV SOLR
2.0000 g | INTRAVENOUS | Status: AC
  Administered 2014-09-24 (×2): 2 g via INTRAVENOUS

## 2014-09-24 MED ORDER — MIDAZOLAM HCL 2 MG/2ML IJ SOLN
INTRAMUSCULAR | Status: AC
Start: 2014-09-24 — End: 2014-09-24
  Filled 2014-09-24: qty 2

## 2014-09-24 MED ORDER — MORPHINE SULFATE 2 MG/ML IJ SOLN
2.0000 mg | INTRAMUSCULAR | Status: DC | PRN
  Administered 2014-09-24 (×3): 2 mg via INTRAVENOUS
  Filled 2014-09-24 (×3): qty 1

## 2014-09-24 MED ORDER — OXYCODONE HCL 5 MG/5ML PO SOLN
5.0000 mg | ORAL | Status: DC | PRN
  Administered 2014-09-25 – 2014-09-26 (×3): 10 mg via ORAL
  Filled 2014-09-24 (×3): qty 10

## 2014-09-24 MED ORDER — UNJURY VANILLA POWDER: 2.0000 [oz_av] | Freq: Four times a day (QID) | ORAL | Status: DC

## 2014-09-24 MED ORDER — ACETAMINOPHEN 160 MG/5ML PO SOLN: 325.0000 mg | ORAL | Status: DC | PRN

## 2014-09-24 MED ORDER — HYDROMORPHONE HCL 1 MG/ML IJ SOLN
INTRAMUSCULAR | Status: DC | PRN
  Administered 2014-09-24: .5 mg via INTRAVENOUS
  Administered 2014-09-24: 0.5 mg via INTRAVENOUS
  Administered 2014-09-24 (×2): .5 mg via INTRAVENOUS
  Administered 2014-09-24 (×3): 0.5 mg via INTRAVENOUS

## 2014-09-24 SURGICAL SUPPLY — 70 items
APPLICATOR COTTON TIP 6IN STRL (MISCELLANEOUS) ×6 IMPLANT
APPLIER CLIP ROT 10 11.4 M/L (STAPLE)
APPLIER CLIP ROT 13.4 12 LRG (CLIP)
BENZOIN TINCTURE PRP APPL 2/3 (GAUZE/BANDAGES/DRESSINGS) IMPLANT
BLADE SURG 15 STRL LF DISP TIS (BLADE) ×1 IMPLANT
BLADE SURG 15 STRL SS (BLADE) ×2
CABLE HIGH FREQUENCY MONO STRZ (ELECTRODE) IMPLANT
CLIP APPLIE ROT 10 11.4 M/L (STAPLE) IMPLANT
CLIP APPLIE ROT 13.4 12 LRG (CLIP) IMPLANT
CLIP SUT LAPRA TY ABSORB (SUTURE) ×6 IMPLANT
CLOSURE WOUND 1/2 X4 (GAUZE/BANDAGES/DRESSINGS)
COVER SURGICAL LIGHT HANDLE (MISCELLANEOUS) ×3 IMPLANT
DEVICE SUT QUICK LOAD TK 5 (STAPLE) ×2 IMPLANT
DEVICE SUT TI-KNOT TK 5X26 (MISCELLANEOUS) IMPLANT
DEVICE SUTURE ENDOST 10MM (ENDOMECHANICALS) ×3 IMPLANT
DEVICE TI KNOT TK5 (MISCELLANEOUS)
DISSECTOR BLUNT TIP ENDO 5MM (MISCELLANEOUS) IMPLANT
DRAIN PENROSE 18X1/4 LTX STRL (WOUND CARE) ×3 IMPLANT
DRAPE CAMERA CLOSED 9X96 (DRAPES) ×3 IMPLANT
GAUZE SPONGE 4X4 12PLY STRL (GAUZE/BANDAGES/DRESSINGS) ×3 IMPLANT
GAUZE SPONGE 4X4 16PLY XRAY LF (GAUZE/BANDAGES/DRESSINGS) ×3 IMPLANT
GLOVE BIOGEL M 8.0 STRL (GLOVE) ×3 IMPLANT
GOWN STRL REUS W/TWL XL LVL3 (GOWN DISPOSABLE) ×12 IMPLANT
HANDLE STAPLE EGIA 4 XL (STAPLE) ×3 IMPLANT
HOVERMATT SINGLE USE (MISCELLANEOUS) ×3 IMPLANT
KIT BASIN OR (CUSTOM PROCEDURE TRAY) ×3 IMPLANT
KIT GASTRIC LAVAGE 34FR ADT (SET/KITS/TRAYS/PACK) ×3 IMPLANT
NEEDLE SPNL 22GX3.5 QUINCKE BK (NEEDLE) ×3 IMPLANT
PACK CARDIOVASCULAR III (CUSTOM PROCEDURE TRAY) ×3 IMPLANT
PEN SKIN MARKING BROAD (MISCELLANEOUS) ×3 IMPLANT
QUICK LOAD TK 5 (STAPLE) ×1
RELOAD EGIA 45 MED/THCK PURPLE (STAPLE) ×3 IMPLANT
RELOAD EGIA 45 TAN VASC (STAPLE) IMPLANT
RELOAD EGIA 60 MED/THCK PURPLE (STAPLE) IMPLANT
RELOAD EGIA 60 TAN VASC (STAPLE) IMPLANT
RELOAD ENDO STITCH 2.0 (ENDOMECHANICALS) ×22
RELOAD TRI 45 ART MED THCK PUR (STAPLE) IMPLANT
RELOAD TRI 60 ART MED THCK PUR (STAPLE) ×12 IMPLANT
SCISSORS LAP 5X45 EPIX DISP (ENDOMECHANICALS) ×3 IMPLANT
SCRUB PCMX 4 OZ (MISCELLANEOUS) ×3 IMPLANT
SEALANT SURGICAL APPL DUAL CAN (MISCELLANEOUS) ×3 IMPLANT
SET IRRIG TUBING LAPAROSCOPIC (IRRIGATION / IRRIGATOR) ×3 IMPLANT
SHEARS CURVED HARMONIC AC 45CM (MISCELLANEOUS) ×3 IMPLANT
SLEEVE ADV FIXATION 12X100MM (TROCAR) ×6 IMPLANT
SLEEVE ADV FIXATION 5X100MM (TROCAR) ×3 IMPLANT
SOLUTION ANTI FOG 6CC (MISCELLANEOUS) ×3 IMPLANT
STAPLER VISISTAT 35W (STAPLE) ×3 IMPLANT
STRIP CLOSURE SKIN 1/2X4 (GAUZE/BANDAGES/DRESSINGS) IMPLANT
SUT RELOAD ENDO STITCH 2 48X1 (ENDOMECHANICALS) ×6
SUT RELOAD ENDO STITCH 2.0 (ENDOMECHANICALS) ×5
SUT SURGIDAC NAB ES-9 0 48 120 (SUTURE) IMPLANT
SUT VIC AB 2-0 SH 27 (SUTURE) ×2
SUT VIC AB 2-0 SH 27X BRD (SUTURE) ×1 IMPLANT
SUT VIC AB 4-0 SH 18 (SUTURE) ×3 IMPLANT
SUTURE RELOAD END STTCH 2 48X1 (ENDOMECHANICALS) ×6 IMPLANT
SUTURE RELOAD ENDO STITCH 2.0 (ENDOMECHANICALS) ×5 IMPLANT
SYR 10ML ECCENTRIC (SYRINGE) ×3 IMPLANT
SYR 20CC LL (SYRINGE) ×6 IMPLANT
SYR 50ML LL SCALE MARK (SYRINGE) ×3 IMPLANT
TOWEL OR 17X26 10 PK STRL BLUE (TOWEL DISPOSABLE) ×6 IMPLANT
TOWEL OR NON WOVEN STRL DISP B (DISPOSABLE) ×3 IMPLANT
TRAY FOLEY W/METER SILVER 14FR (SET/KITS/TRAYS/PACK) ×3 IMPLANT
TROCAR ADV FIXATION 12X100MM (TROCAR) ×3 IMPLANT
TROCAR ADV FIXATION 5X100MM (TROCAR) ×3 IMPLANT
TROCAR BLADELESS OPT 5 100 (ENDOMECHANICALS) ×3 IMPLANT
TROCAR XCEL 12X100 BLDLESS (ENDOMECHANICALS) ×3 IMPLANT
TUBING CONNECTING 10 (TUBING) ×4 IMPLANT
TUBING CONNECTING 10' (TUBING) ×2
TUBING ENDO SMARTCAP PENTAX (MISCELLANEOUS) ×3 IMPLANT
TUBING FILTER THERMOFLATOR (ELECTROSURGICAL) ×3 IMPLANT

## 2014-09-24 NOTE — Anesthesia Postprocedure Evaluation (Signed)
  Anesthesia Post-op Note  Patient: Holly Farley  Procedure(s) Performed: Procedure(s) (LRB): LAPAROSCOPIC ROUX-EN-Y GASTRIC BYPASS WITH UPPER ENDOSCOPY (N/A)  Patient Location: PACU  Anesthesia Type: General  Level of Consciousness: awake and alert   Airway and Oxygen Therapy: Patient Spontanous Breathing  Post-op Pain: mild  Post-op Assessment: Post-op Vital signs reviewed, Patient's Cardiovascular Status Stable, Respiratory Function Stable, Patent Airway and No signs of Nausea or vomiting  Last Vitals:  Filed Vitals:   09/24/14 1452  BP: 117/66  Pulse: 102  Temp: 37.5 C  Resp: 16    Post-op Vital Signs: stable   Complications: No apparent anesthesia complications

## 2014-09-24 NOTE — Anesthesia Preprocedure Evaluation (Addendum)
Anesthesia Evaluation  Patient identified by MRN, date of birth, ID band Patient awake    Reviewed: Allergy & Precautions, NPO status , Patient's Chart, lab work & pertinent test results  Airway Mallampati: II  TM Distance: >3 FB Neck ROM: Full    Dental no notable dental hx.    Pulmonary former smoker,  breath sounds clear to auscultation  Pulmonary exam normal       Cardiovascular negative cardio ROS Normal cardiovascular examRhythm:Regular Rate:Normal     Neuro/Psych  Headaches, negative psych ROS   GI/Hepatic negative GI ROS, Neg liver ROS,   Endo/Other  diabetes, Type 2, Oral Hypoglycemic Agents  Renal/GU negative Renal ROS  negative genitourinary   Musculoskeletal negative musculoskeletal ROS (+)   Abdominal (+) + obese,   Peds negative pediatric ROS (+)  Hematology negative hematology ROS (+)   Anesthesia Other Findings   Reproductive/Obstetrics negative OB ROS                            Anesthesia Physical Anesthesia Plan  ASA: II  Anesthesia Plan: General   Post-op Pain Management:    Induction: Intravenous  Airway Management Planned: Oral ETT  Additional Equipment:   Intra-op Plan:   Post-operative Plan: Extubation in OR  Informed Consent: I have reviewed the patients History and Physical, chart, labs and discussed the procedure including the risks, benefits and alternatives for the proposed anesthesia with the patient or authorized representative who has indicated his/her understanding and acceptance.   Dental advisory given  Plan Discussed with: CRNA  Anesthesia Plan Comments:         Anesthesia Quick Evaluation

## 2014-09-24 NOTE — H&P (View-Only) (Signed)
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  She has decided to pursue roux en y gastric bypass.  Her questions have been answered.  Seen in the office and lab reviewed and HgA1C is at 10.  She is ready to undergo roux y gastric bypass.    Past Medical History  Diagnosis Date  . PCOS (polycystic ovarian syndrome)     with infertility, followed by [facility]  . Insulin resistance   . Migraines   . Diabetes mellitus without complication     Past Surgical History  Procedure Laterality Date  . Esophagogastroduodenoscopy    . Cesarean section    . Tubal ligation      Current Outpatient Prescriptions  Medication Sig Dispense Refill  . amoxicillin-clavulanate (AUGMENTIN) 875-125 MG per tablet Take 1 tablet by mouth 2 (two) times daily. 20 tablet 0  . Blood Glucose Monitoring Suppl (ONE TOUCH ULTRA MINI) W/DEVICE KIT Use to test blood sugar daily as instructed. 1 each 0  . fluticasone (FLONASE) 50 MCG/ACT nasal spray Place 2 sprays into both nostrils daily. 16 g 1  . glucose blood test strip Use to test blood sugar 2 times daily. 100 each 5  . HYDROcodone-homatropine (HYCODAN) 5-1.5 MG/5ML syrup Take 5 mLs by mouth every 8 (eight) hours as needed for cough. 120 mL 0  . metFORMIN (GLUCOPHAGE-XR) 500 MG 24 hr tablet Take 1000 mg by mouth 2x daily with meals. 120 tablet 11   No current facility-administered medications for this visit.   Review of patient's allergies indicates no known allergies. Family History  Problem Relation Age of Onset  . Bipolar disorder Mother   . Migraines Mother   . Bipolar disorder Brother   . Diabetes Other    Social History:   reports that she has never smoked. She has never used smokeless tobacco. She reports that she does not drink alcohol or use illicit drugs.   REVIEW OF SYSTEMS : Negative  except for see problem list  Physical Exam:   There were no vitals taken for this visit. There is no weight on file to calculate BMI.  Gen:  WDWN WF NAD  Neurological: Alert and oriented to person, place, and time. Motor and sensory function is grossly intact  Head: Normocephalic and atraumatic.  Eyes: Conjunctivae are normal. Pupils are equal, round, and reactive to light. No scleral icterus.  Neck: Normal range of motion. Neck supple. No tracheal deviation or thyromegaly present.  Cardiovascular:  SR without murmurs or gallops.  No carotid bruits Breast:  Not examined Respiratory: Effort normal.  No respiratory distress. No chest wall tenderness. Breath sounds normal.  No wheezes, rales or rhonchi.  Abdomen:  nontender GU:  Not examined Musculoskeletal: Normal range of motion. Extremities are nontender. No cyanosis, edema or clubbing noted Lymphadenopathy: No cervical, preauricular, postauricular or axillary adenopathy is present Skin: Skin is warm and dry. No rash noted. No diaphoresis. No erythema. No pallor. Pscyh: Normal mood and affect. Behavior is normal. Judgment and thought content normal.   LABORATORY RESULTS: No results found for this or any previous visit (from the past 48 hour(s)).   RADIOLOGY RESULTS: No results found.  Problem List: Patient Active Problem List   Diagnosis Date Noted  . Obesity, Class II, BMI 35-39.9, with comorbidity 09/12/2013  . Acute bronchitis 07/12/2013  . Strep  pharyngitis 06/22/2013  . Contact dermatitis 06/22/2013  . Polycystic ovaries 12/17/2008  . INSULIN RESISTANCE SYNDROME 12/17/2008    Assessment & Plan: DM and morbid obesity;  For roux y gastric bypass to help control difficult type II DM    Matt B. Martin, MD, FACS  [facility] [phone] beeper [phone]  09/12/2014 5:06 PM      

## 2014-09-24 NOTE — Op Note (Signed)
Surgeon: Pollyann Savoy. Daphine Deutscher, MD, FACS Asst:  Gaynelle Adu, MD, FACS and Ovidio Kin, MD, FACS  Anesthesia: General endotracheal Drains: None  Procedure: Laparoscopic Roux en Y gastric bypass with 40 cm BP limb and 100 cm Roux limb, antecolic, antegastric, candy cane to the left.  Closure of Peterson's defect. Upper endoscopy.   Description of Procedure:  The patient was taken to OR 4 at [facility] and given general anesthesia.  The abdomen was prepped with PCMX and draped sterilely.  A time out was performed.    The operation began by identifying the ligament of Treitz. I measured 40 cm downstream and divided the bowel with a 4.5 cm Covidian stapler.  I sutured a Penrose drain along the Roux limb end.  I measured a 1 meter (100 cm) Roux limb and then placed the distal bowels to the BP limb side by side and performed a stapled jejunojejunostomy. The common defect was closed from either end with 4-0 Vicryl using the Endo Stitch. The mesenteric defect was closed with a running 2-0 silk using the Endo Stitch. Tisseel was applied to the suture line.  The omentum was divided with the harmonic scalpel.  The Nathanson retractor was inserted in the left lateral segment of liver was retracted. The foregut dissection ensued.  A 5 cm pouch was measured and the retrogastric space entered.  The pouch was created with two applications of purple Covidien and then purple with TRS.    The Roux limb was then brought up with the candycane pointed left and a back row of sutures of 2-0 Vicryl were placed. I opened along the right side of each structure and inserted the 4.5 cm stapler to create the gastrojejunostomy. The common defect was closed from either end with 2-0 Vicryl and a second row was placed anterior to that the Ewald tube acting as a stent across the anastomosis. The Penrose drain was removed. Peterson's defect was closed with 2-0 silk.   Endoscopy was performed by Dr. Ezzard Standing and no bleeding or bubbles were noted.     The incisions were injected with Exparel and were closed with 4-0 Vicryl and Liquiban.    The patient was taken to the recovery room in satisfactory condition.  Matt B. Daphine Deutscher, MD, FACS

## 2014-09-24 NOTE — Progress Notes (Signed)
Give 12 units of Novolog -per Dr Knox Royalty

## 2014-09-24 NOTE — Progress Notes (Signed)
Patient with heart rate from 107-124.  Dr Gerrit Friends paged with information.  Orders received

## 2014-09-24 NOTE — Op Note (Signed)
Name:  Holly Farley MRN: [id number] Date of Surgery: 09/24/2014  Preop Diagnosis:  Morbid Obesity, poorly controlled DM, S/P RYGB  Postop Diagnosis:  Morbid Obesity, poorly controlled DM,  S/P RYGB  Procedure:  Upper endoscopy  (Intraoperative)  Surgeon:  Ovidio Kin, M.D.  Anesthesia:  GET  Indications for procedure: [name] is a 34 y.o. female whose primary care physician is Ruthe Mannan, MD and has completed a Roux-en-Y gastric bypass today by Dr. Daphine Deutscher.  I am doing an intraoperative upper endoscopy to evaluate the gastric pouch and the gastro-jejunal anastomosis.  Operative Note: The patient is under general anesthesia.  Dr. Daphine Deutscher is laparoscoping the patient while I do an upper endoscopy to evaluate the stomach pouch and gastrojejunal anastomosis.  With the patient intubated, I passed the Pentax endoscope without difficulty down the esophagus.  The esophago-gastric junction was at 39 cm.  The gastro-jejunal anastomosis was at 45 cm.  The mucosa of the stomach looked viable and the staple line was intact without bleeding.  The gastro-jejunal anastomosis looked okay.  While I insufflated the stomach pouch with air, Dr. Daphine Deutscher clamped off the efferent limb of the jejunum.  He then flooded the upper abdomen with saline to put the gastric pouch and gastro-jejunal anastomosis under saline.  There was no bubbling or evidence of a leak.    The scope was then withdrawn.  The esophagus was unremarkable and the patient tolerated the endoscopy without difficulty.  Ovidio Kin, MD, [facility] Surgery Pager: [phone] Office phone:  [phone]

## 2014-09-24 NOTE — Interval H&P Note (Signed)
History and Physical Interval Note:  09/24/2014 9:21 AM  Holly Farley  has presented today for surgery, with the diagnosis of MORBID OBESITY  The various methods of treatment have been discussed with the patient and family. After consideration of risks, benefits and other options for treatment, the patient has consented to  Procedure(s): LAPAROSCOPIC ROUX-EN-Y GASTRIC BYPASS WITH UPPER ENDOSCOPY (N/A) as a surgical intervention .  The patient's history has been reviewed, patient examined, no change in status, stable for surgery.  I have reviewed the patient's chart and labs.  Questions were answered to the patient's satisfaction.     Terresa Marlett B

## 2014-09-24 NOTE — Anesthesia Procedure Notes (Signed)
Procedure Name: Intubation Date/Time: 09/24/2014 10:10 AM Performed by: Enriqueta Shutter D Pre-anesthesia Checklist: Patient identified, Emergency Drugs available, Suction available and Patient being monitored Patient Re-evaluated:Patient Re-evaluated prior to inductionOxygen Delivery Method: Circle system utilized Preoxygenation: Pre-oxygenation with 100% oxygen Intubation Type: IV induction Ventilation: Mask ventilation without difficulty and Oral airway inserted - appropriate to patient size Laryngoscope Size: Mac and 3 Grade View: Grade II Tube size: 7.5 mm Number of attempts: 1 Airway Equipment and Method: Stylet Placement Confirmation: positive ETCO2,  ETT inserted through vocal cords under direct vision,  CO2 detector and breath sounds checked- equal and bilateral Secured at: 21 cm Tube secured with: Tape Dental Injury: Teeth and Oropharynx as per pre-operative assessment

## 2014-09-24 NOTE — Transfer of Care (Signed)
Immediate Anesthesia Transfer of Care Note  Patient: Holly Farley  Procedure(s) Performed: Procedure(s): LAPAROSCOPIC ROUX-EN-Y GASTRIC BYPASS WITH UPPER ENDOSCOPY (N/A)  Patient Location: PACU  Anesthesia Type:General  Level of Consciousness:  sedated, patient cooperative and responds to stimulation  Airway & Oxygen Therapy:Patient Spontanous Breathing and Patient connected to face mask oxgen  Post-op Assessment:  Report given to PACU RN and Post -op Vital signs reviewed and stable  Post vital signs:  Reviewed and stable  Last Vitals:  Filed Vitals:   09/24/14 0802  BP: 123/72  Pulse: 87  Temp: 36.7 C  Resp: 16    Complications: No apparent anesthesia complications

## 2014-09-25 ENCOUNTER — Encounter (HOSPITAL_COMMUNITY): Payer: Self-pay | Admitting: Surgery

## 2014-09-25 ENCOUNTER — Inpatient Hospital Stay (HOSPITAL_COMMUNITY): Payer: BC Managed Care – PPO

## 2014-09-25 LAB — CBC WITH DIFFERENTIAL/PLATELET
BASOS ABS: 0 10*3/uL (ref 0.0–0.1)
Basophils Relative: 0 % (ref 0–1)
EOS ABS: 0 10*3/uL (ref 0.0–0.7)
Eosinophils Relative: 0 % (ref 0–5)
HEMATOCRIT: 39.2 % (ref 36.0–46.0)
Hemoglobin: 13.2 g/dL (ref 12.0–15.0)
LYMPHS PCT: 16 % (ref 12–46)
Lymphs Abs: 1.7 10*3/uL (ref 0.7–4.0)
MCH: 29.6 pg (ref 26.0–34.0)
MCHC: 33.7 g/dL (ref 30.0–36.0)
MCV: 87.9 fL (ref 78.0–100.0)
MONO ABS: 0.8 10*3/uL (ref 0.1–1.0)
Monocytes Relative: 7 % (ref 3–12)
NEUTROS ABS: 8 10*3/uL — AB (ref 1.7–7.7)
Neutrophils Relative %: 77 % (ref 43–77)
Platelets: 248 10*3/uL (ref 150–400)
RBC: 4.46 MIL/uL (ref 3.87–5.11)
RDW: 12.5 % (ref 11.5–15.5)
WBC: 10.5 10*3/uL (ref 4.0–10.5)

## 2014-09-25 LAB — HEMOGLOBIN AND HEMATOCRIT, BLOOD
HCT: 38 % (ref 36.0–46.0)
HEMOGLOBIN: 12.6 g/dL (ref 12.0–15.0)

## 2014-09-25 LAB — GLUCOSE, CAPILLARY
GLUCOSE-CAPILLARY: 231 mg/dL — AB (ref 65–99)
GLUCOSE-CAPILLARY: 244 mg/dL — AB (ref 65–99)
GLUCOSE-CAPILLARY: 233 mg/dL — AB (ref 65–99)
GLUCOSE-CAPILLARY: 222 mg/dL — AB (ref 65–99)
Glucose-Capillary: 276 mg/dL — ABNORMAL HIGH (ref 65–99)

## 2014-09-25 LAB — HEMOGLOBIN A1C
HEMOGLOBIN A1C: 10.8 % — AB (ref 4.8–5.6)
MEAN PLASMA GLUCOSE: 263 mg/dL

## 2014-09-25 MED ORDER — PNEUMOCOCCAL VAC POLYVALENT 25 MCG/0.5ML IJ INJ
0.5000 mL | INJECTION | INTRAMUSCULAR | Status: AC
  Administered 2014-09-26: 0.5 mL via INTRAMUSCULAR
  Filled 2014-09-25 (×2): qty 0.5

## 2014-09-25 MED ORDER — IOHEXOL 300 MG/ML  SOLN
50.0000 mL | Freq: Once | INTRAMUSCULAR | Status: AC | PRN
  Administered 2014-09-25: 50 mL via ORAL

## 2014-09-25 NOTE — Plan of Care (Signed)
Problem: Food- and Nutrition-Related Knowledge Deficit (NB-1.1) Goal: Nutrition education Formal process to instruct or train a patient/client in a skill or to impart knowledge to help patients/clients voluntarily manage or modify food choices and eating behavior to maintain or improve health. Outcome: Completed/Met Date Met:  09/25/14 Nutrition Education Note  Received consult for diet education per DROP protocol.   Discussed 2 week post op diet with pt. Emphasized that liquids must be non carbonated, non caffeinated, and sugar free. Fluid goals discussed. Pt to follow up with outpatient bariatric RD for further diet progression after 2 weeks. Multivitamins and minerals also reviewed. Teach back method used, pt expressed understanding, expect good compliance.   Diet: First 2 Weeks  You will see the nutritionist about two (2) weeks after your surgery. The nutritionist will increase the types of foods you can eat if you are handling liquids well:  If you have severe vomiting or nausea and cannot handle clear liquids lasting longer than 1 day, call your surgeon  Protein Shake  Drink at least 2 ounces of shake 5-6 times per day  Each serving of protein shakes (usually 8 - 12 ounces) should have a minimum of:  15 grams of protein  And no more than 5 grams of carbohydrate  Goal for protein each day:  Men = 80 grams per day  Women = 60 grams per day  Protein powder may be added to fluids such as non-fat milk or Lactaid milk or Soy milk (limit to 35 grams added protein powder per serving)   Hydration  Slowly increase the amount of water and other clear liquids as tolerated (See Acceptable Fluids)  Slowly increase the amount of protein shake as tolerated  Sip fluids slowly and throughout the day  May use sugar substitutes in small amounts (no more than 6 - 8 packets per day; i.e. Splenda)   Fluid Goal  The first goal is to drink at least 8 ounces of protein shake/drink per day (or as directed  by the nutritionist); some examples of protein shakes are [employer], [employer], EAS Edge HP, and Unjury. See handout from pre-op Bariatric Education Class:  Slowly increase the amount of protein shake you drink as tolerated  You may find it easier to slowly sip shakes throughout the day  It is important to get your proteins in first  Your fluid goal is to drink 64 - 100 ounces of fluid daily  It may take a few weeks to build up to this  32 oz (or more) should be clear liquids  And  32 oz (or more) should be full liquids (see below for examples)  Liquids should not contain sugar, caffeine, or carbonation   Clear Liquids:  Water or Sugar-free flavored water (i.e. Fruit H2O, Propel)  Decaffeinated coffee or tea (sugar-free)  Crystal Lite, Wyler's Lite, Minute Maid Lite  Sugar-free Jell-O  Bouillon or broth  Sugar-free Popsicle: *Less than 20 calories each; Limit 1 per day   Full Liquids:  Protein Shakes/Drinks + 2 choices per day of other full liquids  Full liquids must be:  No More Than 12 grams of Carbs per serving  No More Than 3 grams of Fat per serving  Strained low-fat cream soup  Non-Fat milk  Fat-free Lactaid Milk  Sugar-free yogurt (Dannon Lite & Fit, Greek yogurt)     Clayton Bibles, MS, RD, LDN Pager: [phone] After Hours Pager: [phone]

## 2014-09-25 NOTE — Progress Notes (Signed)
Patient alert and oriented, Post op day 1.  Provided support and encouragement.  Encouraged pulmonary toilet, ambulation and small sips of liquids when swallow study returned satisfactorily.  All questions answered.  Will continue to monitor. 

## 2014-09-26 DIAGNOSIS — Z9884 Bariatric surgery status: Secondary | ICD-10-CM

## 2014-09-26 LAB — CBC WITH DIFFERENTIAL/PLATELET
BASOS PCT: 0 % (ref 0–1)
Basophils Absolute: 0 10*3/uL (ref 0.0–0.1)
Eosinophils Absolute: 0.1 10*3/uL (ref 0.0–0.7)
Eosinophils Relative: 2 % (ref 0–5)
HCT: 38 % (ref 36.0–46.0)
Hemoglobin: 12.4 g/dL (ref 12.0–15.0)
LYMPHS PCT: 26 % (ref 12–46)
Lymphs Abs: 2.2 10*3/uL (ref 0.7–4.0)
MCH: 28.9 pg (ref 26.0–34.0)
MCHC: 32.6 g/dL (ref 30.0–36.0)
MCV: 88.6 fL (ref 78.0–100.0)
Monocytes Absolute: 0.6 10*3/uL (ref 0.1–1.0)
Monocytes Relative: 8 % (ref 3–12)
Neutro Abs: 5.3 10*3/uL (ref 1.7–7.7)
Neutrophils Relative %: 64 % (ref 43–77)
PLATELETS: 214 10*3/uL (ref 150–400)
RBC: 4.29 MIL/uL (ref 3.87–5.11)
RDW: 12.8 % (ref 11.5–15.5)
WBC: 8.2 10*3/uL (ref 4.0–10.5)

## 2014-09-26 LAB — GLUCOSE, CAPILLARY
GLUCOSE-CAPILLARY: 203 mg/dL — AB (ref 65–99)
GLUCOSE-CAPILLARY: 216 mg/dL — AB (ref 65–99)
GLUCOSE-CAPILLARY: 247 mg/dL — AB (ref 65–99)
Glucose-Capillary: 254 mg/dL — ABNORMAL HIGH (ref 65–99)

## 2014-09-26 NOTE — Progress Notes (Signed)
Patient alert and oriented, pain is controlled. Patient is tolerating fluids,  advanced to protein shake today, patient tolerated well.  Reviewed Gastric sleeve discharge instructions with patient and patient is able to articulate understanding.  Provided information on BELT program, Support Group and [facility] outpatient pharmacy. All questions answered, will continue to monitor.  

## 2014-09-26 NOTE — Progress Notes (Signed)
Discussed discharge instructions with both patient and husband. Had no questions nor concerns. Discharged to home.

## 2014-09-26 NOTE — Discharge Instructions (Signed)

## 2014-09-26 NOTE — Discharge Summary (Signed)
Physician Discharge Summary  Patient ID: Holly Farley MRN: [id number] DOB/AGE: [birth year] 34 y.o.  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  UGI on PD 1 looked good.  Took liquids and ready for discharge  Consults: none  Significant Diagnostic Studies: UGI    Discharge Exam: Blood pressure 117/80, pulse 96, temperature 98.3 F (36.8 C), temperature source Oral, resp. rate 16, height 5' 2"  (1.575 m), weight 81.829 kg (180 lb 6.4 oz), last menstrual period 09/06/2014, SpO2 97 %. Incisions healing OK with minimal pain.   Disposition:   Discharge Instructions    Ambulate hourly while awake    Complete by:  As directed      Call MD for:  difficulty breathing, headache or visual disturbances    Complete by:  As directed      Call MD for:  persistant dizziness or light-headedness    Complete by:  As directed      Call MD for:  persistant nausea and vomiting    Complete by:  As directed      Call MD for:  redness, tenderness, or signs of infection (pain, swelling, redness, odor or green/yellow discharge around incision site)    Complete by:  As directed      Call MD for:  severe uncontrolled pain    Complete by:  As directed      Call MD for:  temperature >101 F    Complete by:  As directed      Diet bariatric full liquid    Complete by:  As directed      Incentive spirometry    Complete by:  As directed   Perform hourly while awake            Medication List    STOP taking these medications        amoxicillin-clavulanate 875-125 MG per tablet  Commonly known as:  AUGMENTIN      TAKE these medications        CALCIUM + D PO  Take 1 tablet by mouth daily.     fluticasone 50  MCG/ACT nasal spray  Commonly known as:  FLONASE  Place 2 sprays into both nostrils daily.     glucose blood test strip  Use to test blood sugar 2 times daily.     HYDROcodone-homatropine 5-1.5 MG/5ML syrup  Commonly known as:  HYCODAN  Take 5 mLs by mouth every 8 (eight) hours as needed for cough.     metFORMIN 500 MG 24 hr tablet  Commonly known as:  GLUCOPHAGE-XR  Take 1000 mg by mouth 2x daily with meals.  Notes to Patient:  Monitor Blood Sugar Frequently and keep a log for primary care physician, you may need to adjust medication dosage with rapid weight loss.       multivitamin with minerals Tabs tablet  Take 1 tablet by mouth daily.     ONE TOUCH ULTRA MINI W/DEVICE Kit  Use to test blood sugar daily as instructed.     [name] 2500 MCG Subl  Place 2,500 mcg under the tongue daily.           Follow-up Information    Follow up with [facility]  B, MD. Daphane Shepherd on 10/11/2014.   Specialty:  General Surgery   Why:  For Post-Op Check at 9:45 AM   Contact information:   [address] [place] [address] [phone]       Signed: Pedro Earls 09/26/2014, 12:56 PM

## 2014-10-03 ENCOUNTER — Emergency Department (HOSPITAL_COMMUNITY)
Admission: EM | Admit: 2014-10-03 | Discharge: 2014-10-03 | Disposition: A | Discharge status: Home / Self Care | Payer: BC Managed Care – PPO | Attending: Emergency Medicine | Admitting: Emergency Medicine

## 2014-10-03 ENCOUNTER — Encounter (HOSPITAL_COMMUNITY): Payer: Self-pay | Admitting: Emergency Medicine

## 2014-10-03 ENCOUNTER — Telehealth (HOSPITAL_COMMUNITY): Payer: Self-pay

## 2014-10-03 DIAGNOSIS — Z87891 Personal history of nicotine dependence: Secondary | ICD-10-CM | POA: Insufficient documentation

## 2014-10-03 DIAGNOSIS — Z8679 Personal history of other diseases of the circulatory system: Secondary | ICD-10-CM | POA: Insufficient documentation

## 2014-10-03 DIAGNOSIS — Z79899 Other long term (current) drug therapy: Secondary | ICD-10-CM | POA: Diagnosis not present

## 2014-10-03 DIAGNOSIS — E1165 Type 2 diabetes mellitus with hyperglycemia: Secondary | ICD-10-CM | POA: Insufficient documentation

## 2014-10-03 DIAGNOSIS — R112 Nausea with vomiting, unspecified: Secondary | ICD-10-CM | POA: Insufficient documentation

## 2014-10-03 DIAGNOSIS — Z9851 Tubal ligation status: Secondary | ICD-10-CM | POA: Diagnosis not present

## 2014-10-03 LAB — URINALYSIS, ROUTINE W REFLEX MICROSCOPIC
Bilirubin Urine: NEGATIVE
Glucose, UA: NEGATIVE mg/dL
Hgb urine dipstick: NEGATIVE
Nitrite: NEGATIVE
PH: 5.5 (ref 5.0–8.0)
Protein, ur: NEGATIVE mg/dL
SPECIFIC GRAVITY, URINE: 1.016 (ref 1.005–1.030)
Urobilinogen, UA: 0.2 mg/dL (ref 0.0–1.0)

## 2014-10-03 LAB — BASIC METABOLIC PANEL
ANION GAP: 13 (ref 5–15)
BUN: 9 mg/dL (ref 6–20)
CALCIUM: 9.4 mg/dL (ref 8.9–10.3)
CHLORIDE: 102 mmol/L (ref 101–111)
CO2: 20 mmol/L — AB (ref 22–32)
CREATININE: 0.65 mg/dL (ref 0.44–1.00)
GFR calc Af Amer: >60 mL/min (ref >60)
GFR calc non Af Amer: >60 mL/min (ref >60)
GLUCOSE: 170 mg/dL — AB (ref 65–99)
POTASSIUM: 3.8 mmol/L (ref 3.5–5.1)
Sodium: 135 mmol/L (ref 135–145)

## 2014-10-03 LAB — I-STAT CHEM 8, ED
BUN: 8 mg/dL (ref 6–20)
Calcium, Ion: 1.2 mmol/L (ref 1.12–1.23)
Chloride: 102 mmol/L (ref 101–111)
Creatinine, Ser: 0.6 mg/dL (ref 0.44–1.00)
Glucose, Bld: 171 mg/dL — ABNORMAL HIGH (ref 65–99)
HEMATOCRIT: 40 % (ref 36.0–46.0)
Hemoglobin: 13.6 g/dL (ref 12.0–15.0)
Potassium: 3.8 mmol/L (ref 3.5–5.1)
SODIUM: 134 mmol/L — AB (ref 135–145)
TCO2: 20 mmol/L (ref 0–100)

## 2014-10-03 LAB — CBC
HEMATOCRIT: 37 % (ref 36.0–46.0)
Hemoglobin: 13 g/dL (ref 12.0–15.0)
MCH: 30.6 pg (ref 26.0–34.0)
MCHC: 35.1 g/dL (ref 30.0–36.0)
MCV: 87.1 fL (ref 78.0–100.0)
Platelets: 343 10*3/uL (ref 150–400)
RBC: 4.25 MIL/uL (ref 3.87–5.11)
RDW: 12.6 % (ref 11.5–15.5)
WBC: 11.5 10*3/uL — ABNORMAL HIGH (ref 4.0–10.5)

## 2014-10-03 LAB — URINE MICROSCOPIC-ADD ON

## 2014-10-03 LAB — CBG MONITORING, ED: Glucose-Capillary: 169 mg/dL — ABNORMAL HIGH (ref 65–99)

## 2014-10-03 MED ORDER — ONDANSETRON 4 MG PO TBDP: 4.0000 mg | ORAL_TABLET | Freq: Three times a day (TID) | ORAL | Status: DC | PRN

## 2014-10-03 NOTE — ED Notes (Signed)
EDP Holly Farley is in room currently, RN Mickel Fuchs will notify if an IV will be startred of if I need to go draw labs

## 2014-10-03 NOTE — Telephone Encounter (Signed)

## 2014-10-03 NOTE — Discharge Instructions (Signed)
Bariatric Surgery, Care After Ms. Arita Miss, your blood sugar today was checked twice.  The first reading was 169. The second reading was 170. Stop eating soft foods and call your surgeon in the morning for close evaluation.  Continue metformin 1000 mg twice per day and follow-up with your primary care physician on Monday regarding your high blood sugar.  Take Zofran as needed for nausea. If any symptoms worsen come back to emergency department immediately. Thank you. Refer to this sheet in the next few weeks. These instructions provide you with information on caring for yourself after your procedure. Your health care provider may also give you more specific instructions. Your treatment has been planned according to current medical practices, but problems sometimes occur. Call your health care provider if you have any problems or questions after your procedure. WHAT TO EXPECT AFTER THE PROCEDURE After your procedure, it is typical to have the following sensations:  Soreness.  Sluggishness.  Tiredness.  Moodiness.  Chilliness. It is not unusual to have dry skin and some hair loss after bariatric surgery. HOME CARE INSTRUCTIONS  Do not drink alcohol, use public transportation, or sign important papers for at least 1 day following surgery.  Do not resume physical activities or drive until directed by your surgeon.  Avoid lifting anything over 10 pounds (4.5 kg) for 6 weeks following surgery, or until approved by your surgeon.   Only take over-the-counter or prescription medicines for pain, discomfort, or fever as directed by your surgeon.   Resume your diet as directed by your surgeon. You will resume eating with liquids and pureed foods, then soft foods, and progress to a more normal diet over time. Follow your surgeon or dietitian's guidelines for what and how much to eat and drink. You will need to eat slowly, so as not to cause discomfort and vomiting.  Use showers for bathing as  directed by your surgeon.   Change dressings as directed by your surgeon.  Schedule a follow-up appointment with your surgeon as directed. SEEK MEDICAL CARE IF:   There is redness, swelling, or increasing pain in the wound.   There is pus coming from the wound.   There is drainage from the wound lasting longer than 1 day.   An unexplained oral temperature above 102F (38.9C) develops.   You notice a foul smell coming from the wound or dressing.   There is a breaking open of the wound (edges not staying together) after stitches have been removed.   You notice increasing pain in the shoulder-strap areas.   You develop episodes of dizziness or faint while standing.   You develop shortness of breath.   You develop persistent nausea or vomiting.   Your soreness seems to be getting worse rather than better.  SEEK IMMEDIATE MEDICAL CARE IF:   You develop a rash.   You have difficulty breathing.   You develop, or feel you are developing, any reaction or side effects to medicines. FOR MORE INFORMATION American Society for Bariatric Surgery: www.asbs.org Weight-control Information Network (WIN): win.StageSync.si Document Released: 02/16/2005 Document Revised: 07/03/2013 Document Reviewed: 08/17/2012 [facility] Patient Information 2015 [place], [place]. This information is not intended to replace advice given to you by your health care provider. Make sure you discuss any questions you have with your health care provider. High Blood Sugar High blood sugar (hyperglycemia) means that the level of sugar in your blood is higher than it should be. Signs of high blood sugar include:  Feeling thirsty.  Frequent peeing (  urinating).  Feeling tired or sleepy.  Dry mouth.  Vision changes.  Feeling weak.  Feeling hungry but losing weight.  Numbness and tingling in your hands or feet.  Headache. When you ignore these signs, your blood sugar may keep going up. These  problems may get worse, and other problems may begin. HOME CARE  Check your blood sugars as told by your doctor. Write down the numbers with the date and time.  Take the right amount of insulin or diabetes pills at the right time. Write down the dose with date and time.  Refill your insulin or diabetes pills before running out.  Watch what you eat. Follow your meal plan.  Drink liquids without sugar, such as water. Check with your doctor if you have kidney or heart disease.  Follow your doctor's orders for exercise. Exercise at the same time of day.  Keep your doctor's appointments. GET HELP RIGHT AWAY IF:   You have trouble thinking or are confused.  You have fast breathing with fruity smelling breath.  You pass out (faint).  You have 2 to 3 days of high blood sugars and you do not know why.  You have chest pain.  You are feeling sick to your stomach (nauseous) or throwing up (vomiting).  You have sudden vision changes. MAKE SURE YOU:   Understand these instructions.  Will watch your condition.  Will get help right away if you are not doing well or get worse. Document Released: 12/14/2008 Document Revised: 05/11/2011 Document Reviewed: 12/14/2008 [facility] Patient Information 2015 [place], [place]. This information is not intended to replace advice given to you by your health care provider. Make sure you discuss any questions you have with your health care provider.

## 2014-10-03 NOTE — ED Notes (Addendum)
Pt states she had a gastric bypass last week, and has had normal CBGs since. States she's been taking metformin for her BG before surgery. States last night she didn't feel well, CBG was 247. Says this afternoon she tried eating, vomited, CBG 336, and took her metformin. States since then she's been feeling worse, and her blood sugars have been steadily rising since this morning.

## 2014-10-03 NOTE — ED Provider Notes (Signed)
CSN: [id number]     Arrival date & time 10/03/14  2143 History   First MD Initiated Contact with Patient 10/03/14 2206     Chief Complaint  Patient presents with  . Hyperglycemia  . Emesis     (Consider location/radiation/quality/duration/timing/severity/associated sxs/prior Treatment) HPI  Holly Farley is a 34 y.o. female with past medical history of gastric Roux-en-Y presenting today with hyperglycemia.  Patient states she was discharged on July 25 on metformin 1000 mg twice a day. She's been compliant with this. Her blood sugars have been normal until 2 days ago. At that time she was advised to increase her diet to soft foods. She was eating things such as cheese and scrambled eggs.  Since that time her blood sugars have been in the 350s despite her compliance with medicines. She had an episode of emesis today as well. Patient presents emergency department out of concern for uncontrolled hyperglycemia. She states she has follow-up appointment with her primary care physician this coming Monday and her surgeon next Thursday.  10 Systems reviewed and are negative for acute change except as noted in the HPI.    Past Medical History  Diagnosis Date  . PCOS (polycystic ovarian syndrome)     with infertility, followed by [facility]  . Insulin resistance   . Migraines   . Diabetes mellitus without complication    Past Surgical History  Procedure Laterality Date  . Esophagogastroduodenoscopy    . Cesarean section    . Tubal ligation    . Gastric roux-en-y N/A 09/24/2014    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;  Surgeon: Johnathan Hausen, MD;  Location: [facility]S;  Service: General;  Laterality: N/A;   Family History  Problem Relation Age of Onset  . Bipolar disorder Mother   . Migraines Mother   . Bipolar disorder Brother   . Diabetes Other    History  Substance Use Topics  . Smoking status: Former Research scientist (life sciences)  . Smokeless tobacco: Never Used  . Alcohol Use: No    OB History    No data available     Review of Systems    Allergies  Review of patient's allergies indicates no known allergies.  Home Medications   Prior to Admission medications   Medication Sig Start Date End Date Taking? Authorizing Provider  Blood Glucose Monitoring Suppl (ONE TOUCH ULTRA MINI) W/DEVICE KIT Use to test blood sugar daily as instructed. 09/06/13  Yes Philemon Kingdom, MD  [name] Take 1 tablet by mouth 3 (three) times daily.    Yes Historical Provider, MD  Cyanocobalamin ([name]) 2500 MCG SUBL Place 2,500 mcg under the tongue daily.   Yes Historical Provider, MD  metFORMIN (GLUCOPHAGE-XR) 500 MG 24 hr tablet Take 1000 mg by mouth 2x daily with meals. 09/06/13  Yes Philemon Kingdom, MD  Multiple Vitamin (MULTIVITAMIN WITH MINERALS) TABS tablet Take 1 tablet by mouth daily.   Yes Historical Provider, MD  oxyCODONE (ROXICODONE) 5 MG/5ML solution Take 5 mg by mouth every 6 (six) hours as needed for severe pain.  09/17/14  Yes Historical Provider, MD  fluticasone (FLONASE) 50 MCG/ACT nasal spray Place 2 sprays into both nostrils daily. Patient not taking: Reported on 09/17/2014 03/29/14 03/29/15  Jearld Fenton, NP  glucose blood test strip Use to test blood sugar 2 times daily. Patient not taking: Reported on 10/03/2014 09/06/13   Philemon Kingdom, MD  HYDROcodone-homatropine [facility]) 5-1.5 MG/5ML syrup Take 5 mLs by  mouth every 8 (eight) hours as needed for cough. Patient not taking: Reported on 09/17/2014 03/15/14   Jearld Fenton, NP   BP 124/73 mmHg  Pulse 80  Temp(Src) 98.1 F (36.7 C) (Oral)  Resp 18  SpO2 96%  LMP 09/05/2014 Physical Exam  Constitutional: She is oriented to person, place, and time. She appears well-developed and well-nourished. No distress.  HENT:  Head: Normocephalic and atraumatic.  Nose: Nose normal.  Mouth/Throat: Oropharynx is clear and moist. No oropharyngeal exudate.  Eyes: Conjunctivae and EOM are  normal. Pupils are equal, round, and reactive to light. No scleral icterus.  Neck: Normal range of motion. Neck supple. No JVD present. No tracheal deviation present. No thyromegaly present.  Cardiovascular: Normal rate, regular rhythm and normal heart sounds.  Exam reveals no gallop and no friction rub.   No murmur heard. Pulmonary/Chest: Effort normal and breath sounds normal. No respiratory distress. She has no wheezes. She exhibits no tenderness.  Abdominal: Soft. Bowel sounds are normal. She exhibits no distension and no mass. There is no tenderness. There is no rebound and no guarding.  Musculoskeletal: Normal range of motion. She exhibits no edema or tenderness.  Lymphadenopathy:    She has no cervical adenopathy.  Neurological: She is alert and oriented to person, place, and time. No cranial nerve deficit. She exhibits normal muscle tone.  Skin: Skin is warm and dry. No rash noted. No erythema. No pallor.  Nursing note and vitals reviewed.   ED Course  Procedures (including critical care time) Labs Review Labs Reviewed  BASIC METABOLIC PANEL - Abnormal; Notable for the following:    CO2 20 (*)    Glucose, Bld 170 (*)    All other components within normal limits  CBC - Abnormal; Notable for the following:    WBC 11.5 (*)    All other components within normal limits  URINALYSIS, ROUTINE W REFLEX MICROSCOPIC (NOT AT [facility]) - Abnormal; Notable for the following:    APPearance CLOUDY (*)    Ketones, ur >80 (*)    Leukocytes, UA SMALL (*)    All other components within normal limits  URINE MICROSCOPIC-ADD ON - Abnormal; Notable for the following:    Squamous Epithelial / LPF FEW (*)    Bacteria, UA FEW (*)    All other components within normal limits  CBG MONITORING, ED - Abnormal; Notable for the following:    Glucose-Capillary 169 (*)    All other components within normal limits  I-STAT CHEM 8, ED - Abnormal; Notable for the following:    Sodium 134 (*)    Glucose, Bld 171  (*)    All other components within normal limits    Imaging Review No results found.   EKG Interpretation None      MDM   Final diagnoses:  None   patient presents emergency department for hyperglycemia. Fingerstick is 169. I hesitate to alter her blood sugar regimen as she had a recent surgery and is having nausea and vomiting. I would hate for her to become hypoglycemic before her appointment with her primary care doctor Monday. Will check BMP and have her follow-up with her doctor. Continue metformin 1000 mg twice a day. She appears well in no acute distress. Her vital signs remain within her normal limits and she is safe for discharge.  Everlene Balls, MD 10/03/14 2330

## 2014-10-04 ENCOUNTER — Telehealth: Payer: Self-pay

## 2014-10-04 NOTE — Telephone Encounter (Signed)
PLEASE NOTE: All timestamps contained within this report are represented as [place] Standard Time. CONFIDENTIALTY NOTICE: This fax transmission is intended only for the addressee. It contains information that is legally privileged, confidential or otherwise protected from use or disclosure. If you are not the intended recipient, you are strictly prohibited from reviewing, disclosing, copying using or disseminating any of this information or taking any action in reliance on or regarding this information. If you have received this fax in error, please notify us immediately by telephone so that we can arrange for its return to [place]. Phone: [phone], Toll-Free: [phone], Fax: [phone] Page: 1 of 2 Call Id: [id number] [facility] Night - Client TELEPHONE ADVICE RECORD [facility] Patient Name: LAURIEANNE GALLOWAY Gender: Female DOB: [birth year] Age: 34 Y 5 M 12 D Return Phone Number: [phone] (Primary) Address: City/State/Zip: [place] [address] Client [facility] Night - Client Client Site [facility] [place] - Night Physician Dayton Martes, Jovita Gamma Contact Type Call Call Type Triage / Clinical Relationship To Patient Self Return Phone Number [phone] (Primary) Chief Complaint Blood Sugar High Initial Comment Caller states her Blood sugar has been high and is currently at 346, she is diabetic and had gastric bypass surgery last week. She has been taking metformin. PreDisposition Go to ED Nurse Assessment Nurse: Orvis Brill, RN, Olegario Messier Date/Time Lamount Cohen Time): 10/03/2014 8:49:21 PM Confirm and document reason for call. If symptomatic, describe symptoms. ---Caller states that she is a Type 2 diabetic & had gastric bypass surgery on 09/24/14, reports that her BS was 346 at 2023 & just rechecked & was 352. Is on Metformin 1000 mg BID. Has the patient traveled out of the country within the last 30 days? ---No Does the  patient require triage? ---Yes Related visit to physician within the last 2 weeks? ---No not for increased BS Does the PT have any chronic conditions? (i.e. diabetes, asthma, etc.) ---Yes List chronic conditions. ---Type 2 DM, PCOS, recent gastric bypass surgery Did the patient indicate they were pregnant? ---No Guidelines Guideline Title Affirmed Question Affirmed Notes Nurse Date/Time (Eastern Time) Diabetes - High Blood Sugar [1] Blood glucose > 240 mg/dl (13 mmol/l) AND [4] vomiting AND [3] unable to check urine ketones Orvis Brill, RN, Olegario Messier 10/03/2014 8:51:28 PM Disp. Time Lamount Cohen Time) Disposition Final User 10/03/2014 8:54:08 PM Go to ED Now (or PCP triage) Yes Orvis Brill, RN, Olegario Messier PLEASE NOTE: All timestamps contained within this report are represented as [place] Standard Time. CONFIDENTIALTY NOTICE: This fax transmission is intended only for the addressee. It contains information that is legally privileged, confidential or otherwise protected from use or disclosure. If you are not the intended recipient, you are strictly prohibited from reviewing, disclosing, copying using or disseminating any of this information or taking any action in reliance on or regarding this information. If you have received this fax in error, please notify us immediately by telephone so that we can arrange for its return to [place]. Phone: [phone], Toll-Free: [phone], Fax: [phone] Page: 2 of 2 Call Id: [id number] Caller Understands: Yes Disagree/Comply: Comply Care Advice Given Per Guideline * IF NO PCP TRIAGE: You need to be seen. Go to the [facility] at [facility] within the next hour. Leave as soon as you can. DRIVING: Another adult should drive. BRING MEDICINES: * Please bring a list of your current medicines when you go to see the doctor. * It is also a good idea to bring the pill bottles too. This will help the  doctor to make certain you are taking the right medicines and the right  dose. CARE ADVICE given per Diabetes - High Blood Sugar (Adult) guideline. After Care Instructions Given Call Event Type User Date / Time Description Comments User: Almira Bar, RN Date/Time (Eastern Time): 10/03/2014 8:55:01 PM Caller states that she has already spoken with her surgeon prior to speaking with triager & was advised to call her PCP about her BS being high. Referrals [facility] - ED

## 2014-10-04 NOTE — Telephone Encounter (Signed)
Please call to check on pt. 

## 2014-10-04 NOTE — Telephone Encounter (Signed)
Pt seen 10/03/14 at [facility] ED per encounter note.

## 2014-10-04 NOTE — Telephone Encounter (Signed)
Lm on pts vm requesting a call back 

## 2014-10-08 ENCOUNTER — Encounter: Payer: Self-pay | Admitting: Family Medicine

## 2014-10-08 ENCOUNTER — Ambulatory Visit (INDEPENDENT_AMBULATORY_CARE_PROVIDER_SITE_OTHER): Payer: BC Managed Care – PPO | Admitting: Family Medicine

## 2014-10-08 ENCOUNTER — Encounter (INDEPENDENT_AMBULATORY_CARE_PROVIDER_SITE_OTHER): Payer: Self-pay

## 2014-10-08 VITALS — BP 126/62 | HR 88 | Temp 97.7°F | Wt 173.2 lb

## 2014-10-08 DIAGNOSIS — Z9884 Bariatric surgery status: Secondary | ICD-10-CM

## 2014-10-08 DIAGNOSIS — L719 Rosacea, unspecified: Secondary | ICD-10-CM | POA: Diagnosis not present

## 2014-10-08 DIAGNOSIS — E119 Type 2 diabetes mellitus without complications: Secondary | ICD-10-CM | POA: Diagnosis not present

## 2014-10-08 LAB — GLUCOSE, POCT (MANUAL RESULT ENTRY): POC Glucose: 161 mg/dl — AB (ref 70–99)

## 2014-10-08 MED ORDER — METRONIDAZOLE 1 % EX GEL: Freq: Every day | CUTANEOUS | Status: DC

## 2014-10-08 MED ORDER — METFORMIN HCL ER 500 MG PO TB24: ORAL_TABLET | ORAL | Status: DC

## 2014-10-08 MED ORDER — GLUCOSE BLOOD VI STRP: ORAL_STRIP | Status: DC

## 2014-10-08 NOTE — Assessment & Plan Note (Signed)
New- eRx sent for daily Metrogel topical use. She will call me in 3 weeks with an update.

## 2014-10-08 NOTE — Assessment & Plan Note (Addendum)
Continue current rx. Fasting FSBS was 161 today.   Has follow up with surgeon Thursday and nutritionist tomorrow. Follow up with Dr. Elvera Lennox.

## 2014-10-08 NOTE — Patient Instructions (Signed)
Great to see you. Follow up with Dr. Elvera Lennox or my myself in 2 weeks.

## 2014-10-08 NOTE — Assessment & Plan Note (Signed)
Recovering better.  Energy coming back slowly.  Already back at work.  Keep follow up with surgery on Thursday and nutritionist tomorrow.

## 2014-10-08 NOTE — Progress Notes (Signed)
Pre visit review using our clinic review tool, if applicable. No additional management support is needed unless otherwise documented below in the visit note. 

## 2014-10-08 NOTE — Addendum Note (Signed)
Addended by: Desmond Dike on: 10/08/2014 09:53 AM   Modules accepted: Orders

## 2014-10-08 NOTE — Progress Notes (Signed)
Subjective:   Patient ID: Holly Farley, female    DOB: 1980/09/22, 34 y.o.   MRN: [id number]  Holly Farley is a pleasant 34 y.o. year old female who presents to clinic today with Follow-up and Diabetes  on 10/08/2014  HPI:  S/p lap roux en y gastric bypass surgery on 09/24/14.  Had been doing ok but post operative hyperglycemia and was seen in the ER for this 8/3- note reviewed.  Was discharged home on 7/25 on Metformin 1000 mg twice daily.  Blood sugars climbed into 350s once she started to introduce solid foods into her diet despite compliance with rx.  She also had vomited once prior to presenting to ER.  Finger stick in ER 160.  She thinks her test strips were wrong.  Now ranging 150s- 190s. No episodes of hypoglycemia.  Also has rash above her eye lids on and off for months, worse in the heat.  Mom has rosacea and presents similarly.  No further episodes of vomiting.    Lab Results  Component Value Date   HGBA1C 10.8* 09/24/2014   Lab Results  Component Value Date   NA 134* 10/03/2014   K 3.8 10/03/2014   CL 102 10/03/2014   CO2 20* 10/03/2014   Lab Results  Component Value Date   CREATININE 0.60 10/03/2014   No adjustment to rxs and was advised to follow up with me here today.  Current Outpatient Prescriptions on File Prior to Visit  Medication Sig Dispense Refill  . Blood Glucose Monitoring Suppl (ONE TOUCH ULTRA MINI) W/DEVICE KIT Use to test blood sugar daily as instructed. 1 each 0  . [name] Take 1 tablet by mouth 3 (three) times daily.     . Cyanocobalamin ([name]) 2500 MCG SUBL Place 2,500 mcg under the tongue daily.    . Multiple Vitamin (MULTIVITAMIN WITH MINERALS) TABS tablet Take 1 tablet by mouth daily.    . ondansetron (ZOFRAN ODT) 4 MG disintegrating tablet Take 1 tablet (4 mg total) by mouth every 8 (eight) hours as needed for nausea or vomiting. 12 tablet 0   No current facility-administered medications  on file prior to visit.    No Known Allergies  Past Medical History  Diagnosis Date  . PCOS (polycystic ovarian syndrome)     with infertility, followed by [facility]  . Insulin resistance   . Migraines   . Diabetes mellitus without complication     Past Surgical History  Procedure Laterality Date  . Esophagogastroduodenoscopy    . Cesarean section    . Tubal ligation    . 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 Bipolar disorder Mother   . Migraines Mother   . Bipolar disorder Brother   . Diabetes Other     History   Social History  . Marital Status: Married    Spouse Name: N/A  . Number of Children: N/A  . Years of Education: N/A   Occupational History  . [employer], going back to school    Social History Main Topics  . Smoking status: Former Research scientist (life sciences)  . Smokeless tobacco: Never Used  . Alcohol Use: No  . Drug Use: No  . Sexual Activity: Not on file   Other Topics Concern  .  Not on file   Social History Narrative   The PMH, PSH, Social History, Family History, Medications, and allergies have been reviewed in [facility], and have been updated if relevant.    Review of Systems  Constitutional: Negative.   Respiratory: Negative.   Cardiovascular: Negative.   Gastrointestinal: Negative.   Endocrine: Negative.   Genitourinary: Negative.   Musculoskeletal: Negative.   Skin: Negative.   Allergic/Immunologic: Negative.   Neurological: Negative.   Hematological: Negative.   Psychiatric/Behavioral: Negative.   All other systems reviewed and are negative.      Objective:    BP 126/62 mmHg  Pulse 88  Temp(Src) 97.7 F (36.5 C) (Oral)  Wt 173 lb 4 oz (78.586 kg)  SpO2 98%  LMP 09/30/2014  Wt Readings from Last 3 Encounters:  10/08/14 173 lb 4 oz (78.586 kg)    09/24/14 180 lb 6.4 oz (81.829 kg)  09/17/14 183 lb (83.008 kg)    Physical Exam  Constitutional: She is oriented to person, place, and time. She appears well-developed and well-nourished. No distress.  HENT:  Head: Normocephalic and atraumatic.  Eyes: Conjunctivae are normal.  Neck: Normal range of motion.  Cardiovascular: Normal rate.   Pulmonary/Chest: Effort normal.  Musculoskeletal: Normal range of motion.  Neurological: She is alert and oriented to person, place, and time. No cranial nerve deficit.  Skin: Skin is warm and dry.  Psychiatric: She has a normal mood and affect. Her behavior is normal. Judgment and thought content normal.  Nursing note and vitals reviewed.         Assessment & Plan:   Gastric bypass status for obesity July 2016  Type 2 diabetes mellitus without complication  Rosacea No Follow-up on file.

## 2014-10-09 ENCOUNTER — Encounter: Payer: BC Managed Care – PPO | Attending: Surgery

## 2014-10-09 VITALS — Ht 62.0 in | Wt 171.0 lb

## 2014-10-09 DIAGNOSIS — Z6835 Body mass index (BMI) 35.0-35.9, adult: Secondary | ICD-10-CM | POA: Insufficient documentation

## 2014-10-09 DIAGNOSIS — Z713 Dietary counseling and surveillance: Secondary | ICD-10-CM | POA: Insufficient documentation

## 2014-10-09 DIAGNOSIS — IMO0001 Reserved for inherently not codable concepts without codable children: Secondary | ICD-10-CM

## 2014-10-09 NOTE — Progress Notes (Signed)
Bariatric Class:  Appt start time: 1530 end time:  1630.  2 Week Post-Operative Nutrition Class  Patient was seen on 10/09/14 for Post-Operative Nutrition [facility].   Surgery date: 09/24/14 Surgery type: RYGB [facility]: 184 lbs on 08/20/14 Weight today: 171.0 lbs  Weight change: 20.5 lbs  TANITA  BODY COMP RESULTS  08/27/14 10/09/14   BMI (kg/m^2) 35 31.3   Fat Mass (lbs) 84 66.0   Fat Free Mass (lbs) 107.5 105.0   Total Body Water (lbs) 78.5 77.0    The following the learning objectives were met by the patient during this course:  Identifies Phase 3A (Soft, High Proteins) Dietary Goals and will begin from 2 weeks post-operatively to 2 months post-operatively  Identifies appropriate sources of fluids and proteins   States protein recommendations and appropriate sources post-operatively  Identifies the need for appropriate texture modifications, mastication, and bite sizes when consuming solids  Identifies appropriate multivitamin and calcium sources post-operatively  Describes the need for physical activity post-operatively and will follow MD recommendations  States when to call healthcare provider regarding medication questions or post-operative complications  Handouts given during class include:  Phase 3A: Soft, High Protein Diet Handout  Follow-Up Plan: Patient will follow-up at [facility] in 6 weeks for 2 month post-op nutrition visit for diet advancement per MD.

## 2014-11-22 ENCOUNTER — Ambulatory Visit: Payer: BC Managed Care – PPO | Admitting: Dietician

## 2014-11-29 ENCOUNTER — Ambulatory Visit (INDEPENDENT_AMBULATORY_CARE_PROVIDER_SITE_OTHER): Payer: BC Managed Care – PPO | Admitting: Internal Medicine

## 2014-11-29 ENCOUNTER — Encounter: Payer: Self-pay | Admitting: Internal Medicine

## 2014-11-29 VITALS — BP 106/68 | HR 76 | Temp 97.9°F | Wt 149.0 lb

## 2014-11-29 DIAGNOSIS — J069 Acute upper respiratory infection, unspecified: Secondary | ICD-10-CM | POA: Diagnosis not present

## 2014-11-29 MED ORDER — METHYLPREDNISOLONE ACETATE 80 MG/ML IJ SUSP
80.0000 mg | Freq: Once | INTRAMUSCULAR | Status: AC
  Administered 2014-11-29: 80 mg via INTRAMUSCULAR

## 2014-11-29 NOTE — Patient Instructions (Signed)

## 2014-11-29 NOTE — Addendum Note (Signed)
Addended by: Roena Malady on: 11/29/2014 11:28 AM   Modules accepted: Orders

## 2014-11-29 NOTE — Progress Notes (Signed)
Subjective:    Patient ID: Holly Farley, female    DOB: 21-Jan-1981, 34 y.o.   MRN: [id number]  HPI [name] is a 34 year old female who presents today with chief complaint of body aches for 6 days, runny nose,and intermittent hearing loss.  She denies fever.  She has taken OTC nasal spray, zyrtec, Nyquil and tylenol with minimal relief.  Denies any sick contacts.  She has a past medical history of DM, and her fasting CBG's have all been under 120.    Review of Systems  Constitutional: Positive for chills and fatigue. Negative for fever.  HENT: Positive for postnasal drip. Negative for ear pain (feels like her ears are full ), sinus pressure, sneezing and sore throat.   Respiratory: Negative for cough, shortness of breath and wheezing.   Cardiovascular: Negative for chest pain, palpitations and leg swelling.  Gastrointestinal: Negative for diarrhea, constipation and abdominal distention.  Genitourinary: Negative for dysuria, frequency and flank pain.  Musculoskeletal: Positive for myalgias. Negative for arthralgias and gait problem.  Skin: Negative for color change, pallor, rash and wound.  Neurological: Negative for light-headedness and headaches.  Psychiatric/Behavioral: Negative for agitation. The patient is not nervous/anxious.    Family History  Problem Relation Age of Onset  . Bipolar disorder Mother   . Migraines Mother   . Bipolar disorder Brother   . Diabetes Other    Current Outpatient Prescriptions on File Prior to Visit  Medication Sig Dispense Refill  . Blood Glucose Monitoring Suppl (ONE TOUCH ULTRA MINI) W/DEVICE KIT Use to test blood sugar daily as instructed. 1 each 0  . [name] Take 1 tablet by mouth 3 (three) times daily.     . Cyanocobalamin ([name]) 2500 MCG SUBL Place 2,500 mcg under the tongue daily.    Marland Kitchen glucose blood (ONE TOUCH TEST STRIPS) test strip Use as instructed 100 each 12  . metFORMIN (GLUCOPHAGE-XR) 500  MG 24 hr tablet Take 1000 mg by mouth 2x daily with meals. 120 tablet 11  . metroNIDAZOLE (METROGEL) 1 % gel Apply topically daily. 45 g 0  . Multiple Vitamin (MULTIVITAMIN WITH MINERALS) TABS tablet Take 1 tablet by mouth daily.    . ondansetron (ZOFRAN ODT) 4 MG disintegrating tablet Take 1 tablet (4 mg total) by mouth every 8 (eight) hours as needed for nausea or vomiting. 12 tablet 0   No current facility-administered medications on file prior to visit.        Objective:   Physical Exam  Constitutional: She is oriented to person, place, and time. She appears well-developed and well-nourished. No distress.  HENT:  Head: Normocephalic and atraumatic.  Right Ear: External ear normal.  Left Ear: External ear normal.  Mouth/Throat: Oropharynx is clear and moist. No oropharyngeal exudate.  Neck: Normal range of motion. Neck supple.  Sub mandibular glands are enlarged and tender  Cardiovascular: Normal rate, regular rhythm and normal heart sounds.   Pulmonary/Chest: Effort normal and breath sounds normal.  Abdominal: Soft. Bowel sounds are normal.  Musculoskeletal: Normal range of motion.  Lymphadenopathy:    She has no cervical adenopathy.  Neurological: She is alert and oriented to person, place, and time.  Skin: Skin is warm and dry. She is not diaphoretic.  Psychiatric: She has a normal mood and affect.   BP 106/68 mmHg  Pulse 76  Temp(Src) 97.9 F (36.6 C) (Oral)  Wt 149 lb (67.586 kg)  SpO2 99%  LMP 10/26/2014  Assessment & Plan:  1. Viral URI Advised patient to use Flonase twice a day and Allegra -d at night.  If not feeling better in 3 days, contact office.  Can take tylenol for muscle aches, patient aware that she cannot take ibuprofen due to recent gastric bypass surgery.

## 2014-11-29 NOTE — Progress Notes (Signed)
HPI  Pt presents to the clinic today headache, ear fullness, and runny nose. This started 6 days ago. She is blowing clear mucous out of her nose. She does have a decreased appetite but denies nausea, vomiting or diarrhea. She denies sore throat or cough. She denies fever but has had chills and body aches. She has tried nasal spray, Zyrtec, Allegra D, Nyquil and Tylenol with minimal relief. She has no history of allergies or breathing problems. She has had sick contacts.  She does have DM 2. Her last A1C was 10.8%. She does follow with Dr. Lafe Garin, endocrinology. Her fasting blood sugars have ranged 110-120.  Review of Systems    Past Medical History  Diagnosis Date  . PCOS (polycystic ovarian syndrome)     with infertility, followed by [facility]  . Insulin resistance   . Migraines   . Diabetes mellitus without complication     Family History  Problem Relation Age of Onset  . Bipolar disorder Mother   . Migraines Mother   . Bipolar disorder Brother   . Diabetes Other     Social History   Social History  . Marital Status: Married    Spouse Name: N/A  . Number of Children: N/A  . Years of Education: N/A   Occupational History  . [employer], going back to school    Social History Main Topics  . Smoking status: Former Games developer  . Smokeless tobacco: Never Used  . Alcohol Use: No  . Drug Use: No  . Sexual Activity: Not on file   Other Topics Concern  . Not on file   Social History Narrative    No Known Allergies   Constitutional: Positive headache, fatigue. Denies fever or abrupt weight changes.  HEENT:  Positive ear fullness and runny nose. Denies eye redness, ear pain, ringing in the ears, wax buildup, nasal congestion or sore throat. Respiratory: Denies cough, difficulty breathing or shortness of breath.  Cardiovascular: Denies chest pain, chest tightness, palpitations or swelling in the hands or feet.   No other specific complaints in a complete review of systems  (except as listed in HPI above).  Objective:  BP 106/68 mmHg  Pulse 76  Temp(Src) 97.9 F (36.6 C) (Oral)  Wt 149 lb (67.586 kg)  SpO2 99%  LMP 10/26/2014   General: Appears her stated age,  in NAD. HEENT: Head: normal shape and size, no sinus tenderness noted; Eyes: sclera white, no icterus, conjunctiva pink; Ears: Tm's gray and intact, normal light reflex; Nose: mucosa pink and moist, septum midline; Throat/Mouth: + PND. Teeth present, mucosa pink and moist, no exudate noted, no lesions or ulcerations noted.  Neck:  No cervical adenopathy noted. Cardiovascular: Normal rate and rhythm. S1,S2 noted.  No murmur, rubs or gallops noted.  Pulmonary/Chest: Normal effort and positive vesicular breath sounds. No respiratory distress. No wheezes, rales or ronchi noted.      Assessment & Plan:   Viral URI  Get some rest and drink plenty of fluids Continue Tylenol as needed (can not take Ibuprofen secondary to recent gastric bypass) Continue Allegra D Start Flonase at night 80 mg Depo IM today  RTC as needed or if symptoms persist.

## 2014-12-13 ENCOUNTER — Encounter: Payer: Self-pay | Admitting: Dietician

## 2014-12-13 ENCOUNTER — Encounter: Payer: BC Managed Care – PPO | Attending: Surgery | Admitting: Dietician

## 2014-12-13 DIAGNOSIS — Z713 Dietary counseling and surveillance: Secondary | ICD-10-CM | POA: Diagnosis not present

## 2014-12-13 DIAGNOSIS — Z6835 Body mass index (BMI) 35.0-35.9, adult: Secondary | ICD-10-CM | POA: Insufficient documentation

## 2014-12-13 NOTE — Patient Instructions (Addendum)
Goals:  Follow Phase 3B: High Protein + Non-Starchy Vegetables  Eat 3-6 small meals/snacks, every 3-5 hrs  Increase lean protein foods to meet 60g goal  Increase fluid intake to 64oz +  Avoid drinking 15 minutes before, during and 30 minutes after eating  Aim for >30 min of physical activity daily  TANITA  BODY COMP RESULTS  08/27/14 10/09/14 12/13/14   BMI (kg/m^2) 35 31.3 26.7   Fat Mass (lbs) 84 66.0 46.5   Fat Free Mass (lbs) 107.5 105.0 99.5   Total Body Water (lbs) 78.5 77.0 73

## 2014-12-13 NOTE — Progress Notes (Signed)
  Follow-up visit:  11 Weeks Post-Operative RYGB Surgery  Medical Nutrition Therapy:  Appt start time: 440 end time:  515  Primary concerns today: Post-operative Bariatric Surgery Nutrition Management.  Holly Farley returns having lost another 25 pounds. She reports her migraines have improved. Blood sugars are also improving. Has a hard time with beef that is not ground and sensitive to some textures. She weighs herself daily but states that this does not govern how much she eats throughout the day. Mobility and energy level have greatly improved!  Surgery date: 09/24/14 Surgery type: RYGB [facility]: [facility] lbs on 08/20/14 Weight today: 146 lbs Weight change: 25 lbs Total weight lost: 38 lbs  Goal weight: 125-130 lbs  TANITA  BODY COMP RESULTS  08/27/14 10/09/14 12/13/14   BMI (kg/m^2) 35 31.3 26.7   Fat Mass (lbs) 84 66.0 46.5   Fat Free Mass (lbs) 107.5 105.0 99.5   Total Body Water (lbs) 78.5 77.0 73    Preferred Learning Style:   No preference indicated   Learning Readiness:   Ready  24-hr recall: B (AM): [place] yogurt or cottage cheese with fruit or GenePro or eggs (12-30g) Snk (AM): 8 cheese cubes or cheese stick or celery with [place] yogurt + ranch mix (7-14g)   L (PM): 1/2 cup pinto beans or refried beans or deli meat and cheese or meatloaf (7-14g)  Snk (PM):   D (PM): 2-3 oz chicken with zucchini and squash (14-21g) Snk (PM):   Fluid intake: skim milk, water, protein shake (80+ oz total) Estimated total protein intake: 60+ grams per day  Medications: see list Supplementation: taking  CBG monitoring: 1-3x a day Average CBG per patient: 100-120 mg/dL Last patient reported Z6XA1c: none since surgery  Using straws: no Drinking while eating: no Hair loss: has noticed some shedding; taking Biotin Carbonated beverages: none N/V/D/C: none recently Dumping syndrome: none  Recent physical activity:  30 minutes of intervals on treadmill + weight training  Progress  Towards Goal(s):  In progress.  Handouts given during visit include:  Phase 3B lean protein + non starchy vegetables   Nutritional Diagnosis:  [place]-3.3 Overweight/obesity related to past poor dietary habits and physical inactivity as evidenced by patient w/ recent RYGB surgery following dietary guidelines for continued weight loss.     Intervention:  Nutrition counseling provided.  Teaching Method Utilized:  Visual Auditory Hands on  Barriers to learning/adherence to lifestyle change: none  Demonstrated degree of understanding via:  Teach Back   Monitoring/Evaluation:  Dietary intake, exercise, and body weight. Follow up in 4-6 weeks for 3.5 month post-op visit.

## 2015-01-22 ENCOUNTER — Encounter: Payer: Self-pay | Admitting: Dietician

## 2015-01-22 ENCOUNTER — Encounter: Payer: BC Managed Care – PPO | Attending: Surgery | Admitting: Dietician

## 2015-01-22 DIAGNOSIS — Z6835 Body mass index (BMI) 35.0-35.9, adult: Secondary | ICD-10-CM | POA: Insufficient documentation

## 2015-01-22 DIAGNOSIS — Z713 Dietary counseling and surveillance: Secondary | ICD-10-CM | POA: Insufficient documentation

## 2015-01-22 NOTE — Progress Notes (Signed)
  Follow-up visit:  4 months Post-Operative RYGB Surgery  Medical Nutrition Therapy:  Appt start time: 440 end time:  500  Primary concerns today: Post-operative Bariatric Surgery Nutrition Management.  Holly Farley returns having lost another 8.5 pounds. Has not been able to work out as much lately because she has been busy with work. She reports that she is getting bored with her food options. Still making sure to get at least 60 grams of protein per day. Drinking shakes if she feels unable to eat, usually drinks about 1 per day. She states that she would feel okay if she didn't lose any more weight but she would like to lose a little bit more.   Surgery date: 09/24/14 Surgery type: RYGB [facility] lbs on 08/20/14 Weight today: 137.5 lbs Weight change: 8.5 lbs Total weight lost: 46.5 lbs  Goal weight: 125-130 lbs  TANITA  BODY COMP RESULTS  08/27/14 10/09/14 12/13/14 01/22/15   BMI (kg/m^2) 35 31.3 26.7 25.1   Fat Mass (lbs) 84 66.0 46.5 39   Fat Free Mass (lbs) 107.5 105.0 99.5 98   Total Body Water (lbs) 78.5 77.0 73 72    Preferred Learning Style:   No preference indicated   Learning Readiness:   Ready  24-hr recall: B (AM): [place] yogurt or cottage cheese with fruit or GenePro or eggs (12-30g) Snk (AM): 8 cheese cubes or cheese stick or celery with [place] yogurt + ranch mix (7-14g)   L (PM): 1/2 cup pinto beans or refried beans or deli meat and cheese or meatloaf (7-14g)  Snk (PM):   D (PM): 2-3 oz chicken with zucchini and squash (14-21g) Snk (PM):   Fluid intake: skim milk, water, protein shake (80+ oz total) Estimated total protein intake: 60+ grams per day  Medications: see list Supplementation: taking  CBG monitoring: 1-3x a day Average CBG per patient: 100-120 mg/dL Last patient reported G9FA1c: none since surgery  Using straws: no Drinking while eating: no Hair loss: has noticed some shedding; taking Biotin Carbonated beverages: none N/V/D/C: none  recently Dumping syndrome: none  Recent physical activity:  None recently  Progress Towards Goal(s):  In progress.  Handouts given during visit include:  Bariatric snack list   Nutritional Diagnosis:  [place]-3.3 Overweight/obesity related to past poor dietary habits and physical inactivity as evidenced by patient w/ recent RYGB surgery following dietary guidelines for continued weight loss.     Intervention:  Nutrition counseling provided.  Teaching Method Utilized:  Visual Auditory Hands on  Barriers to learning/adherence to lifestyle change: none  Demonstrated degree of understanding via:  Teach Back   Monitoring/Evaluation:  Dietary intake, exercise, and body weight. Follow up in 4-6 weeks for 5.5 month post-op visit.

## 2015-01-22 NOTE — Patient Instructions (Addendum)
Goals:  Follow Phase 3B: High Protein + Non-Starchy Vegetables  Eat 3-6 small meals/snacks, every 3-5 hrs  Increase lean protein foods to meet 60g goal  Increase fluid intake to 64oz +  Avoid drinking 15 minutes before, during and 30 minutes after eating  Aim for >30 min of physical activity daily  TANITA  BODY COMP RESULTS  08/27/14 10/09/14 12/13/14 01/22/15   BMI (kg/m^2) 35 31.3 26.7 25.1   Fat Mass (lbs) 84 66.0 46.5 39   Fat Free Mass (lbs) 107.5 105.0 99.5 98   Total Body Water (lbs) 78.5 77.0 73 72

## 2015-03-01 ENCOUNTER — Telehealth: Payer: Self-pay | Admitting: Family Medicine

## 2015-03-01 ENCOUNTER — Other Ambulatory Visit: Payer: Self-pay | Admitting: Internal Medicine

## 2015-03-01 ENCOUNTER — Other Ambulatory Visit (INDEPENDENT_AMBULATORY_CARE_PROVIDER_SITE_OTHER): Payer: BC Managed Care – PPO

## 2015-03-01 DIAGNOSIS — E1165 Type 2 diabetes mellitus with hyperglycemia: Secondary | ICD-10-CM

## 2015-03-01 LAB — LIPID PANEL
CHOL/HDL RATIO: 3
Cholesterol: 150 mg/dL (ref 0–200)
HDL: 51.7 mg/dL (ref >39.00)
LDL Cholesterol: 82 mg/dL (ref 0–99)
NONHDL: 98.56
Triglycerides: 84 mg/dL (ref 0.0–149.0)
VLDL: 16.8 mg/dL (ref 0.0–40.0)

## 2015-03-01 LAB — HEMOGLOBIN A1C: HEMOGLOBIN A1C: 5.9 % (ref 4.6–6.5)

## 2015-03-01 NOTE — Telephone Encounter (Signed)
Cholesterol looks great. A1C down to 5.9%

## 2015-03-01 NOTE — Telephone Encounter (Signed)
Pt was told labs from this morning would be back by this afternoon. Pt would like call back with results. Please advise

## 2015-03-01 NOTE — Telephone Encounter (Signed)
Spoke to pt and informed her of results.  

## 2015-03-06 ENCOUNTER — Encounter: Payer: BC Managed Care – PPO | Attending: Surgery | Admitting: Dietician

## 2015-03-06 DIAGNOSIS — Z713 Dietary counseling and surveillance: Secondary | ICD-10-CM | POA: Diagnosis not present

## 2015-03-06 DIAGNOSIS — Z6835 Body mass index (BMI) 35.0-35.9, adult: Secondary | ICD-10-CM | POA: Insufficient documentation

## 2015-03-06 NOTE — Progress Notes (Signed)
  Follow-up visit:  5.5 months Post-Operative RYGB Surgery  Medical Nutrition Therapy:  Appt start time: 445 end time:  515  Primary concerns today: Post-operative Bariatric Surgery Nutrition Management.  Holly Boozeasha returns having lost another 3 pounds. She has not been exercising like she would like to as it is difficult for her to find time around work and her family. Her recent labwork (lipids and hgbA1c) were excellent. She reports that she is still getting bored with her food options although she has been experimenting with new recipes. Meeting protein needs; still sometimes supplements with protein shake.   Surgery date: 09/24/14 Surgery type: RYGB [facility] lbs on 08/20/14 Weight today: 134.5 lbs Weight change: 3 lbs Total weight lost: 49.5 lbs Goal weight: 125-130 lbs  TANITA  BODY COMP RESULTS  08/27/14 10/09/14 12/13/14 01/22/15 03/06/15   BMI (kg/m^2) 35 31.3 26.7 25.1 24.6   Fat Mass (lbs) 84 66.0 46.5 39 39.5   Fat Free Mass (lbs) 107.5 105.0 99.5 98 95   Total Body Water (lbs) 78.5 77.0 73 72 69.5    Preferred Learning Style:   No preference indicated   Learning Readiness:   Ready  24-hr recall: B (AM): [place] yogurt or cottage cheese with fruit or GenePro or eggs (12-30g) Snk (AM): 8 cheese cubes or cheese stick or celery with [place] yogurt + ranch mix (7-14g)   L (PM): 1/2 cup pinto beans or refried beans or deli meat and cheese or meatloaf (7-14g)  Snk (PM):   D (PM): 2-3 oz chicken with zucchini and squash (14-21g) Snk (PM):   Fluid intake: skim milk, water, protein shake (80+ oz total) Estimated total protein intake: 60+ grams per day  Medications: see list Supplementation: taking  CBG monitoring: 1-3x a day Average CBG per patient: 100-120 mg/dL Last patient reported Z6XA1c: 5.9%  Using straws: no Drinking while eating: no Hair loss: has noticed some shedding; taking Biotin Carbonated beverages: none N/V/D/C: none recently Dumping syndrome:  none  Recent physical activity:  None recently  Progress Towards Goal(s):  In progress.    Nutritional Diagnosis:  [place]-3.3 Overweight/obesity related to past poor dietary habits and physical inactivity as evidenced by patient w/ recent RYGB surgery following dietary guidelines for continued weight loss.     Intervention:  Nutrition counseling provided.  Teaching Method Utilized:  Visual Auditory Hands on  Barriers to learning/adherence to lifestyle change: none  Demonstrated degree of understanding via:  Teach Back   Monitoring/Evaluation:  Dietary intake, exercise, and body weight. Follow up in 6 weeks for 7 month post-op visit.

## 2015-03-06 NOTE — Patient Instructions (Addendum)
Goals:  Follow Phase 3B: High Protein + Non-Starchy Vegetables  Eat 3-6 small meals/snacks, every 3-5 hrs  Increase lean protein foods to meet 60g goal  Increase fluid intake to 64oz +  Avoid drinking 15 minutes before, during and 30 minutes after eating  As you add carbohydrates, keep an eye on your blood sugars   Always pair a protein with carbs  Aim for >30 min of physical activity daily  Find time for YOU!    TANITA  BODY COMP RESULTS  08/27/14 10/09/14 12/13/14 01/22/15 03/06/15   BMI (kg/m^2) 35 31.3 26.7 25.1 24.6   Fat Mass (lbs) 84 66.0 46.5 39 39.5   Fat Free Mass (lbs) 107.5 105.0 99.5 98 95   Total Body Water (lbs) 78.5 77.0 73 72 69.5

## 2015-03-07 ENCOUNTER — Encounter: Payer: Self-pay | Admitting: Dietician

## 2015-05-09 ENCOUNTER — Ambulatory Visit: Payer: BC Managed Care – PPO | Admitting: Dietician

## 2015-07-09 ENCOUNTER — Ambulatory Visit (INDEPENDENT_AMBULATORY_CARE_PROVIDER_SITE_OTHER): Payer: BC Managed Care – PPO | Admitting: Family Medicine

## 2015-07-09 ENCOUNTER — Encounter: Payer: Self-pay | Admitting: Family Medicine

## 2015-07-09 VITALS — BP 104/62 | HR 65 | Temp 97.9°F | Wt 137.5 lb

## 2015-07-09 DIAGNOSIS — E119 Type 2 diabetes mellitus without complications: Secondary | ICD-10-CM

## 2015-07-09 DIAGNOSIS — J069 Acute upper respiratory infection, unspecified: Secondary | ICD-10-CM

## 2015-07-09 LAB — HEMOGLOBIN A1C: Hgb A1c MFr Bld: 5.6 % (ref 4.6–6.5)

## 2015-07-09 MED ORDER — AMOXICILLIN 875 MG PO TABS: 875.0000 mg | ORAL_TABLET | Freq: Two times a day (BID) | ORAL | Status: DC

## 2015-07-09 NOTE — Progress Notes (Signed)
SUBJECTIVE:  Holly Farley is a 35 y.o. female who complains of coryza, bilateral sinus pressure, chills and congestion for 8 days. She denies a history of anorexia and chest pain and denies a history of asthma. Patient denies smoke cigarettes.   Current Outpatient Prescriptions on File Prior to Visit  Medication Sig Dispense Refill  . Blood Glucose Monitoring Suppl (ONE TOUCH ULTRA MINI) W/DEVICE KIT Use to test blood sugar daily as instructed. 1 each 0  . [name] Take 1 tablet by mouth 3 (three) times daily.     . Cyanocobalamin ([name]) 2500 MCG SUBL Place 2,500 mcg under the tongue daily.    Marland Kitchen glucose blood (ONE TOUCH TEST STRIPS) test strip Use as instructed 100 each 12  . metroNIDAZOLE (METROGEL) 1 % gel Apply topically daily. 45 g 0  . Multiple Vitamin (MULTIVITAMIN WITH MINERALS) TABS tablet Take 1 tablet by mouth daily.    . ondansetron (ZOFRAN ODT) 4 MG disintegrating tablet Take 1 tablet (4 mg total) by mouth every 8 (eight) hours as needed for nausea or vomiting. 12 tablet 0  . metFORMIN (GLUCOPHAGE-XR) 500 MG 24 hr tablet Take 1000 mg by mouth 2x daily with meals. (Patient not taking: Reported on 07/09/2015) 120 tablet 11   No current facility-administered medications on file prior to visit.    No Known Allergies  Past Medical History  Diagnosis Date  . PCOS (polycystic ovarian syndrome)     with infertility, followed by [facility]  . Insulin resistance   . Migraines   . Diabetes mellitus without complication [facility])     Past Surgical History  Procedure Laterality Date  . Esophagogastroduodenoscopy    . Cesarean section    . Tubal ligation    . 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 Bipolar disorder Mother   . Migraines Mother   .  Bipolar disorder Brother   . Diabetes Other     Social History   Social History  . Marital Status: Married    Spouse Name: N/A  . Number of Children: N/A  . Years of Education: N/A   Occupational History  . [employer], going back to school    Social History Main Topics  . Smoking status: Former Research scientist (life sciences)  . Smokeless tobacco: Never Used  . Alcohol Use: No  . Drug Use: No  . Sexual Activity: Not on file   Other Topics Concern  . Not on file   Social History Narrative   The PMH, PSH, Social History, Family History, Medications, and allergies have been reviewed in [facility], and have been updated if relevant.  OBJECTIVE: BP 104/62 mmHg  Pulse 65  Temp(Src) 97.9 F (36.6 C) (Oral)  Wt 137 lb 8 oz (62.37 kg)  SpO2 98%  LMP 07/02/2015  She appears well, vital signs are as noted. Ears normal.  Throat and pharynx normal.  Neck supple. No adenopathy in the neck. Nose is congested. Sinuses tender. The chest is clear, without wheezes or rales.  ASSESSMENT:  sinusitis  PLAN: Given duration and progression of symptoms, will treat for bacterial sinusitis.  Symptomatic therapy suggested: push fluids, rest and return office visit prn if symptoms persist or worsen.  Call or return to clinic prn if these symptoms worsen or  fail to improve as anticipated.

## 2015-07-09 NOTE — Progress Notes (Signed)
Pre visit review using our clinic review tool, if applicable. No additional management support is needed unless otherwise documented below in the visit note. 

## 2016-01-27 ENCOUNTER — Other Ambulatory Visit (HOSPITAL_COMMUNITY)
Admission: RE | Admit: 2016-01-27 | Discharge: 2016-01-27 | Disposition: A | Discharge status: Home / Self Care | Payer: BC Managed Care – PPO | Source: Ambulatory Visit | Attending: Family Medicine | Admitting: Family Medicine

## 2016-01-27 ENCOUNTER — Ambulatory Visit (INDEPENDENT_AMBULATORY_CARE_PROVIDER_SITE_OTHER): Payer: BC Managed Care – PPO | Admitting: Family Medicine

## 2016-01-27 ENCOUNTER — Encounter: Payer: Self-pay | Admitting: Family Medicine

## 2016-01-27 VITALS — BP 114/62 | HR 87 | Temp 98.2°F | Ht 62.0 in | Wt 135.8 lb

## 2016-01-27 DIAGNOSIS — Z1151 Encounter for screening for human papillomavirus (HPV): Secondary | ICD-10-CM | POA: Diagnosis present

## 2016-01-27 DIAGNOSIS — Z01419 Encounter for gynecological examination (general) (routine) without abnormal findings: Secondary | ICD-10-CM | POA: Diagnosis present

## 2016-01-27 DIAGNOSIS — Z9884 Bariatric surgery status: Secondary | ICD-10-CM

## 2016-01-27 LAB — COMPREHENSIVE METABOLIC PANEL
ALT: 20 U/L (ref 0–35)
AST: 16 U/L (ref 0–37)
Albumin: 4.5 g/dL (ref 3.5–5.2)
Alkaline Phosphatase: 41 U/L (ref 39–117)
BUN: 9 mg/dL (ref 6–23)
CALCIUM: 9.2 mg/dL (ref 8.4–10.5)
CHLORIDE: 102 meq/L (ref 96–112)
CO2: 30 mEq/L (ref 19–32)
Creatinine, Ser: 0.71 mg/dL (ref 0.40–1.20)
GFR: 100.3 mL/min (ref >60.00)
Glucose, Bld: 87 mg/dL (ref 70–99)
POTASSIUM: 3.7 meq/L (ref 3.5–5.1)
Sodium: 139 mEq/L (ref 135–145)
Total Bilirubin: 0.4 mg/dL (ref 0.2–1.2)
Total Protein: 6.8 g/dL (ref 6.0–8.3)

## 2016-01-27 LAB — LIPID PANEL
Cholesterol: 139 mg/dL (ref 0–200)
HDL: 60.2 mg/dL (ref >39.00)
LDL CALC: 58 mg/dL (ref 0–99)
NonHDL: 78.32
TRIGLYCERIDES: 102 mg/dL (ref 0.0–149.0)
Total CHOL/HDL Ratio: 2
VLDL: 20.4 mg/dL (ref 0.0–40.0)

## 2016-01-27 LAB — TSH: TSH: 0.9 u[IU]/mL (ref 0.35–4.50)

## 2016-01-27 LAB — HEMOGLOBIN A1C: Hgb A1c MFr Bld: 5.6 % (ref 4.6–6.5)

## 2016-01-27 NOTE — Addendum Note (Signed)
Addended by: Desmond DikeKNIGHT, Lizabeth Fellner H on: 01/27/2016 02:52 PM   Modules accepted: Orders

## 2016-01-27 NOTE — Assessment & Plan Note (Signed)
Reviewed preventive care protocols, scheduled due services, and updated immunizations Discussed nutrition, exercise, diet, and healthy lifestyle.  Orders Placed This Encounter  Procedures  . Hemoglobin A1c  . Lipid panel  . Comprehensive metabolic panel  . TSH

## 2016-01-27 NOTE — Patient Instructions (Signed)
Great to see you. We will call you with your results and you can see them online. 

## 2016-01-27 NOTE — Progress Notes (Signed)
Subjective:   Patient ID: Holly Farley, female    DOB: Jul 14, 1980, 35 y.o.   MRN: [id number]  Holly Farley is a pleasant 35 y.o. year old female who presents to clinic today with Annual Exam  on 01/27/2016  HPI:  S/p bariatric surgery 09/24/14.  Has lost over 50 pounds.  Doing well. Last pap smear was over 4 years ago. Denies any history of abnormal pap smears.  Lab Results  Component Value Date   CHOL 150 03/01/2015   HDL 51.70 03/01/2015   LDLCALC 82 03/01/2015   LDLDIRECT 101.9 05/26/2012   TRIG 84.0 03/01/2015   CHOLHDL 3 03/01/2015   Lab Results  Component Value Date   CREATININE 0.60 10/03/2014   Lab Results  Component Value Date   HGBA1C 5.6 07/09/2015     Current Outpatient Prescriptions on File Prior to Visit  Medication Sig Dispense Refill  . [name] Take 1 tablet by mouth 3 (three) times daily.     . Cyanocobalamin ([name]) 2500 MCG SUBL Place 2,500 mcg under the tongue daily.    . metroNIDAZOLE (METROGEL) 1 % gel Apply topically daily. 45 g 0  . Multiple Vitamin (MULTIVITAMIN WITH MINERALS) TABS tablet Take 1 tablet by mouth daily.     No current facility-administered medications on file prior to visit.     No Known Allergies  Past Medical History:  Diagnosis Date  . Diabetes mellitus without complication (HCC)   . Insulin resistance   . Migraines   . PCOS (polycystic ovarian syndrome)    with infertility, followed by [facility]    Past Surgical History:  Procedure Laterality Date  . CESAREAN SECTION    . ESOPHAGOGASTRODUODENOSCOPY    . GASTRIC ROUX-EN-Y N/A 09/24/2014   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;  Surgeon: Luretha MurphyMatthew Martin, MD;  Location: [facility];  Service: General;  Laterality: N/A;  . TUBAL LIGATION      Family History  Problem Relation Age of Onset  . Bipolar disorder Mother   . Migraines Mother   . Bipolar disorder Brother   . Diabetes Other     Social  History   Social History  . Marital status: Married    Spouse name: N/A  . Number of children: N/A  . Years of education: N/A   Occupational History  . [employer], going back to school [employer]   Social History Main Topics  . Smoking status: Former Games developermoker  . Smokeless tobacco: Never Used  . Alcohol use No  . Drug use: No  . Sexual activity: Not on file   Other Topics Concern  . Not on file   Social History Narrative  . No narrative on file   The PMH, PSH, Social History, Family History, Medications, and allergies have been reviewed in [facility], and have been updated if relevant.   Review of Systems  Constitutional: Negative.   HENT: Negative.   Respiratory: Negative.   Cardiovascular: Negative.   Gastrointestinal: Negative.   Endocrine: Negative.   Genitourinary: Negative.   Musculoskeletal: Negative.   Allergic/Immunologic: Negative.   Neurological: Negative.   Hematological: Negative.   Psychiatric/Behavioral: Negative.   All other systems reviewed and are negative.      Objective:    BP 114/62   Pulse 87   Temp 98.2 F (36.8 C) (Oral)   Ht 5\' 2"  (1.575 m)   Wt 135 lb 12 oz (61.6 kg)   LMP  01/14/2016   SpO2 98%   BMI 24.83 kg/m    Physical Exam   General:  Well-developed,well-nourished,in no acute distress; alert,appropriate and cooperative throughout examination Head:  normocephalic and atraumatic.   Eyes:  vision grossly intact, PERRL Ears:  R ear normal and L ear normal externally, TMs clear bilaterally Nose:  no external deformity.   Mouth:  good dentition.   Neck:  No deformities, masses, or tenderness noted. Breasts:  No mass, nodules, thickening, tenderness, bulging, retraction, inflamation, nipple discharge or skin changes noted.   Lungs:  Normal respiratory effort, chest expands symmetrically. Lungs are clear to auscultation, no crackles or wheezes. Heart:  Normal rate and regular rhythm. S1 and S2 normal without gallop, murmur,  click, rub or other extra sounds. Abdomen:  Bowel sounds positive,abdomen soft and non-tender without masses, organomegaly or hernias noted. Rectal:  no external abnormalities.   Genitalia:  Pelvic Exam:        External: normal female genitalia without lesions or masses        Vagina: normal without lesions or masses        Cervix: normal without lesions or masses        Adnexa: normal bimanual exam without masses or fullness        Uterus: normal by palpation        Pap smear: performed Msk:  No deformity or scoliosis noted of thoracic or lumbar spine.   Extremities:  No clubbing, cyanosis, edema, or deformity noted with normal full range of motion of all joints.   Neurologic:  alert & oriented X3 and gait normal.   Skin:  Intact without suspicious lesions or rashes Cervical Nodes:  No lymphadenopathy noted Axillary Nodes:  No palpable lymphadenopathy Psych:  Cognition and judgment appear intact. Alert and cooperative with normal attention span and concentration. No apparent delusions, illusions, hallucinations       Assessment & Plan:   Gastric bypass status for obesity July 2016  Well woman exam No Follow-up on file.

## 2016-01-29 ENCOUNTER — Encounter: Payer: Self-pay | Admitting: *Deleted

## 2016-01-29 LAB — CYTOLOGY - PAP
DIAGNOSIS: NEGATIVE
HPV (WINDOPATH): NOT DETECTED

## 2016-01-31 ENCOUNTER — Encounter: Payer: Self-pay | Admitting: *Deleted

## 2016-04-28 ENCOUNTER — Encounter (HOSPITAL_COMMUNITY): Payer: Self-pay

## 2016-06-03 ENCOUNTER — Encounter: Payer: Self-pay | Admitting: Skilled Nursing Facility1

## 2016-06-03 ENCOUNTER — Encounter: Payer: BC Managed Care – PPO | Attending: Surgery | Admitting: Skilled Nursing Facility1

## 2016-06-03 DIAGNOSIS — E8881 Metabolic syndrome: Secondary | ICD-10-CM | POA: Insufficient documentation

## 2016-06-03 DIAGNOSIS — Z713 Dietary counseling and surveillance: Secondary | ICD-10-CM | POA: Diagnosis present

## 2016-06-03 DIAGNOSIS — Z9884 Bariatric surgery status: Secondary | ICD-10-CM | POA: Diagnosis present

## 2016-06-03 DIAGNOSIS — E119 Type 2 diabetes mellitus without complications: Secondary | ICD-10-CM | POA: Insufficient documentation

## 2016-06-03 NOTE — Progress Notes (Signed)
Follow-up visit:  5.5 months Post-Operative RYGB Surgery  Medical Nutrition Therapy:  Appt start time: 445 end time:  515  Primary concerns today: Post-operative Bariatric Surgery Nutrition Management.   Pt states she urinates constantly, gets dry mouth, and does not feel like she sweats enough. Pt states she has PCOS.  Pt states she has been feeling sluggish and having trouble sleeping. Pt states she gets migraines from caffeine withdrawal but the caffeine not being the root cause. Pt states she typically has low blood pressure. Pt states she does not sleep well at night and only gets 20 minutes at lunch. Pt states she needs to see a neurologist for her migraines. Pt states sometimes she has trouble with dry meats.Pt states Sweets make her throw up. Pt states she has a pain in her lower back directly above her kidney on the right side. Pt states she has trouble with her children wanting different foods, her 76 year old throwing up when he does not want what is for dinner, and her husband as his own health issues he emotionally struggles with.    Surgery date: 09/24/14 Surgery type: RYGB Start weight at [facility]: 184 lbs on 08/20/14 Weight today: 13.9 lbs Weight change: 1 lbs gain  Goal weight: 125-130 lbs  TANITA  BODY COMP RESULTS  08/27/14 10/09/14 12/13/14 01/22/15 03/06/15 06/03/2016   BMI (kg/m^2) 35 31.3 26.7 25.1 24.6 24.8   Fat Mass (lbs) 84 66.0 46.5 39 39.5 40.4   Fat Free Mass (lbs) 107.5 105.0 99.5 98 95 95.2   Total Body Water (lbs) 78.5 77.0 73 72 69.5 66.6    Preferred Learning Style:   No preference indicated   Learning Readiness:   Ready  24-hr recall: B (AM): skipped or something grabable  Snk (AM):  L (PM): lunch meat and cheese stick (14g) Snk (PM):   D (PM): 2-3 oz chicken with zucchini and squash (14-21g) Snk (PM):   Fluid intake: skim milk, water, protein shake (80+ oz total) Estimated total protein intake: 60+ grams per day  Medications: see  list Supplementation: multi and one calcium and chewable vitamin d  CBG monitoring: no longer checking  Average CBG per patient:  Last patient reported A1c:   Using straws: no Drinking while eating: no Hair loss: no Carbonated beverages: none N/V/D/C: none recently Dumping syndrome: none Changes in vision: no Changes to mood/headaches: no Hair loss/Cahnges to skin/Changes to nails: no Any difficulty focusing or concentrating: no Sweating: no Dizziness/Lightheaded: no Palpitations: no Carbonated beverages: no N/V/D/C/GAS: no  Recent physical activity:  Co[employer] softball and indoor soccer  Progress Towards Goal(s):  In progress.  Nutritional Diagnosis:  [place]-3.3 Overweight/obesity related to past poor dietary habits and physical inactivity as evidenced by patient w/ recent RYGB surgery following dietary guidelines for continued weight loss. Intervention:  Nutrition counseling provided. Goals: -Look up [employer] -Get a referral for your child to [facility] from their physician to see Danise Edge  -Any sauces with a bunch of zeros on the nutrition facts label: also mustard, soy sauce, w sauce, vinegar, skinny girl dressings, plain non-fat greek yogurt with ranch packet -Try the tuna packets with flavors -Look at the yogurt aisle and try Kefir cups yogurt try the kefir smoothie  -Start taking a specific multivitamin  -Make an appointment with your surgeon  -You have maintained your weight! Great Job! Teaching Method Utilized:  Visual Auditory Hands on  Barriers to learning/adherence to lifestyle change: none  Demonstrated degree  of understanding via:  Teach Back   Monitoring/Evaluation:  Dietary intake, exercise, and body weight. Follow up in 6 weeks for 7 month post-op visit.

## 2016-06-03 NOTE — Patient Instructions (Addendum)
-  Look up [employer]  -Get a referral for your child to [facility] from their physician to see Danise Edge   -Any sauces with a bunch of zeros on the nutrition facts label: also mustard, soy sauce, w sauce, vinegar, skinny girl dressings, plain non-fat greek yogurt with ranch packet  -Try the tuna packets with flavors  -Look at the yogurt aisle and try Kefir cups yogurt try the kefir smoothie   -Start taking a specific multivitamin   -Make an appointment with your surgeon   -You have maintained your weight! Great Job!

## 2016-09-01 ENCOUNTER — Encounter: Payer: BC Managed Care – PPO | Attending: Surgery | Admitting: Skilled Nursing Facility1

## 2016-09-01 ENCOUNTER — Encounter: Payer: Self-pay | Admitting: Skilled Nursing Facility1

## 2016-09-01 DIAGNOSIS — E8881 Metabolic syndrome: Secondary | ICD-10-CM | POA: Insufficient documentation

## 2016-09-01 DIAGNOSIS — Z9884 Bariatric surgery status: Secondary | ICD-10-CM | POA: Insufficient documentation

## 2016-09-01 DIAGNOSIS — E119 Type 2 diabetes mellitus without complications: Secondary | ICD-10-CM | POA: Diagnosis present

## 2016-09-01 DIAGNOSIS — Z713 Dietary counseling and surveillance: Secondary | ICD-10-CM | POA: Diagnosis not present

## 2016-09-01 NOTE — Progress Notes (Signed)
RYGB Surgery  Medical Nutrition Therapy:  Appt start time: 445 end time:  515  Primary concerns today: Post-operative Bariatric Surgery Nutrition Management. Pt states due to Vacation and school trips she has been stressed and eating fast food which causes getting sick with either headaches or nausea or both, bread/pasta/rice bad headache and vomiting, sweets will cause vomit. Pt states she Likes the tuna pouch. Pt states lately she has  Taken a step back from caring for herself. Pt states her 1 day a week 2 hours playng soccer has been a wonderful stress release. Pt states her husband had low testosterone and thyroid which has caused a lot of emotion issues and energy lacking which strains the family. Pt spent the appointment describing her stresses and how the kids need their own meals and her husband needs his own meal and her husband cannot express his emotions which are all stressors for her which makes her want to eat bread which she knows will make her sick.  Surgery date: 09/24/14 Surgery type: RYGB [facility] lbs on 08/20/14 Weight today: 136.4 lbs  TANITA  BODY COMP RESULTS  08/27/14 10/09/14 12/13/14 01/22/15 03/06/15 06/03/2016 09/01/2016   BMI (kg/m^2) 35 31.3 26.7 25.1 24.6 24.8 24.9   Fat Mass (lbs) 84 66.0 46.5 39 39.5 40.4 41.4   Fat Free Mass (lbs) 107.5 105.0 99.5 98 95 95.2 95   Total Body Water (lbs) 78.5 77.0 73 72 69.5 66.6 66.4    Preferred Learning Style:   No preference indicated   Learning Readiness:   Ready  24-hr recall: B (AM): skipped or something grabable  Snk (AM): sargento breakfast break  L (PM): lunch meat and cheese stick (14g) Snk (PM):   D (PM): 2-3 oz chicken with zucchini and squash (14-21g) Snk (PM):   Fluid intake: skim milk, water, protein shake (80+ oz total) Estimated total protein intake: 60+ grams per day  Medications: see list Supplementation: multi and one calcium and chewable vitamin d  CBG monitoring: no longer  checking  Average CBG per patient:  Last patient reported A1c:   Using straws: no Drinking while eating: no Hair loss: no Carbonated beverages: none N/V/D/C: none recently Dumping syndrome: none Changes in vision: no Changes to mood/headaches: no Hair loss/Cahnges to skin/Changes to nails: no Any difficulty focusing or concentrating: no Sweating: no Dizziness/Lightheaded: no Palpitations: no Carbonated beverages: no N/V/D/C/GAS: vomiting  Recent physical activity:  Co[employer] softball and indoor soccer  Progress Towards Goal(s):  In progress.  Nutritional Diagnosis:  [place]-3.3 Overweight/obesity related to past poor dietary habits and physical inactivity as evidenced by patient w/ recent RYGB surgery following dietary guidelines for continued weight loss. Intervention:  Nutrition counseling provided. Goals: -Look up [employer] -Get a referral for your child to [facility] Diabetes Education [facility] from their physician to see Danise EdgeLaura Watson  -Any sauces with a bunch of zeros on the nutrition facts label: also mustard, soy sauce, w sauce, vinegar, skinny girl dressings, plain non-fat greek yogurt with ranch packet -Look at the yogurt aisle and try Kefir cups yogurt try the kefir smoothie  -Start taking a specific multivitamin  -Make an appointment with your surgeon  -You have maintained your weight! Great Job! Teaching Method Utilized:  Visual Auditory Hands on  Barriers to learning/adherence to lifestyle change: none  Demonstrated degree of understanding via:  Teach Back   Monitoring/Evaluation:  Dietary intake, exercise, and body weight. Follow up in 6 weeks for 7 month post-op visit.

## 2017-02-10 ENCOUNTER — Telehealth: Payer: Self-pay | Admitting: Nurse Practitioner

## 2017-02-10 ENCOUNTER — Encounter: Payer: Self-pay | Admitting: Nurse Practitioner

## 2017-02-10 ENCOUNTER — Ambulatory Visit: Payer: BC Managed Care – PPO | Admitting: Nurse Practitioner

## 2017-02-10 VITALS — BP 98/64 | HR 84 | Temp 97.8°F | Ht 62.0 in | Wt 136.0 lb

## 2017-02-10 DIAGNOSIS — J0140 Acute pansinusitis, unspecified: Secondary | ICD-10-CM

## 2017-02-10 MED ORDER — LEVOFLOXACIN 250 MG PO TABS: 500.0000 mg | ORAL_TABLET | Freq: Every day | ORAL | 0 refills | Status: DC

## 2017-02-10 MED ORDER — FLUTICASONE PROPIONATE 50 MCG/ACT NA SUSP: 2.0000 | Freq: Every day | NASAL | 0 refills | Status: DC

## 2017-02-10 MED ORDER — SALINE SPRAY 0.65 % NA SOLN
1.0000 | NASAL | 0 refills | Status: DC | PRN
Start: 2017-02-10 — End: 2018-01-12

## 2017-02-10 MED ORDER — LEVOFLOXACIN 250 MG PO TABS: 250.0000 mg | ORAL_TABLET | Freq: Every day | ORAL | 0 refills | Status: DC

## 2017-02-10 MED ORDER — DM-GUAIFENESIN ER 30-600 MG PO TB12: 1.0000 | ORAL_TABLET | Freq: Two times a day (BID) | ORAL | 0 refills | Status: DC | PRN

## 2017-02-10 NOTE — Patient Instructions (Signed)

## 2017-02-10 NOTE — Telephone Encounter (Signed)
Clarified Levofloxacin 250mg  one tablet daily for seven days. Conversation with [facility].

## 2017-02-10 NOTE — Progress Notes (Signed)
Subjective:  Patient ID: Holly Farley, female    DOB: 04/16/1980  Age: 36 y.o. MRN: [id number]  CC: Cough (coughing green mucus,chest congerstion,sinus,weak/ going on for 2 and half week/ went to urgent clinic finish abx but  not better. )   Sinusitis  This is a new problem. The current episode started 1 to 4 weeks ago. The problem has been waxing and waning since onset. There has been no fever. Associated symptoms include congestion, coughing, headaches, shortness of breath, sinus pressure and a sore throat. Pertinent negatives include no chills or swollen glands. Past treatments include acetaminophen, antibiotics, oral decongestants and spray decongestants (augmentin x 1wee). The treatment provided no relief.   contact with sick children and husband  Outpatient Medications Prior to Visit  Medication Sig Dispense Refill  . [name] Take 1 tablet by mouth 3 (three) times daily.     . Cyanocobalamin ([name]) 2500 MCG SUBL Place 2,500 mcg under the tongue daily.    . Multiple Vitamin (MULTIVITAMIN WITH MINERALS) TABS tablet Take 1 tablet by mouth daily.    . metroNIDAZOLE (METROGEL) 1 % gel Apply topically daily. (Patient not taking: Reported on 02/10/2017) 45 g 0   No facility-administered medications prior to visit.     ROS See HPI  Objective:  BP 98/64   Pulse 84   Temp 97.8 F (36.6 C)   Ht 5\' 2"  (1.575 m)   Wt 136 lb (61.7 kg)   SpO2 98%   BMI 24.87 kg/m   BP Readings from Last 3 Encounters:  02/10/17 98/64  01/27/16 114/62  07/09/15 104/62    Wt Readings from Last 3 Encounters:  02/10/17 136 lb (61.7 kg)  09/01/16 136 lb 6.4 oz (61.9 kg)  06/03/16 135 lb 9.6 oz (61.5 kg)    Physical Exam  Constitutional: She is oriented to person, place, and time.  HENT:  Right Ear: Tympanic membrane, external ear and ear canal normal.  Left Ear: Tympanic membrane, external ear and ear canal normal.  Nose: Rhinorrhea present. No  mucosal edema. Right sinus exhibits maxillary sinus tenderness. Right sinus exhibits no frontal sinus tenderness. Left sinus exhibits maxillary sinus tenderness. Left sinus exhibits no frontal sinus tenderness.  Mouth/Throat: Uvula is midline. No trismus in the jaw. Posterior oropharyngeal erythema present. No oropharyngeal exudate.  Eyes: No scleral icterus.  Neck: Normal range of motion. Neck supple.  Cardiovascular: Normal rate and normal heart sounds.  Pulmonary/Chest: Effort normal and breath sounds normal.  Musculoskeletal: She exhibits no edema.  Lymphadenopathy:    She has no cervical adenopathy.  Neurological: She is alert and oriented to person, place, and time.  Vitals reviewed.   Lab Results  Component Value Date   WBC 11.5 (H) 10/03/2014   HGB 13.6 10/03/2014   HCT 40.0 10/03/2014   PLT 343 10/03/2014   GLUCOSE 87 01/27/2016   CHOL 139 01/27/2016   TRIG 102.0 01/27/2016   HDL 60.20 01/27/2016   LDLDIRECT 101.9 05/26/2012   LDLCALC 58 01/27/2016   ALT 20 01/27/2016   AST 16 01/27/2016   NA 139 01/27/2016   K 3.7 01/27/2016   CL 102 01/27/2016   CREATININE 0.71 01/27/2016   BUN 9 01/27/2016   CO2 30 01/27/2016   TSH 0.90 01/27/2016   HGBA1C 5.6 01/27/2016    No results found.  Assessment & Plan:   Holly Farley was seen today for cough.  Diagnoses and all orders for this visit:  Acute  non-recurrent pansinusitis -     dextromethorphan-guaiFENesin (MUCINEX DM) 30-600 MG 12hr tablet; Take 1 tablet by mouth 2 (two) times daily as needed for cough. -     sodium chloride (OCEAN) 0.65 % SOLN nasal spray; Place 1 spray into both nostrils as needed for congestion. -     fluticasone (FLONASE) 50 MCG/ACT nasal spray; Place 2 sprays into both nostrils daily. -     Discontinue: levofloxacin (LEVAQUIN) 250 MG tablet; Take 2 tablets (500 mg total) by mouth daily. -     levofloxacin (LEVAQUIN) 250 MG tablet; Take 1 tablet (250 mg total) by mouth daily.   I have discontinued  [name]'s metroNIDAZOLE. I have also changed her levofloxacin. Additionally, I am having her start on dextromethorphan-guaiFENesin, sodium chloride, and fluticasone. Lastly, I am having her maintain her multivitamin with minerals, [name], and [name].  Meds ordered this encounter  Medications  . dextromethorphan-guaiFENesin (MUCINEX DM) 30-600 MG 12hr tablet    Sig: Take 1 tablet by mouth 2 (two) times daily as needed for cough.    Dispense:  14 tablet    Refill:  0    Order Specific Question:   Supervising Provider    Answer:   Dianne DunARON, TALIA M [[id number]]  . sodium chloride (OCEAN) 0.65 % SOLN nasal spray    Sig: Place 1 spray into both nostrils as needed for congestion.    Dispense:  15 mL    Refill:  0    Order Specific Question:   Supervising Provider    Answer:   Dianne DunARON, TALIA M [[id number]]  . fluticasone (FLONASE) 50 MCG/ACT nasal spray    Sig: Place 2 sprays into both nostrils daily.    Dispense:  16 g    Refill:  0    Order Specific Question:   Supervising Provider    Answer:   Dianne DunARON, TALIA M [[id number]]  . DISCONTD: levofloxacin (LEVAQUIN) 250 MG tablet    Sig: Take 2 tablets (500 mg total) by mouth daily.    Dispense:  7 tablet    Refill:  0    Order Specific Question:   Supervising Provider    Answer:   Dianne DunARON, TALIA M [[id number]]  . levofloxacin (LEVAQUIN) 250 MG tablet    Sig: Take 1 tablet (250 mg total) by mouth daily.    Dispense:  7 tablet    Refill:  0    Order Specific Question:   Supervising Provider    Answer:   Dianne DunARON, TALIA M [[id number]]    Follow-up: Return if symptoms worsen or fail to improve.  Alysia Pennaharlotte Nche, NP

## 2017-02-10 NOTE — Telephone Encounter (Signed)
Copied from CRM [id number]. Topic: Quick Communication - Rx Refill/Question >> Feb 10, 2017 11:57 AM Darletta MollLander, Lumin L wrote: Has the patient contacted their pharmacy? Yes.   (Agent: If no, request that the patient contact the pharmacy for the refill.) Preferred Pharmacy (with phone number or street name): [facility] [place] [address] Agent: Please be advised that RX refills may take up to 3 business days. We ask that you follow-up with your pharmacy. Clarify instructions for levofloxacin (LEVAQUIN) 250 MG tablet. Was sent to pharm today.

## 2017-07-01 ENCOUNTER — Encounter: Payer: Self-pay | Admitting: Family Medicine

## 2017-11-11 ENCOUNTER — Encounter: Payer: Self-pay | Admitting: Family Medicine

## 2017-11-11 ENCOUNTER — Ambulatory Visit: Payer: BC Managed Care – PPO | Admitting: Family Medicine

## 2017-11-11 VITALS — BP 102/80 | HR 86 | Temp 98.0°F | Ht 62.0 in | Wt 135.4 lb

## 2017-11-11 DIAGNOSIS — M5412 Radiculopathy, cervical region: Secondary | ICD-10-CM | POA: Insufficient documentation

## 2017-11-11 DIAGNOSIS — J0100 Acute maxillary sinusitis, unspecified: Secondary | ICD-10-CM

## 2017-11-11 MED ORDER — PREDNISONE 20 MG PO TABS: 40.0000 mg | ORAL_TABLET | Freq: Every day | ORAL | 0 refills | Status: AC

## 2017-11-11 MED ORDER — AMOXICILLIN 500 MG PO CAPS: 500.0000 mg | ORAL_CAPSULE | Freq: Three times a day (TID) | ORAL | 0 refills | Status: DC

## 2017-11-11 NOTE — Assessment & Plan Note (Signed)
Supportive care with OTC's, rest and hydration Rx for amoxicillin

## 2017-11-11 NOTE — Assessment & Plan Note (Signed)
Trial of prednisone Gentle stretching/heat at home Can refer to PT if not improving.

## 2017-11-11 NOTE — Progress Notes (Signed)
Holly Farley - 37 y.o. female MRN [id number]  Date of birth: 12/20/1980  Subjective Chief Complaint  Patient presents with  . Neck Pain    pinch nerve in neck , sinus pressure    HPI Holly Farley is a 37 y.o. female here today with complaint of sinus pain/pressure with drainage as well as neck pain.  -Sinus pain:  Started about 5-6 days ago.  Has been trying allergy medications, nasal sprays and oral decongestants without improvement.  Denies fever,chills, cough, ear pain, headache or shortness of breath.   -Neck pain:  Neck pain on R side with some radiation to R arm with tingling sensation.  Has had in the past and had injection for this which helped for a while.  She denies weakness into the arm.  Does get some intermittent headaches in the back of her neck.    ROS:  A comprehensive ROS was completed and negative except as noted per HPI  No Known Allergies  Past Medical History:  Diagnosis Date  . Diabetes mellitus without complication (HCC)   . Insulin resistance   . Migraines   . PCOS (polycystic ovarian syndrome)    with infertility, followed by [facility]    Past Surgical History:  Procedure Laterality Date  . CESAREAN SECTION    . ESOPHAGOGASTRODUODENOSCOPY    . GASTRIC ROUX-EN-Y N/A 09/24/2014   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;  Surgeon: Luretha MurphyMatthew Martin, MD;  Location: [facility];  Service: General;  Laterality: N/A;  . TUBAL LIGATION      Social History   Socioeconomic History  . Marital status: Married    Spouse name: Not on file  . Number of children: Not on file  . Years of education: Not on file  . Highest education level: Not on file  Occupational History  . 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 Financial resource strain: Not on file  . Food insecurity:    Worry: Not on file    Inability: Not on file  . Transportation needs:    Medical: Not on file    Non-medical:  Not on file  Tobacco Use  . Smoking status: Former Games developermoker  . Smokeless tobacco: Never Used  Substance and Sexual Activity  . Alcohol use: No  . Drug use: No  . Sexual activity: Not on file  Lifestyle  . Physical activity:    Days per week: Not on file    Minutes per session: Not on file  . Stress: Not on file  Relationships  . Social connections:    Talks on phone: Not on file    Gets together: Not on file    Attends religious service: Not on file    Active member of club or organization: Not on file    Attends meetings of clubs or organizations: Not on file    Relationship status: Not on file  Other Topics Concern  . Not on file  Social History Narrative  . Not on file    Family History  Problem Relation Age of Onset  . Bipolar disorder Mother   . Migraines Mother   . Bipolar disorder Brother   . Diabetes Other     Health Maintenance  Topic Date Due  . FOOT EXAM  04/24/1992  . OPHTHALMOLOGY EXAM  04/24/1992  . URINE MICROALBUMIN  04/24/1992  . HIV Screening  04/24/1997  . TETANUS/TDAP  12/17/2015  .  HEMOGLOBIN A1C  07/26/2016  . INFLUENZA VACCINE  09/30/2017  . PAP SMEAR  01/27/2019  . PNEUMOCOCCAL POLYSACCHARIDE VACCINE AGE 18-64 HIGH RISK  Completed    ----------------------------------------------------------------------------------------------------------------------------------------------------------------------------------------------------------------- Physical Exam BP 102/80 (BP Location: Left Arm, Patient Position: Sitting, Cuff Size: Normal)   Pulse 86   Temp 98 F (36.7 C) (Oral)   Ht 5\' 2"  (1.575 m)   Wt 135 lb 6.4 oz (61.4 kg)   SpO2 98%   BMI 24.76 kg/m   Physical Exam  Constitutional: She is oriented to person, place, and time. She appears well-nourished. No distress.  HENT:  Head: Normocephalic and atraumatic.  Mouth/Throat: Oropharynx is clear and moist.  Maxillary sinus tenderness L>R Turbinates edematous.   Eyes: No scleral  icterus.  Neck: Neck supple.  Cardiovascular: Normal rate, regular rhythm and normal heart sounds.  Pulmonary/Chest: Effort normal and breath sounds normal.  Neurological: She is alert and oriented to person, place, and time. No cranial nerve deficit. She exhibits normal muscle tone.  Skin: Skin is warm and dry.  Psychiatric: She has a normal mood and affect. Her behavior is normal.    ------------------------------------------------------------------------------------------------------------------------------------------------------------------------------------------------------------------- Assessment and Plan  Cervical radiculopathy Trial of prednisone Gentle stretching/heat at home Can refer to PT if not improving.   Acute non-recurrent maxillary sinusitis Supportive care with OTC's, rest and hydration Rx for amoxicillin

## 2017-11-11 NOTE — Patient Instructions (Signed)
Start amoxicillin for sinus infection I think the prednisone should help some with the neck pain/pinched nerve.  If not helping adding on physical therapy may be beneficial as well.

## 2018-01-10 ENCOUNTER — Encounter: Payer: BC Managed Care – PPO | Admitting: Family Medicine

## 2018-01-12 ENCOUNTER — Telehealth: Payer: Self-pay | Admitting: *Deleted

## 2018-01-12 NOTE — Telephone Encounter (Signed)
with patient updated her pharmacy, verified medications, Allergies and medical history.

## 2018-01-13 ENCOUNTER — Encounter: Payer: Self-pay | Admitting: Family Medicine

## 2018-01-13 ENCOUNTER — Ambulatory Visit: Payer: BC Managed Care – PPO | Admitting: Family Medicine

## 2018-01-13 ENCOUNTER — Other Ambulatory Visit: Payer: Self-pay | Admitting: Family Medicine

## 2018-01-13 VITALS — BP 110/60 | HR 78 | Temp 98.6°F | Ht 62.5 in | Wt 129.2 lb

## 2018-01-13 DIAGNOSIS — R110 Nausea: Secondary | ICD-10-CM

## 2018-01-13 DIAGNOSIS — Z8639 Personal history of other endocrine, nutritional and metabolic disease: Secondary | ICD-10-CM

## 2018-01-13 DIAGNOSIS — Z23 Encounter for immunization: Secondary | ICD-10-CM

## 2018-01-13 DIAGNOSIS — Z0001 Encounter for general adult medical examination with abnormal findings: Secondary | ICD-10-CM | POA: Insufficient documentation

## 2018-01-13 DIAGNOSIS — Z0000 Encounter for general adult medical examination without abnormal findings: Secondary | ICD-10-CM

## 2018-01-13 DIAGNOSIS — R634 Abnormal weight loss: Secondary | ICD-10-CM

## 2018-01-13 DIAGNOSIS — Z01419 Encounter for gynecological examination (general) (routine) without abnormal findings: Secondary | ICD-10-CM

## 2018-01-13 HISTORY — DX: Abnormal weight loss: R63.4

## 2018-01-13 HISTORY — DX: Personal history of other endocrine, nutritional and metabolic disease: Z86.39

## 2018-01-13 LAB — CBC WITH DIFFERENTIAL/PLATELET
BASOS ABS: 0 10*3/uL (ref 0.0–0.1)
Basophils Relative: 0.4 % (ref 0.0–3.0)
EOS PCT: 1.9 % (ref 0.0–5.0)
Eosinophils Absolute: 0.1 10*3/uL (ref 0.0–0.7)
HEMATOCRIT: 40.5 % (ref 36.0–46.0)
Hemoglobin: 13.9 g/dL (ref 12.0–15.0)
LYMPHS PCT: 21.5 % (ref 12.0–46.0)
Lymphs Abs: 1.4 10*3/uL (ref 0.7–4.0)
MCHC: 34.4 g/dL (ref 30.0–36.0)
MCV: 89.9 fl (ref 78.0–100.0)
MONOS PCT: 5.7 % (ref 3.0–12.0)
Monocytes Absolute: 0.4 10*3/uL (ref 0.1–1.0)
NEUTROS ABS: 4.5 10*3/uL (ref 1.4–7.7)
Neutrophils Relative %: 70.5 % (ref 43.0–77.0)
PLATELETS: 287 10*3/uL (ref 150.0–400.0)
RBC: 4.5 Mil/uL (ref 3.87–5.11)
RDW: 12.7 % (ref 11.5–15.5)
WBC: 6.3 10*3/uL (ref 4.0–10.5)

## 2018-01-13 LAB — COMPREHENSIVE METABOLIC PANEL
ALK PHOS: 41 U/L (ref 39–117)
ALT: 17 U/L (ref 0–35)
AST: 15 U/L (ref 0–37)
Albumin: 4.5 g/dL (ref 3.5–5.2)
BILIRUBIN TOTAL: 0.8 mg/dL (ref 0.2–1.2)
BUN: 11 mg/dL (ref 6–23)
CO2: 29 meq/L (ref 19–32)
Calcium: 9.5 mg/dL (ref 8.4–10.5)
Chloride: 105 mEq/L (ref 96–112)
Creatinine, Ser: 0.76 mg/dL (ref 0.40–1.20)
GFR: 91.67 mL/min (ref >60.00)
Glucose, Bld: 95 mg/dL (ref 70–99)
Potassium: 4.8 mEq/L (ref 3.5–5.1)
Sodium: 140 mEq/L (ref 135–145)
Total Protein: 6.9 g/dL (ref 6.0–8.3)

## 2018-01-13 LAB — H. PYLORI ANTIBODY, IGG: H PYLORI IGG: NEGATIVE

## 2018-01-13 LAB — LIPID PANEL
CHOL/HDL RATIO: 2
Cholesterol: 139 mg/dL (ref 0–200)
HDL: 64 mg/dL (ref >39.00)
LDL CALC: 63 mg/dL (ref 0–99)
NonHDL: 75.12
Triglycerides: 62 mg/dL (ref 0.0–149.0)
VLDL: 12.4 mg/dL (ref 0.0–40.0)

## 2018-01-13 LAB — URINALYSIS, ROUTINE W REFLEX MICROSCOPIC
BILIRUBIN URINE: NEGATIVE
Ketones, ur: NEGATIVE
Leukocytes, UA: NEGATIVE
NITRITE: POSITIVE — AB
PH: 7.5 (ref 5.0–8.0)
Specific Gravity, Urine: 1.015 (ref 1.000–1.030)
Total Protein, Urine: NEGATIVE
Urine Glucose: NEGATIVE
Urobilinogen, UA: 0.2 (ref 0.0–1.0)

## 2018-01-13 LAB — TSH: TSH: 0.97 u[IU]/mL (ref 0.35–4.50)

## 2018-01-13 MED ORDER — CEPHALEXIN 500 MG PO CAPS: 500.0000 mg | ORAL_CAPSULE | Freq: Two times a day (BID) | ORAL | 0 refills | Status: DC

## 2018-01-13 MED ORDER — ONDANSETRON 4 MG PO TBDP: 4.0000 mg | ORAL_TABLET | Freq: Three times a day (TID) | ORAL | 0 refills | Status: DC | PRN

## 2018-01-13 NOTE — Progress Notes (Signed)
Subjective:   Patient ID: Holly Farley, female    DOB: 08/30/1980, 37 y.o.   MRN: [id number]  Holly Farley is a pleasant 37 y.o. year old female who presents to clinic today with Annual Exam  on 01/13/2018  HPI:  Health Maintenance  Topic Date Due  . PAP SMEAR  01/27/2019  . TETANUS/TDAP  01/14/2028  . INFLUENZA VACCINE  Completed  . 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 In the last month, she has lost 6 pounds unintentionally.  She does admit to not eating as much lately.  She is under significant stress.  In laws sued them and tried get custody of her kids.  That has all been dropped but was very stressful. Has had some nausea, no vomiting.  No abdominal pain or blood in her stool. No diarrhea.  No constipation.  Wt Readings from Last 3 Encounters:  01/13/18 129 lb 3.2 oz (58.6 kg)  11/11/17 135 lb 6.4 oz (61.4 kg)  02/10/17 136 lb (61.7 kg)    Last pap smear done by me on 01/27/16- no h/o abnormal pap smears.  Headaches/neck pain-  [facility] for a migraine.  Note reviewed..  Cervical xray was done, she was diagnosed with cervicalgia and occipital headaches.  Cannot take NSAIDs due h/o gastric bypass.  Given IM Depo Medrol, rx given for skelaxin and advised moist heat and rest.  Current Outpatient Medications on File Prior to Visit  Medication Sig Dispense Refill  . [name] Take 1 tablet by mouth 3 (three) times daily.     . Cyanocobalamin ([name]) 2500 MCG SUBL Place 2,500 mcg under the tongue daily.    . fluticasone (FLONASE) 50 MCG/ACT nasal spray Place 2 sprays into both nostrils daily. 16 g 0  . Multiple Vitamin (MULTIVITAMIN WITH MINERALS) TABS tablet Take 1 tablet by mouth daily.     No current facility-administered medications on file prior to visit.     No Known Allergies  Past Medical History:  Diagnosis Date  . Diabetes mellitus without  complication (HCC)   . Insulin resistance   . Migraines   . PCOS (polycystic ovarian syndrome)    with infertility, followed by [facility]    Past Surgical History:  Procedure Laterality Date  . CESAREAN SECTION    . ESOPHAGOGASTRODUODENOSCOPY    . GASTRIC ROUX-EN-Y N/A 09/24/2014   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;  Surgeon: Luretha MurphyMatthew Martin, MD;  Location: [facility];  Service: General;  Laterality: N/A;  . TUBAL LIGATION      Family History  Problem Relation Age of Onset  . Bipolar disorder Mother   . Migraines Mother   . Bipolar disorder Brother   . Diabetes Other     Social History   Socioeconomic History  . Marital status: Married    Spouse name: Not on file  . Number of children: Not on file  . Years of education: Not on file  . Highest education level: Not on file  Occupational History  . 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 Financial resource strain: Not on file  . Food insecurity:    Worry: Not on file    Inability: Not on file  . Transportation needs:    Medical: Not on file    Non-medical: Not on file  Tobacco Use  . Smoking status: Former Games developer  . Smokeless tobacco: Never Used  Substance and Sexual Activity  . Alcohol use: No  . Drug use: No  . Sexual activity: Not on file  Lifestyle  . Physical activity:    Days per week: Not on file    Minutes per session: Not on file  . Stress: Not on file  Relationships  . Social connections:    Talks on phone: Not on file    Gets together: Not on file    Attends religious service: Not on file    Active member of club or organization: Not on file    Attends meetings of clubs or organizations: Not on file    Relationship status: Not on file  . Intimate partner violence:    Fear of current or ex partner: Not on file    Emotionally abused: Not on file    Physically abused: Not on file    Forced sexual activity: Not on file    Other Topics Concern  . Not on file  Social History Narrative  . Not on file   The PMH, PSH, Social History, Family History, Medications, and allergies have been reviewed in [facility], and have been updated if relevant.   Review of Systems  Constitutional: Positive for appetite change and fatigue.  HENT: Negative.   Eyes: Negative.   Respiratory: Negative.   Cardiovascular: Negative.   Gastrointestinal: Positive for nausea. Negative for abdominal distention, abdominal pain, anal bleeding, blood in stool, constipation, diarrhea, rectal pain and vomiting.  Endocrine: Negative.   Musculoskeletal: Positive for myalgias, neck pain and neck stiffness.  Skin: Negative.   Allergic/Immunologic: Negative.   Neurological: Positive for headaches. Negative for seizures, syncope, speech difficulty, weakness, light-headedness and numbness.  Hematological: Negative.   Psychiatric/Behavioral: Negative.   All other systems reviewed and are negative.      Objective:    BP 110/60 (BP Location: Left Arm, Patient Position: Sitting, Cuff Size: Normal)   Pulse 78   Temp 98.6 F (37 C) (Oral)   Ht 5' 2.5" (1.588 m)   Wt 129 lb 3.2 oz (58.6 kg)   LMP 01/10/2018 (Exact Date)   SpO2 97%   BMI 23.25 kg/m    Physical Exam   General:  Well-developed,well-nourished,in no acute distress; alert,appropriate and cooperative throughout examination Head:  normocephalic and atraumatic.   Eyes:  vision grossly intact, PERRL Ears:  R ear normal and L ear normal externally, TMs clear bilaterally Nose:  no external deformity.   Mouth:  good dentition.   Neck:  No deformities, masses, or tenderness noted. Breasts:  No mass, nodules, thickening, tenderness, bulging, retraction, inflamation, nipple discharge or skin changes noted.   Lungs:  Normal respiratory effort, chest expands symmetrically. Lungs are clear to auscultation, no crackles or wheezes. Heart:  Normal rate and regular rhythm. S1 and S2 normal  without gallop, murmur, click, rub or other extra sounds. Abdomen:  Bowel sounds positive,abdomen soft and non-tender without masses, organomegaly or hernias noted. Msk:  No deformity or scoliosis noted of thoracic or lumbar spine.   Extremities:  No clubbing, cyanosis, edema, or deformity noted with normal full range of motion of all joints.   Neurologic:  alert & oriented X3 and gait normal.   Skin:  Intact without suspicious lesions or rashes Cervical Nodes:  No lymphadenopathy noted Axillary Nodes:  No palpable lymphadenopathy Psych:  Cognition and judgment appear intact. Alert and cooperative with normal attention span and concentration. No  apparent delusions, illusions, hallucinations       Assessment & Plan:   Well woman exam  Need for Tdap vaccination - Plan: Tdap vaccine greater than or equal to 7yo IM  Well woman exam without gynecological exam  Nausea - Plan: H. pylori antibody, IgG  History of diet-controlled diabetes - Plan: CBC with Differential/Platelet, Comprehensive metabolic panel, Lipid panel, TSH  Weight loss, unintentional - Plan: Urinalysis, Routine w reflex microscopic No follow-ups on file.

## 2018-01-13 NOTE — Patient Instructions (Signed)
Great to see you. I will call you with your lab results from today and you can view them online.   

## 2018-01-13 NOTE — Assessment & Plan Note (Signed)
Reviewed preventive care protocols, scheduled due services, and updated immunizations Discussed nutrition, exercise, diet, and healthy lifestyle.  Tdap given today. 

## 2018-01-13 NOTE — Assessment & Plan Note (Signed)
I suspect stress is playing a big role in her lack of appetite, nausea and weight loss since she has been going to court for months now.  Finally that it's over, she plans on eating more, drinking some protein shakes. eRx sent for zofran. Will check H pylori and other labs today. Abdominal exam is benign and reassuring. The patient indicates understanding of these issues and agrees with the plan.

## 2018-03-03 ENCOUNTER — Encounter: Payer: Self-pay | Admitting: Family Medicine

## 2018-03-03 ENCOUNTER — Ambulatory Visit: Payer: BC Managed Care – PPO | Admitting: Family Medicine

## 2018-03-03 ENCOUNTER — Telehealth: Payer: Self-pay

## 2018-03-03 VITALS — BP 104/68 | HR 102 | Temp 98.5°F | Ht 62.5 in | Wt 136.0 lb

## 2018-03-03 DIAGNOSIS — R590 Localized enlarged lymph nodes: Secondary | ICD-10-CM | POA: Insufficient documentation

## 2018-03-03 DIAGNOSIS — M542 Cervicalgia: Secondary | ICD-10-CM | POA: Diagnosis not present

## 2018-03-03 DIAGNOSIS — M546 Pain in thoracic spine: Secondary | ICD-10-CM

## 2018-03-03 HISTORY — DX: Pain in thoracic spine: M54.6

## 2018-03-03 MED ORDER — DICLOFENAC SODIUM 1 % TD GEL: 2.0000 g | Freq: Four times a day (QID) | TRANSDERMAL | 11 refills | Status: DC

## 2018-03-03 NOTE — Telephone Encounter (Signed)
Okay to write letter as requested? 

## 2018-03-03 NOTE — Assessment & Plan Note (Signed)
Reports having pain in the neck that triggers her headaches.  Likely myofascial in nature. - trigger point injection  - counseled on supportive care and HEP  - if no improvement consider PT or imaging.

## 2018-03-03 NOTE — Patient Instructions (Signed)
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  Please see me back in in 3-4 weeks if no better.

## 2018-03-03 NOTE — Assessment & Plan Note (Signed)
Reports that she has had this for years.  Lab work in November was normal.  May be associated with sinus symptoms currently -Advised to follow-up in 4 weeks.  If still ongoing may need to be referred for biopsy

## 2018-03-03 NOTE — Assessment & Plan Note (Signed)
Having medial sided scapular pain. Possible to be related to posture. No winging of scapula  - trigger point injection of rhomboids  - voltaren 1%. H/o gastric bypass  - counseled on supportive care - if no improvement consider imaging or PT.

## 2018-03-03 NOTE — Telephone Encounter (Signed)
TA-Pt is asking for a letter to be written to [facility] about when she was Dx with DM/looks like she was Dx on 7.25.16 while at [facility] for bariatric surgery/diet controlled/plz advise/thx dmf

## 2018-03-03 NOTE — Progress Notes (Signed)
Holly Farley - 38 y.o. female MRN [id number]  Date of birth: May 03, 1980  SUBJECTIVE:  Including CC & ROS.  Chief Complaint  Patient presents with  . Mass    right side  of neck  . Rash    in scalp    Holly Farley is a 38 y.o. female that is presenting with neck pain and upper back pain as well as a solitary cervical lymphadenopathy.  The upper back and neck pain is been present for some time.  When these become severe it triggers a headache for her.  She is limited with medications due to history of gastric bypass.  She has tried prednisone and Biofreeze with limited improvement.  Her symptoms seem to be getting worse.  She feels it at the base of her skull with radiation down the back to her right hip.  Denies any inciting event or trauma.  Sharp and stabbing in nature.  She reports having a solitary cervical lymphadenopathy on the right.  She reports this is been ongoing for several years.  It is recently gotten larger.  She reports dealing with a sinus problems right now.  Denies any fevers or chills..  Independent review of lab work from 11/14 shows normal CBC.   Review of Systems  Constitutional: Negative for fever.  HENT: Positive for congestion and sinus pressure.   Respiratory: Negative for cough.   Cardiovascular: Negative for chest pain.  Gastrointestinal: Negative for abdominal pain.  Musculoskeletal: Positive for back pain and neck pain.  Skin: Negative for color change.  Neurological: Negative for weakness.  Hematological: Negative for adenopathy.  Psychiatric/Behavioral: Negative for agitation.    HISTORY: Past Medical, Surgical, Social, and Family History Reviewed & Updated per EMR.   Pertinent Historical Findings include:  Past Medical History:  Diagnosis Date  . Diabetes mellitus without complication (HCC)   . Insulin resistance   . Migraines   . PCOS (polycystic ovarian syndrome)    with infertility, followed by [facility]    Past Surgical History:    Procedure Laterality Date  . CESAREAN SECTION    . ESOPHAGOGASTRODUODENOSCOPY    . GASTRIC ROUX-EN-Y N/A 09/24/2014   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;  Surgeon: Luretha Murphy, MD;  Location: [facility];  Service: General;  Laterality: N/A;  . TUBAL LIGATION      No Known Allergies  Family History  Problem Relation Age of Onset  . Bipolar disorder Mother   . Migraines Mother   . Bipolar disorder Brother   . Diabetes Other      Social History   Socioeconomic History  . Marital status: Married    Spouse name: Not on file  . Number of children: Not on file  . Years of education: Not on file  . Highest education level: Not on file  Occupational History  . Occupation: Office manager, going back to school    Employer: [employer]  Social Needs  . Financial resource strain: Not on file  . Food insecurity:    Worry: Not on file    Inability: Not on file  . Transportation needs:    Medical: Not on file    Non-medical: Not on file  Tobacco Use  . Smoking status: Former Games developer  . Smokeless tobacco: Never Used  Substance and Sexual Activity  . Alcohol use: No  . Drug use: No  . Sexual activity: Not on file  Lifestyle  . Physical activity:    Days per  week: Not on file    Minutes per session: Not on file  . Stress: Not on file  Relationships  . Social connections:    Talks on phone: Not on file    Gets together: Not on file    Attends religious service: Not on file    Active member of club or organization: Not on file    Attends meetings of clubs or organizations: Not on file    Relationship status: Not on file  . Intimate partner violence:    Fear of current or ex partner: Not on file    Emotionally abused: Not on file    Physically abused: Not on file    Forced sexual activity: Not on file  Other Topics Concern  . Not on file  Social History Narrative  . Not on file     PHYSICAL EXAM:  VS: BP 104/68   Pulse (!) 102    Temp 98.5 F (36.9 C) (Oral)   Ht 5' 2.5" (1.588 m)   Wt 136 lb (61.7 kg)   SpO2 98%   BMI 24.48 kg/m  Physical Exam Gen: NAD, alert, cooperative with exam, well-appearing ENT: normal lips, normal nasal mucosa, mobile cervical lymph node on the right with no overlying redness or warmth Eye: normal EOM, normal conjunctiva and lids CV:  no edema, +2 pedal pulses   Resp: no accessory muscle use, non-labored,  Skin: no rashes, no areas of induration  Neuro: normal tone, normal sensation to touch Psych:  normal insight, alert and oriented MSK:  Neck: Tenderness palpation at the base of the skull and along the paraspinal muscles on the left than right. Normal range of motion. Tenderness palpation the trapezius. Normal strength resistance with shrug. Normal shoulder range of motion Neurovascular intact   Aspiration/Injection Procedure Note Holly Farley [birth year]  Procedure: Injection Indications: Neck pain and upper back pain  Procedure Details Consent: Risks of procedure as well as the alternatives and risks of each were explained to the (patient/caregiver).  Consent for procedure obtained. Time Out: Verified patient identification, verified procedure, site/side was marked, verified correct patient position, special equipment/implants available, medications/allergies/relevent history reviewed, required imaging and test results available.  Performed.  The area was cleaned with iodine and alcohol swabs.    The left and right paraspinal neck muscles and left and right rhomboids was injected using 1 cc's of 40 mg Depo-Medrol and 4 cc's of 0.25% bupivacaine with a 25 1" needle.     A sterile dressing was applied.  Patient did tolerate procedure well.    ASSESSMENT & PLAN:   Cervical lymphadenopathy Reports that she has had this for years.  Lab work in November was normal.  May be associated with sinus symptoms currently -Advised to follow-up in 4 weeks.  If still ongoing may  need to be referred for biopsy  Neck pain Reports having pain in the neck that triggers her headaches.  Likely myofascial in nature. - trigger point injection  - counseled on supportive care and HEP  - if no improvement consider PT or imaging.   Acute bilateral thoracic back pain Having medial sided scapular pain. Possible to be related to posture. No winging of scapula  - trigger point injection of rhomboids  - voltaren 1%. H/o gastric bypass  - counseled on supportive care - if no improvement consider imaging or PT.

## 2018-03-03 NOTE — Telephone Encounter (Signed)
Copied from CRM [id number]. Topic: General - Other >> Mar 03, 2018  8:28 AM Gerrianne Scale wrote: Reason for CRM: pt calling about writing a letter to [facility] about when she was DX with DM for her job

## 2018-03-04 NOTE — Telephone Encounter (Signed)
Note created per TA/printed and will await TA to sign on 1.5.20/thx dmf

## 2018-03-08 ENCOUNTER — Telehealth: Payer: Self-pay

## 2018-03-08 NOTE — Telephone Encounter (Signed)
PA initiated and approved for Diclofenac Sodium 1%  KEY: [id number]

## 2018-03-22 ENCOUNTER — Ambulatory Visit: Payer: BC Managed Care – PPO | Admitting: Family Medicine

## 2018-03-22 ENCOUNTER — Encounter: Payer: Self-pay | Admitting: Family Medicine

## 2018-03-22 VITALS — BP 128/70 | HR 77 | Temp 97.9°F | Ht 62.5 in

## 2018-03-22 DIAGNOSIS — R590 Localized enlarged lymph nodes: Secondary | ICD-10-CM

## 2018-03-22 DIAGNOSIS — L409 Psoriasis, unspecified: Secondary | ICD-10-CM | POA: Diagnosis not present

## 2018-03-22 DIAGNOSIS — H6983 Other specified disorders of Eustachian tube, bilateral: Secondary | ICD-10-CM | POA: Diagnosis not present

## 2018-03-22 LAB — CBC
HCT: 38.6 % (ref 36.0–46.0)
Hemoglobin: 13.4 g/dL (ref 12.0–15.0)
MCHC: 34.8 g/dL (ref 30.0–36.0)
MCV: 90 fl (ref 78.0–100.0)
Platelets: 272 10*3/uL (ref 150.0–400.0)
RBC: 4.29 Mil/uL (ref 3.87–5.11)
RDW: 12.7 % (ref 11.5–15.5)
WBC: 5.7 10*3/uL (ref 4.0–10.5)

## 2018-03-22 MED ORDER — CLOBETASOL PROPIONATE 0.05 % EX SHAM: MEDICATED_SHAMPOO | CUTANEOUS | 0 refills | Status: DC

## 2018-03-22 MED ORDER — PREDNISONE 20 MG PO TABS: 20.0000 mg | ORAL_TABLET | Freq: Two times a day (BID) | ORAL | 0 refills | Status: AC

## 2018-03-22 NOTE — Progress Notes (Signed)
Established Patient Office Visit  Subjective:  Patient ID: Holly Farley, female    DOB: 29-Dec-1980  Age: 38 y.o. MRN: [id number]  CC:  Chief Complaint  Patient presents with  . Rash    HPI Holly Rhymesasha L Klepper presents for treatment and evaluation of stuffy ears, pruritic flaky rash in her scalp and concerned about a lymph node in her neck.  She has ongoing allergy rhinitis and treats this with Flonase and Allegra.  She has been using the Flovent and Flonase over the last few weeks.  She denies postnasal drip or rhinorrhea.  There is some facial pressure but no teeth pain.  There is been no fever or chills.  Her ears have been stopped up.  She has a past medical history of guttate psoriasis.  And asked for something for treatment of the psoriasis that is also in her scalp.  There is a lymph node of concern to her in her neck.  Past Medical History:  Diagnosis Date  . Diabetes mellitus without complication (HCC)   . Insulin resistance   . Migraines   . PCOS (polycystic ovarian syndrome)    with infertility, followed by [facility]    Past Surgical History:  Procedure Laterality Date  . CESAREAN SECTION    . ESOPHAGOGASTRODUODENOSCOPY    . GASTRIC ROUX-EN-Y N/A 09/24/2014   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;  Surgeon: Luretha MurphyMatthew Martin, MD;  Location: [facility];  Service: General;  Laterality: N/A;  . TUBAL LIGATION      Family History  Problem Relation Age of Onset  . Bipolar disorder Mother   . Migraines Mother   . Bipolar disorder Brother   . Diabetes Other     Social History   Socioeconomic History  . Marital status: Married    Spouse name: Not on file  . Number of children: Not on file  . Years of education: Not on file  . Highest education level: Not on file  Occupational History  . 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 Financial resource strain: Not on file  . Food insecurity:    Worry:  Not on file    Inability: Not on file  . Transportation needs:    Medical: Not on file    Non-medical: Not on file  Tobacco Use  . Smoking status: Former Games developermoker  . Smokeless tobacco: Never Used  Substance and Sexual Activity  . Alcohol use: No  . Drug use: No  . Sexual activity: Not on file  Lifestyle  . Physical activity:    Days per week: Not on file    Minutes per session: Not on file  . Stress: Not on file  Relationships  . Social connections:    Talks on phone: Not on file    Gets together: Not on file    Attends religious service: Not on file    Active member of club or organization: Not on file    Attends meetings of clubs or organizations: Not on file    Relationship status: Not on file  . Intimate partner violence:    Fear of current or ex partner: Not on file    Emotionally abused: Not on file    Physically abused: Not on file    Forced sexual activity: Not on file  Other Topics Concern  . Not on file  Social History Narrative  . Not on file  Outpatient Medications Prior to Visit  Medication Sig Dispense Refill  . [name] Take 1 tablet by mouth 3 (three) times daily.     . Cyanocobalamin ([name]) 2500 MCG SUBL Place 2,500 mcg under the tongue daily.    . diclofenac sodium (VOLTAREN) 1 % GEL Apply 2 g topically 4 (four) times daily. To affected joint. 100 g 11  . fluticasone (FLONASE) 50 MCG/ACT nasal spray Place 2 sprays into both nostrils daily. 16 g 0  . Multiple Vitamin (MULTIVITAMIN WITH MINERALS) TABS tablet Take 1 tablet by mouth daily.    . ondansetron (ZOFRAN ODT) 4 MG disintegrating tablet Take 1 tablet (4 mg total) by mouth every 8 (eight) hours as needed for nausea or vomiting. 30 tablet 0  . cephALEXin (KEFLEX) 500 MG capsule Take 1 capsule (500 mg total) by mouth 2 (two) times daily. (Patient not taking: Reported on 03/03/2018) 14 capsule 0   No facility-administered medications prior to visit.     No  Known Allergies  ROS Review of Systems  Constitutional: Negative for chills, diaphoresis, fatigue, fever and unexpected weight change.  HENT: Positive for congestion and hearing loss. Negative for ear discharge, ear pain, postnasal drip and rhinorrhea.   Eyes: Negative for photophobia and visual disturbance.  Respiratory: Negative.   Cardiovascular: Negative.   Gastrointestinal: Negative.   Endocrine: Negative for polyphagia and polyuria.  Genitourinary: Negative.   Musculoskeletal: Negative for arthralgias and myalgias.  Skin: Positive for rash.  Allergic/Immunologic: Negative for immunocompromised state.  Neurological: Negative for headaches.  Hematological: Does not bruise/bleed easily.  Psychiatric/Behavioral: Negative.       Objective:    Physical Exam  Constitutional: She is oriented to person, place, and time. She appears well-developed and well-nourished. No distress.  HENT:  Head: Normocephalic and atraumatic.  Right Ear: External ear normal. Tympanic membrane is retracted. Tympanic membrane is not injected, not scarred, not perforated and not erythematous. No middle ear effusion.  Left Ear: External ear normal. Tympanic membrane is retracted. Tympanic membrane is not injected, not scarred, not perforated and not erythematous.  No middle ear effusion.  Nose: Nose normal.  Mouth/Throat: Oropharynx is clear and moist.  Eyes: Pupils are equal, round, and reactive to light. Conjunctivae are normal. Right eye exhibits no discharge. Left eye exhibits no discharge. No scleral icterus.  Neck: Neck supple. No JVD present. No tracheal deviation present. No thyromegaly present.  Cardiovascular: Normal rate, regular rhythm and normal heart sounds.  Pulmonary/Chest: Effort normal and breath sounds normal. No stridor.  Lymphadenopathy:    She has cervical adenopathy.       Right cervical: Posterior cervical (77mm mass cw lymphoid tissue. ) adenopathy present.  Neurological: She is  alert and oriented to person, place, and time.  Skin: Skin is warm and dry. She is not diaphoretic.     Psychiatric: She has a normal mood and affect. Her behavior is normal.    BP 128/70   Pulse 77   Temp 97.9 F (36.6 C) (Oral)   Ht 5' 2.5" (1.588 m)   SpO2 99%   BMI 24.48 kg/m  Wt Readings from Last 3 Encounters:  03/03/18 136 lb (61.7 kg)  01/13/18 129 lb 3.2 oz (58.6 kg)  11/11/17 135 lb 6.4 oz (61.4 kg)   BP Readings from Last 3 Encounters:  03/22/18 128/70  03/03/18 104/68  01/13/18 110/60   Guideline developer:  UpToDate (see UpToDate for funding source) Date Released: June 2014  There are no preventive care reminders to display for this patient.  There are no preventive care reminders to display for this patient.  Lab Results  Component Value Date   TSH 0.97 01/13/2018   Lab Results  Component Value Date   WBC 6.3 01/13/2018   HGB 13.9 01/13/2018   HCT 40.5 01/13/2018   MCV 89.9 01/13/2018   PLT 287.0 01/13/2018   Lab Results  Component Value Date   NA 140 01/13/2018   K 4.8 01/13/2018   CO2 29 01/13/2018   GLUCOSE 95 01/13/2018   BUN 11 01/13/2018   CREATININE 0.76 01/13/2018   BILITOT 0.8 01/13/2018   ALKPHOS 41 01/13/2018   AST 15 01/13/2018   ALT 17 01/13/2018   PROT 6.9 01/13/2018   ALBUMIN 4.5 01/13/2018   CALCIUM 9.5 01/13/2018   ANIONGAP 13 10/03/2014   GFR 91.67 01/13/2018   Lab Results  Component Value Date   CHOL 139 01/13/2018   Lab Results  Component Value Date   HDL 64.00 01/13/2018   Lab Results  Component Value Date   LDLCALC 63 01/13/2018   Lab Results  Component Value Date   TRIG 62.0 01/13/2018   Lab Results  Component Value Date   CHOLHDL 2 01/13/2018   Lab Results  Component Value Date   HGBA1C 5.6 01/27/2016      Assessment & Plan:   Problem List Items Addressed This Visit    None    Visit Diagnoses    ETD (Eustachian tube dysfunction), bilateral    -  Primary   Relevant Medications    predniSONE (DELTASONE) 20 MG tablet   Lymphadenopathy, posterior cervical       Relevant Orders   CBC   Psoriasis of scalp       Relevant Medications   Clobetasol Propionate 0.05 % shampoo      Meds ordered this encounter  Medications  . Clobetasol Propionate 0.05 % shampoo    Sig: Massage into scalp twice daily.    Dispense:  118 mL    Refill:  0  . predniSONE (DELTASONE) 20 MG tablet    Sig: Take 1 tablet (20 mg total) by mouth 2 (two) times daily with a meal for 7 days.    Dispense:  14 tablet    Refill:  0    Follow-up: No follow-ups on file.

## 2018-03-22 NOTE — Patient Instructions (Signed)
Psoriasis    Psoriasis is a long-term (chronic) condition of skin inflammation. It occurs because your immune system causes skin cells to form too quickly. As a result, too many skin cells grow and create raised, red patches (plaques) that look silvery on your skin. Plaques may appear anywhere on your body. They can be any size or shape.  Psoriasis can come and go. The condition varies from mild to very severe. It cannot be passed from one person to another (not contagious).  What are the causes?  The cause of psoriasis is not known, but certain factors can make the condition worse. These include:   Damage or trauma to the skin, such as cuts, scrapes, sunburn, and dryness.   Lack of sunlight.   Certain medicines.   Alcohol.   Tobacco use.   Stress.   Infections caused by bacteria or viruses.  What increases the risk?  This condition is more likely to develop in:   People with a family history of psoriasis.   People who are Caucasian.   People who are between the ages of 15-30 and 50-60 years old.  What are the signs or symptoms?  There are five different types of psoriasis. You can have more than one type of psoriasis during your life. Types are:   Plaque.   Guttate.   Inverse.   Pustular.   Erythrodermic.  Each type of psoriasis has different symptoms.   Plaque psoriasis symptoms include red, raised plaques with a silvery white coating (scale). These plaques may be itchy. Your nails may be pitted and crumbly or fall off.   Guttate psoriasis symptoms include small red spots that often show up on your trunk, arms, and legs. These spots may develop after you have been sick, especially with strep throat.   Inverse psoriasis symptoms include plaques in your underarm area, under your breasts, or on your genitals, groin, or buttocks.   Pustular psoriasis symptoms include pus-filled bumps that are painful, red, and swollen on the palms of your hands or the soles of your feet. You also may feel  exhausted, feverish, weak, or have no appetite.   Erythrodermic psoriasis symptoms include bright red skin that may look burned. You may have a fast heartbeat and a body temperature that is too high or too low. You may be itchy or in pain.  How is this diagnosed?  Your health care provider may suspect psoriasis based on your symptoms and family history. Your health care provider will also do a physical exam. This may include a procedure to remove a tissue sample (biopsy) for testing. You may also be referred to a health care provider who specializes in skin diseases (dermatologist).  How is this treated?  There is no cure for this condition, but treatment can help manage it. Goals of treatment include:   Helping your skin heal.   Reducing itching and inflammation.   Slowing the growth of new skin cells.   Helping your immune system respond better to your skin.  Treatment varies, depending on the severity of your condition. Treatment may include:   Creams or ointments.   Ultraviolet ray exposure (light therapy). This may include natural sunlight or light therapy in a medical office.   Medicines (systemic therapy). These medicines can help your body better manage skin cell turnover and inflammation. They may be used along with light therapy or ointments. You may also get antibiotic medicines if you have an infection.  Follow these instructions at home:    cigarettes, chewing tobacco, and e-cigarettes. If you need help quitting, ask your health care provider.  Drink little or no alcohol.  Try techniques for stress reduction, such as meditation or yoga.  Get exposure to the sun as told by your health care provider. Do not get sunburned.  Consider joining a  psoriasis support group. Medicines  Take or use over-the-counter and prescription medicines only as told by your health care provider.  If you were prescribed an antibiotic, take or use it as told by your health care provider. Do not stop taking the antibiotic even if your condition starts to improve. General instructions  Keep a journal to help track what triggers an outbreak. Try to avoid any triggers.  See a counselor or social worker if feelings of sadness, frustration, and hopelessness about your condition are interfering with your work and relationships.  Keep all follow-up visits as told by your health care provider. This is important. Contact a health care provider if:  Your pain gets worse.  You have increasing redness or warmth in the affected areas.  You have new or worsening pain or stiffness in your joints.  Your nails start to break easily or pull away from the nail bed.  You have a fever.  You feel depressed. This information is not intended to replace advice given to you by your health care provider. Make sure you discuss any questions you have with your health care provider. Document Released: 02/14/2000 Document Revised: 07/25/2015 Document Reviewed: 07/04/2014 Elsevier Interactive Patient Education  2019 Elsevier Inc.  Eustachian Tube Dysfunction  Eustachian tube dysfunction refers to a condition in which a blockage develops in the narrow passage that connects the middle ear to the back of the nose (eustachian tube). The eustachian tube regulates air pressure in the middle ear by letting air move between the ear and nose. It also helps to drain fluid from the middle ear space. Eustachian tube dysfunction can affect one or both ears. When the eustachian tube does not function properly, air pressure, fluid, or both can build up in the middle ear. What are the causes? This condition occurs when the eustachian tube becomes blocked or cannot open normally. Common  causes of this condition include:  Ear infections.  Colds and other infections that affect the nose, mouth, and throat (upper respiratory tract).  Allergies.  Irritation from cigarette smoke.  Irritation from stomach acid coming up into the esophagus (gastroesophageal reflux). The esophagus is the tube that carries food from the mouth to the stomach.  Sudden changes in air pressure, such as from descending in an airplane or scuba diving.  Abnormal growths in the nose or throat, such as: ? Growths that line the nose (nasal polyps). ? Abnormal growth of cells (tumors). ? Enlarged tissue at the back of the throat (adenoids). What increases the risk? You are more likely to develop this condition if:  You smoke.  You are overweight.  You are a child who has: ? Certain birth defects of the mouth, such as cleft palate. ? Large tonsils or adenoids. What are the signs or symptoms? Common symptoms of this condition include:  A feeling of fullness in the ear.  Ear pain.  Clicking or popping noises in the ear.  Ringing in the ear.  Hearing loss.  Loss of balance.  Dizziness. Symptoms may get worse when the air pressure around you changes, such as when you travel to an area of high elevation, fly on an airplane, or go scuba  diving. How is this diagnosed? This condition may be diagnosed based on:  Your symptoms.  A physical exam of your ears, nose, and throat.  Tests, such as those that measure: ? The movement of your eardrum (tympanogram). ? Your hearing (audiometry). How is this treated? Treatment depends on the cause and severity of your condition.  In mild cases, you may relieve your symptoms by moving air into your ears. This is called "popping the ears."  In more severe cases, or if you have symptoms of fluid in your ears, treatment may include: ? Medicines to relieve congestion (decongestants). ? Medicines that treat allergies (antihistamines). ? Nasal sprays  or ear drops that contain medicines that reduce swelling (steroids). ? A procedure to drain the fluid in your eardrum (myringotomy). In this procedure, a small tube is placed in the eardrum to:  Drain the fluid.  Restore the air in the middle ear space. ? A procedure to insert a balloon device through the nose to inflate the opening of the eustachian tube (balloon dilation). Follow these instructions at home: Lifestyle  Do not do any of the following until your health care provider approves: ? Travel to high altitudes. ? Fly in airplanes. ? Work in a Estate agent or room. ? Scuba dive.  Do not use any products that contain nicotine or tobacco, such as cigarettes and e-cigarettes. If you need help quitting, ask your health care provider.  Keep your ears dry. Wear fitted earplugs during showering and bathing. Dry your ears completely after. General instructions  Take over-the-counter and prescription medicines only as told by your health care provider.  Use techniques to help pop your ears as recommended by your health care provider. These may include: ? Chewing gum. ? Yawning. ? Frequent, forceful swallowing. ? Closing your mouth, holding your nose closed, and gently blowing as if you are trying to blow air out of your nose.  Keep all follow-up visits as told by your health care provider. This is important. Contact a health care provider if:  Your symptoms do not go away after treatment.  Your symptoms come back after treatment.  You are unable to pop your ears.  You have: ? A fever. ? Pain in your ear. ? Pain in your head or neck. ? Fluid draining from your ear.  Your hearing suddenly changes.  You become very dizzy.  You lose your balance. Summary  Eustachian tube dysfunction refers to a condition in which a blockage develops in the eustachian tube.  It can be caused by ear infections, allergies, inhaled irritants, or abnormal growths in the nose or  throat.  Symptoms include ear pain, hearing loss, or ringing in the ears.  Mild cases are treated with maneuvers to unblock the ears, such as yawning or ear popping.  Severe cases are treated with medicines. Surgery may also be done (rare). This information is not intended to replace advice given to you by your health care provider. Make sure you discuss any questions you have with your health care provider. Document Released: 03/15/2015 Document Revised: 06/08/2017 Document Reviewed: 06/08/2017 Elsevier Interactive Patient Education  2019 [employer].

## 2018-04-25 ENCOUNTER — Ambulatory Visit: Payer: Self-pay

## 2018-04-25 ENCOUNTER — Ambulatory Visit: Payer: BC Managed Care – PPO | Admitting: Nurse Practitioner

## 2018-04-25 ENCOUNTER — Encounter: Payer: Self-pay | Admitting: Nurse Practitioner

## 2018-04-25 VITALS — BP 90/74 | HR 84 | Temp 97.6°F | Ht 62.5 in | Wt 134.8 lb

## 2018-04-25 DIAGNOSIS — Z8249 Family history of ischemic heart disease and other diseases of the circulatory system: Secondary | ICD-10-CM | POA: Diagnosis not present

## 2018-04-25 DIAGNOSIS — R55 Syncope and collapse: Secondary | ICD-10-CM

## 2018-04-25 LAB — BASIC METABOLIC PANEL
BUN: 13 mg/dL (ref 6–23)
CO2: 31 mEq/L (ref 19–32)
Calcium: 9.6 mg/dL (ref 8.4–10.5)
Chloride: 102 mEq/L (ref 96–112)
Creatinine, Ser: 0.86 mg/dL (ref 0.40–1.20)
GFR: 74.66 mL/min (ref >60.00)
Glucose, Bld: 79 mg/dL (ref 70–99)
POTASSIUM: 4.8 meq/L (ref 3.5–5.1)
Sodium: 140 mEq/L (ref 135–145)

## 2018-04-25 LAB — MAGNESIUM: Magnesium: 1.9 mg/dL (ref 1.5–2.5)

## 2018-04-25 LAB — HEMOGLOBIN AND HEMATOCRIT, BLOOD
HCT: 42.2 % (ref 36.0–46.0)
Hemoglobin: 14.3 g/dL (ref 12.0–15.0)

## 2018-04-25 LAB — TSH: TSH: 1.03 u[IU]/mL (ref 0.35–4.50)

## 2018-04-25 NOTE — Progress Notes (Signed)
Subjective:  Patient ID: Holly Farley, female    DOB: Sep 23, 1980  Age: 38 y.o. MRN: [id number]  CC: Loss of Consciousness (pt is complaining of pass out this morning for about 20 sec,eyes roll backward. FYI--grandmother and cousin dx with HCM last year)  Loss of Consciousness  This is a new problem. The current episode started today. She lost consciousness for a period of less than 1 minute. The symptoms are aggravated by sitting up and normal activity. Associated symptoms include an aura. Pertinent negatives include no abdominal pain, auditory change, bladder incontinence, bowel incontinence, confusion, diaphoresis, dizziness, fever, headaches, light-headedness, malaise/fatigue, nausea, palpitations, slurred speech, vertigo, visual change, vomiting or weakness. Associated symptoms comments: Generalized Numbness and tingling and hot feeling prior to loosing consciousness..  S/p tubal ligation.  Accompanied by husband today.  Witnessed by husband, last about 20sec per husband Occurred while standing at kitchen counter this morning, no breakfast at this time.  hit back of head on floor. Husband denies jerking or postictal symptoms. Holly Farley states she does not remember incident. She also reports Previous syncopal episode while pregnant with son who is now 27yrs  FHx: Paternal grandmother (at age 2) and paternal cousin 46yrs old diagnosed with HCM  Reviewed past Medical, Social and Family history today.  Outpatient Medications Prior to Visit  Medication Sig Dispense Refill  . [name] Take 1 tablet by mouth 3 (three) times daily.     . Clobetasol Propionate 0.05 % shampoo Massage into scalp twice daily. 118 mL 0  . Cyanocobalamin ([name]) 2500 MCG SUBL Place 2,500 mcg under the tongue daily.    . diclofenac sodium (VOLTAREN) 1 % GEL Apply 2 g topically 4 (four) times daily. To affected joint. 100 g 11  . fluticasone (FLONASE) 50 MCG/ACT nasal  spray Place 2 sprays into both nostrils daily. 16 g 0  . Multiple Vitamin (MULTIVITAMIN WITH MINERALS) TABS tablet Take 1 tablet by mouth daily.    . ondansetron (ZOFRAN ODT) 4 MG disintegrating tablet Take 1 tablet (4 mg total) by mouth every 8 (eight) hours as needed for nausea or vomiting. 30 tablet 0   No facility-administered medications prior to visit.     ROS See HPI  Objective:  BP 90/74   Pulse 84   Temp 97.6 F (36.4 C) (Oral)   Ht 5' 2.5" (1.588 m)   Wt 134 lb 12.8 oz (61.1 kg)   SpO2 98%   BMI 24.26 kg/m   BP Readings from Last 3 Encounters:  04/25/18 90/74  03/22/18 128/70  03/03/18 104/68    Wt Readings from Last 3 Encounters:  04/25/18 134 lb 12.8 oz (61.1 kg)  03/03/18 136 lb (61.7 kg)  01/13/18 129 lb 3.2 oz (58.6 kg)    Physical Exam Vitals signs reviewed.  HENT:     Head:     Comments: No hematoma noted    Right Ear: Tympanic membrane, ear canal and external ear normal.     Left Ear: Tympanic membrane, ear canal and external ear normal.  Eyes:     Extraocular Movements: Extraocular movements intact.     Conjunctiva/sclera: Conjunctivae normal.  Neck:     Musculoskeletal: Normal range of motion and neck supple.  Cardiovascular:     Rate and Rhythm: Normal rate and regular rhythm.  Pulmonary:     Effort: Pulmonary effort is normal.     Breath sounds: Normal breath sounds.  Musculoskeletal: Normal range of motion.  Skin:    Findings: No erythema or rash.  Neurological:     Mental Status: She is alert and oriented to person, place, and time.     Cranial Nerves: No cranial nerve deficit.     Lab Results  Component Value Date   WBC 5.7 03/22/2018   HGB 14.3 04/25/2018   HCT 42.2 04/25/2018   PLT 272.0 03/22/2018   GLUCOSE 79 04/25/2018   CHOL 139 01/13/2018   TRIG 62.0 01/13/2018   HDL 64.00 01/13/2018   LDLDIRECT 101.9 05/26/2012   LDLCALC 63 01/13/2018   ALT 17 01/13/2018   AST 15 01/13/2018   NA 140 04/25/2018   K 4.8  04/25/2018   CL 102 04/25/2018   CREATININE 0.86 04/25/2018   BUN 13 04/25/2018   CO2 31 04/25/2018   TSH 1.03 04/25/2018   HGBA1C 5.6 01/27/2016   ECG: NSR, no change compared to previous ECG in 2016.  Assessment & Plan:   Holly Farley was seen today for loss of consciousness.  Diagnoses and all orders for this visit:  Syncope, unspecified syncope type -     EKG 12-Lead -     ECHOCARDIOGRAM COMPLETE; Future -     Ambulatory referral to Cardiology -     Hemoglobin and hematocrit, blood -     Basic metabolic panel -     Magnesium -     TSH -     Ambulatory referral to Neurology  Family history of hypertrophic cardiomyopathy -     ECHOCARDIOGRAM COMPLETE; Future   I am having Holly Farley maintain her multivitamin with minerals, [name], [name], fluticasone, ondansetron, diclofenac sodium, [name]  No orders of the defined types were placed in this encounter.   Problem List Items Addressed This Visit      Other   Family history of hypertrophic cardiomyopathy   Relevant Orders   ECHOCARDIOGRAM COMPLETE (Completed)    Other Visit Diagnoses    Syncope, unspecified syncope type    -  Primary   Relevant Orders   EKG 12-Lead (Completed)   ECHOCARDIOGRAM COMPLETE (Completed)   Ambulatory referral to Cardiology   Hemoglobin and hematocrit, blood (Completed)   Basic metabolic panel (Completed)   Magnesium (Completed)   TSH (Completed)   Ambulatory referral to Neurology       Follow-up: No follow-ups on file.  [name], NP

## 2018-04-25 NOTE — Telephone Encounter (Signed)
Pt. Reports she was standing in her kitchen this morning and fainted. Hit the back of her head on the floor. No injury to head - no bumps or lacerations. Husband witnessed the episode and said her eyes rolled back. No seizure activity noted. 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 Appointment made for this morning.  Reason for Disposition . Simple fainting is a chronic symptom (has occurred multiple times)  Answer Assessment - Initial Assessment Questions 1. ONSET: "How long were you unconscious?" (minutes) "When did it happen?"     A few seconds 2. CONTENT: "What happened during period of unconsciousness?" (e.g., seizure activity)      Eyes rolled back 3. MENTAL STATUS: "Alert and oriented now?" (oriented x 3 = name, month, location)      Alert and oriented 4. TRIGGER: "What do you think caused the fainting?" "What were you doing just before you fainted?"  (e.g., exercise, sudden standing up, prolonged standing)     Unsure 5. RECURRENT SYMPTOM: "Have you ever passed out before?" If so, ask: "When was the last time?" and "What happened that time?"      During pregnancy 6. INJURY: "Did you sustain any injury during the fall?"      No 7. CARDIAC SYMPTOMS: "Have you had any of the following symptoms: chest pain, difficulty breathing, palpitations?"     No 8. NEUROLOGIC SYMPTOMS: "Have you had any of the following symptoms: headache, numbness, vertigo, weakness?"     Has a headache now 9. GI SYMPTOMS: "Have you had any of the following symptoms: abdominal pain, vomiting, diarrhea, blood in stools?"     No 10. OTHER SYMPTOMS: "Do you have any other symptoms?"       No 11. PREGNANCY: "Is there any chance you are pregnant?" "When was your last menstrual period?"       Feb. 14  Protocols used: [facility]

## 2018-04-25 NOTE — Patient Instructions (Addendum)
ECG: normal sinus rhythm.  Normal lab results and echocardiogram  Advised not to drive till echocardiogram is completed.  You will be contacted to schedule appt with cardiology, and neurology .  Syncope  Syncope refers to a condition in which a person temporarily loses consciousness. Syncope may also be called fainting or passing out. It is caused by a sudden decrease in blood flow to the brain. Even though most causes of syncope are not dangerous, syncope can be a sign of a serious medical problem. Your health care provider may do tests to find the reason why you are having syncope. Signs that you may be about to faint include:  Feeling dizzy or light-headed.  Feeling nauseous.  Seeing all white or all black in your field of vision.  Having cold, clammy skin. If you faint, get medical help right away. Call your local emergency services (911 in the [place] Do not drive yourself to the hospital. Follow these instructions at home: Pay attention to any changes in your symptoms. Take these actions to stay safe and to help relieve your symptoms: Lifestyle  Do not drive, use machinery, or play sports until your health care provider says it is okay.  Do not drink alcohol.  Do not use any products that contain nicotine or tobacco, such as cigarettes and e-cigarettes. If you need help quitting, ask your health care provider.  Drink enough fluid to keep your urine pale yellow. General instructions  Take over-the-counter and prescription medicines only as told by your health care provider.  If you are taking blood pressure or heart medicine, get up slowly and take several minutes to sit and then stand. This can reduce dizziness or light-headedness.  Have someone stay with you until you feel stable.  If you start to feel like you might faint, lie down right away and raise (elevate) your feet above the level of your heart. Breathe deeply and steadily. Wait until all the symptoms have  passed.  Keep all follow-up visits as told by your health care provider. This is important. Get help right away if you:  Have a severe headache.  Faint once or repeatedly.  Have pain in your chest, abdomen, or back.  Have a very fast or irregular heartbeat (palpitations).  Have pain when you breathe.  Are bleeding from your mouth or rectum, or you have black or tarry stool.  Have a seizure.  Are confused.  Have trouble walking.  Have severe weakness.  Have vision problems. These symptoms may represent a serious problem that is an emergency. Do not wait to see if your symptoms will go away. Get medical help right away. Call your local emergency services (911 in the [place] Do not drive yourself to the hospital. Summary  Syncope refers to a condition in which a person temporarily loses consciousness. It is caused by a sudden decrease in blood flow to the brain.  Signs that you may be about to faint include dizziness, feeling light-headed, feeling nauseous, sudden vision changes, or cold, clammy skin.  Although most causes of syncope are not dangerous, syncope can be a sign of a serious medical problem. If you faint, get medical help right away. This information is not intended to replace advice given to you by your health care provider. Make sure you discuss any questions you have with your health care provider. Document Released: 02/16/2005 Document Revised: 01/25/2017 Document Reviewed: 01/25/2017 Elsevier Interactive Patient Education  2019 [employer].

## 2018-04-26 ENCOUNTER — Ambulatory Visit (HOSPITAL_COMMUNITY): Payer: BC Managed Care – PPO | Attending: Cardiovascular Disease

## 2018-04-26 DIAGNOSIS — Z8249 Family history of ischemic heart disease and other diseases of the circulatory system: Secondary | ICD-10-CM | POA: Insufficient documentation

## 2018-04-26 DIAGNOSIS — R55 Syncope and collapse: Secondary | ICD-10-CM | POA: Diagnosis present

## 2018-05-16 ENCOUNTER — Encounter: Payer: Self-pay | Admitting: Cardiology

## 2018-05-16 ENCOUNTER — Ambulatory Visit: Payer: BC Managed Care – PPO | Admitting: Cardiology

## 2018-05-16 ENCOUNTER — Other Ambulatory Visit: Payer: Self-pay

## 2018-05-16 VITALS — BP 110/62 | HR 70 | Ht 62.0 in | Wt 137.2 lb

## 2018-05-16 DIAGNOSIS — Z8249 Family history of ischemic heart disease and other diseases of the circulatory system: Secondary | ICD-10-CM

## 2018-05-16 DIAGNOSIS — R55 Syncope and collapse: Secondary | ICD-10-CM

## 2018-05-16 DIAGNOSIS — Z7189 Other specified counseling: Secondary | ICD-10-CM | POA: Diagnosis not present

## 2018-05-16 DIAGNOSIS — Z712 Person consulting for explanation of examination or test findings: Secondary | ICD-10-CM

## 2018-05-16 HISTORY — DX: Syncope and collapse: R55

## 2018-05-16 NOTE — Progress Notes (Signed)
Cardiology Office Note:    Date:  05/16/2018   ID:  Holly Farley, DOB 13-Mar-1980, MRN [id number]  PCP:  Dianne Dun, MD  Cardiologist:  Jodelle Red, MD PhD  Referring MD: Anne Ng, NP   CC: syncope  History of Present Illness:    Holly Farley is a 38 y.o. female with a hx of prior diabetes type II, resolved after gastric bypass surgery, who is seen as a new consult at the request of Nche, Bonna Gains, NP for the evaluation and management of syncope.  Patient concerns: Paternal Grandmother has HCM, paternal cousin has HCM. Father died at age 73 in car accident. She is concerned that this might be related to HCM given her family history.   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 Was standing in the kitchen talking to her husband. Felt very hot, felt head to toe numbness. Next she knew she was waking up on the floor. She remembers looking at him and trying to speak, but she couldn't. Denies confusion, no bladder or bowel incontience, no tongue biting. Only other episode of syncope was right after giving birth, when she was standing up from toilet.   No racing heartbeat, no tunnel vision, no shortness of breath, no diaphoresis prior to, during, or after event. Has a history of migraines, has had more mild versions of numbness and seeing spots in the past.   Did not drive after her episode until her echo returned normal.  Has a history of diabetes, had gastric bypass three years ago and normal since. Very active, plays softball and indoor soccer without issues.   Denies chest pain, shortness of breath at rest or with normal exertion. No PND, orthopnea, LE edema or unexpected weight gain. No syncope or palpitations.  Past Medical History:  Diagnosis Date  . Diabetes mellitus without complication (HCC)   . Insulin resistance   . Migraines   . PCOS (polycystic ovarian syndrome)    with infertility,  followed by [facility]    Past Surgical History:  Procedure Laterality Date  . CESAREAN SECTION    . ESOPHAGOGASTRODUODENOSCOPY    . GASTRIC ROUX-EN-Y N/A 09/24/2014   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;  Surgeon: Luretha Murphy, MD;  Location: [facility];  Service: General;  Laterality: N/A;  . TUBAL LIGATION      Current Medications: Current Outpatient Medications on File Prior to Visit  Medication Sig  . azelastine (ASTELIN) 0.1 % nasal spray Place into the nose.  . [name] Take 1 tablet by mouth 3 (three) times daily.   . cefdinir (OMNICEF) 300 MG capsule Take by mouth.  . chlorpheniramine-HYDROcodone (TUSSIONEX) 10-8 MG/5ML SUER Take by mouth.  . Clobetasol Propionate 0.05 % shampoo Massage into scalp twice daily.  . Cyanocobalamin ([name]) 2500 MCG SUBL Place 2,500 mcg under the tongue daily.  . diclofenac sodium (VOLTAREN) 1 % GEL Apply 2 g topically 4 (four) times daily. To affected joint.  . fluticasone (FLONASE) 50 MCG/ACT nasal spray Place 2 sprays into both nostrils daily.  . Multiple Vitamin (MULTIVITAMIN WITH MINERALS) TABS tablet Take 1 tablet by mouth daily.  . ondansetron (ZOFRAN ODT) 4 MG disintegrating tablet Take 1 tablet (4 mg total) by mouth every 8 (eight) hours as needed for nausea or vomiting.   No current facility-administered medications on file prior to visit.  Allergies:   Patient has no known allergies.   Social History   Socioeconomic History  . Marital status: Married    Spouse name: Not on file  . Number of children: Not on file  . Years of education: Not on file  . Highest education level: Not on file  Occupational History  . Occupation: Office manager, going back to school    Employer: [employer]  Social Needs  . Financial resource strain: Not on file  . Food insecurity:    Worry: Not on file    Inability: Not on file  . Transportation needs:    Medical: Not on file     Non-medical: Not on file  Tobacco Use  . Smoking status: Former Games developer  . Smokeless tobacco: Never Used  Substance and Sexual Activity  . Alcohol use: No  . Drug use: No  . Sexual activity: Yes    Birth control/protection: Surgical    Comment: tubal ligation  Lifestyle  . Physical activity:    Days per week: Not on file    Minutes per session: Not on file  . Stress: Not on file  Relationships  . Social connections:    Talks on phone: Not on file    Gets together: Not on file    Attends religious service: Not on file    Active member of club or organization: Not on file    Attends meetings of clubs or organizations: Not on file    Relationship status: Not on file  Other Topics Concern  . Not on file  Social History Narrative  . Not on file     Family History: The patient's family history includes Bipolar disorder in her brother and mother; Diabetes in an other family member; Hypertrophic cardiomyopathy (age of onset: 109) in her cousin; Hypertrophic cardiomyopathy (age of onset: 41) in her paternal grandmother; Migraines in her mother.  ROS:   Please see the history of present illness.  Additional pertinent ROS:  Constitutional: Negative for chills, fever, night sweats, unintentional weight loss  HENT: Negative for ear pain and hearing loss.   Eyes: Negative for loss of vision and eye pain.  Respiratory: Negative for cough, sputum, shortness of breath, wheezing.   Cardiovascular: See HPI. Gastrointestinal: Negative for abdominal pain, melena, and hematochezia.  Genitourinary: Negative for dysuria and hematuria.  Musculoskeletal: Negative for falls and myalgias.  Skin: Negative for itching and rash.  Neurological: Negative for focal weakness, focal sensory changes and loss of consciousness.  Endo/Heme/Allergies: Does not bruise/bleed easily.    EKGs/Labs/Other Studies Reviewed:    The following studies were reviewed today: Echo 04/26/18  1. The left ventricle has  normal systolic function with an ejection fraction of 60-65%. The cavity size was normal. Left ventricular diastolic parameters were normal.  2. The right ventricle has normal systolic function. The cavity was normal. There is no increase in right ventricular wall thickness.  3. The mitral valve is normal in structure.  4. The tricuspid valve is normal in structure.  5. The aortic valve is normal in structure.  6. The pulmonic valve was normal in structure.  7. The inferior vena cava was normal in size with <50% respiratory variability.  LV EF:          57.9 %   LVIDd:          4.60 cm  LVIDs:          3.20 cm LV PW:  0.60 cm  LV IVS:         0.70 cm    EKG:  EKG is personally reviewed.  The ekg ordered today demonstrates normal sinus rhythm  Recent Labs: 01/13/2018: ALT 17 03/22/2018: Platelets 272.0 04/25/2018: BUN 13; Creatinine, Ser 0.86; Hemoglobin 14.3; Magnesium 1.9; Potassium 4.8; Sodium 140; TSH 1.03  Recent Lipid Panel    Component Value Date/Time   CHOL 139 01/13/2018 0809   TRIG 62.0 01/13/2018 0809   HDL 64.00 01/13/2018 0809   CHOLHDL 2 01/13/2018 0809   VLDL 12.4 01/13/2018 0809   LDLCALC 63 01/13/2018 0809   LDLDIRECT 101.9 05/26/2012 0807    Physical Exam:    VS:  BP 110/62   Pulse 70   Ht  (1.575 m)   Wt 137 lb 3.2 oz (62.2 kg)   BMI 25.09 kg/m    Orthostatic VS for the past 24 hrs (Last 3 readings):  BP- Lying Pulse- Lying BP- Sitting Pulse- Sitting BP- Standing at 0 minutes Pulse- Standing at 0 minutes BP- Standing at 3 minutes Pulse- Standing at 3 minutes  05/16/18 0812 98/73 69 109/80 80 102/78 85 120/88 100    Wt Readings from Last 3 Encounters:  05/16/18 137 lb 3.2 oz (62.2 kg)  04/25/18 134 lb 12.8 oz (61.1 kg)  03/03/18 136 lb (61.7 kg)     GEN: Well nourished, well developed in no acute distress HEENT: Normal NECK: No JVD; No carotid bruits LYMPHATICS: No lymphadenopathy CARDIAC: regular rhythm, normal S1 and S2, no murmurs,  rubs, gallops. Radial and DP pulses 2+ bilaterally. RESPIRATORY:  Clear to auscultation without rales, wheezing or rhonchi  ABDOMEN: Soft, non-tender, non-distended MUSCULOSKELETAL:  No edema; No deformity  SKIN: Warm and dry NEUROLOGIC:  Alert and oriented x 3 PSYCHIATRIC:  Normal affect   ASSESSMENT:    1. Syncope, unspecified syncope type   2. Cardiac risk counseling   3. Encounter to discuss test results   4. Family history of hypertrophic cardiomyopathy    PLAN:    Syncope, with family history of hypertrophic cardiomyopathy -echo with normal wall thickness, no evidence of HCM -normal ECG -no symptoms of palpitations, chest pain before/during/after event -notes recent significant stress, including at the time of her syncope -discussed cardiac causes of syncope. No suggestion of arrhythmia, structural disease as cause. Low risk for ischemia as cause, and no suggestive symptoms -suspect this may be vasovagal/reflex syncope. Her blood pressure actually rises with standing, but she does elevate her heart rate. Not suggestive of orthostatic hypotension syndrome. Borderline for a dysautonomia syndrome. Without other symptoms and only one event, would not test further at this time. If she has a recurrent event, could consider monitor. -she is pending neuro eval for her head to toe numbness and unresponsiveness during her episode.  Cardiac risk counseling and prevention recommendations: -recommend heart healthy/Mediterranean diet, with whole grains, fruits, vegetable, fish, lean meats, nuts, and olive oil. Limit salt. -recommend moderate walking, 3-5 times/week for 30-50 minutes each session. Aim for at least 150 minutes.week. Goal should be pace of 3 miles/hours, or walking 1.5 miles in 30 minutes -recommend avoidance of tobacco products. Avoid excess alcohol. -Additional risk factor control:  -Diabetes: A1c is 5.6, diabetes resolved with gastric bypass surgery  -Lipids: Tchol 139, HDL  64, LDL 63, TG 62. Excellent.  -Blood pressure control:  Well controlled  -Weight: BMI 25 post gastric bypass surgery. -ASCVD risk score: The ASCVD Risk score Denman George DC Jr., et al., 2013) failed to  calculate for the following reasons:   The 2013 ASCVD risk score is only valid for ages 34 to 27    Plan for follow up: as needed  Medication Adjustments/Labs and Tests Ordered: Current medicines are reviewed at length with the patient today.  Concerns regarding medicines are outlined above.  Orders Placed This Encounter  Procedures  . EKG 12-Lead   No orders of the defined types were placed in this encounter.   Patient Instructions  Medication Instructions:  Your physician recommends that you continue on your current medications as directed. Please refer to the Current Medication list given to you today.  If you need a refill on your cardiac medications before your next appointment, please call your pharmacy.   Lab work: NONE If you have labs (blood work) drawn today and your tests are completely normal, you will receive your results only by: Marland Kitchen MyChart Message (if you have MyChart) OR . A paper copy in the mail If you have any lab test that is abnormal or we need to change your treatment, we will call you to review the results.  Testing/Procedures: NONE  Follow-Up: At [facility], you and your health needs are our priority.  As part of our continuing mission to provide you with exceptional heart care, we have created designated Provider Care Teams.  These Care Teams include your primary Cardiologist (physician) and Advanced Practice Providers (APPs -  Physician Assistants and Nurse Practitioners) who all work together to provide you with the care you need, when you need it. . You may schedule a follow up appointment AS NEEDED.  You may see Dr. Cristal Deer one of the following Advanced Practice Providers on your designated Care Team:   . Corine Shelter, [place] . Azalee Course, PA-C . Micah Flesher, PA-C . Joni Reining, DNP . Theodore Demark, PA-C . Judy Pimple, PA-C . Marjie Skiff, PA-C       Signed, Jodelle Red, MD PhD 05/16/2018 9:50 AM    [facility]

## 2018-05-16 NOTE — Patient Instructions (Signed)
Medication Instructions:  Your physician recommends that you continue on your current medications as directed. Please refer to the Current Medication list given to you today.  If you need a refill on your cardiac medications before your next appointment, please call your pharmacy.   Lab work: NONE If you have labs (blood work) drawn today and your tests are completely normal, you will receive your results only by: Marland Kitchen MyChart Message (if you have MyChart) OR . A paper copy in the mail If you have any lab test that is abnormal or we need to change your treatment, we will call you to review the results.  Testing/Procedures: NONE  Follow-Up: At [facility], you and your health needs are our priority.  As part of our continuing mission to provide you with exceptional heart care, we have created designated Provider Care Teams.  These Care Teams include your primary Cardiologist (physician) and Advanced Practice Providers (APPs -  Physician Assistants and Nurse Practitioners) who all work together to provide you with the care you need, when you need it. . You may schedule a follow up appointment AS NEEDED.  You may see Dr. Cristal Deer one of the following Advanced Practice Providers on your designated Care Team:   . Corine Shelter, [place] . Azalee Course, PA-C . Micah Flesher, PA-C . Joni Reining, DNP . Theodore Demark, PA-C . Judy Pimple, PA-C . Marjie Skiff, PA-C

## 2018-06-06 ENCOUNTER — Encounter: Payer: Self-pay | Admitting: *Deleted

## 2018-06-06 ENCOUNTER — Telehealth: Payer: Self-pay | Admitting: *Deleted

## 2018-06-06 NOTE — Telephone Encounter (Signed)
Called patient and updated her EMR prior to video visit. She had no questions,  verbalized understanding, appreciation.

## 2018-06-07 ENCOUNTER — Other Ambulatory Visit: Payer: Self-pay

## 2018-06-07 ENCOUNTER — Encounter: Payer: Self-pay | Admitting: Diagnostic Neuroimaging

## 2018-06-07 ENCOUNTER — Ambulatory Visit (INDEPENDENT_AMBULATORY_CARE_PROVIDER_SITE_OTHER): Payer: BC Managed Care – PPO | Admitting: Diagnostic Neuroimaging

## 2018-06-07 DIAGNOSIS — R55 Syncope and collapse: Secondary | ICD-10-CM | POA: Diagnosis not present

## 2018-06-07 NOTE — Progress Notes (Signed)
[facility]  PATIENT: Holly Farley DOB: 12-Aug-1980  REFERRING CLINICIAN: Nche HISTORY FROM: patient  REASON FOR VISIT: new consult (video)   HISTORICAL  CHIEF COMPLAINT:  Chief Complaint  Patient presents with  . Loss of Consciousness    HISTORY OF PRESENT ILLNESS:   38 year old female here for evaluation of syncope.  04/25/2018 patient was at home, in stressful situation, when all of a sudden she felt tingling all over, hot, sweaty sensation.  Patient fell to the ground and lost consciousness for 20 to 30 seconds.  She had some mild jittery movements in her body.  No tongue biting or incontinence.  Has been noted that her eyes were open and staring.  Patient then remembered her husband standing over her trying to wake her up.  Within 5 minutes she felt back to normal.  No prolonged postictal confusion or problems.  Since that time patient followed up with PCP and cardiology.  Patient was diagnosed with probable vasovagal syncope.  However as a precaution patient was referred for neurologic evaluation.  Patient had a similar but milder syncopal event postpartum approximately 8 years ago.  No prior seizures.  No family history of seizures.  Today patient feels well.  No problems with arms, legs, balance or walking.   REVIEW OF SYSTEMS: Full 14 system review of systems performed and negative with exception of: As per HPI.  ALLERGIES: No Known Allergies  HOME MEDICATIONS: Outpatient Medications Prior to Visit  Medication Sig Dispense Refill  . azelastine (ASTELIN) 0.1 % nasal spray Place into the nose.    . [name] Take 1 tablet by mouth 3 (three) times daily.     . chlorpheniramine-HYDROcodone (TUSSIONEX) 10-8 MG/5ML SUER Take by mouth.    . Clobetasol Propionate 0.05 % shampoo Massage into scalp twice daily. 118 mL 0  . Cyanocobalamin ([name]) 2500 MCG SUBL Place 2,500 mcg under the tongue daily.    .  diclofenac sodium (VOLTAREN) 1 % GEL Apply 2 g topically 4 (four) times daily. To affected joint. 100 g 11  . fluticasone (FLONASE) 50 MCG/ACT nasal spray Place 2 sprays into both nostrils daily. (Patient not taking: Reported on 06/06/2018) 16 g 0  . Multiple Vitamin (MULTIVITAMIN WITH MINERALS) TABS tablet Take 1 tablet by mouth daily.    . ondansetron (ZOFRAN ODT) 4 MG disintegrating tablet Take 1 tablet (4 mg total) by mouth every 8 (eight) hours as needed for nausea or vomiting. 30 tablet 0   No facility-administered medications prior to visit.     PAST MEDICAL HISTORY: Past Medical History:  Diagnosis Date  . Diabetes mellitus without complication (HCC)   . Insulin resistance   . Migraines   . PCOS (polycystic ovarian syndrome)    with infertility, followed by [facility]    PAST SURGICAL HISTORY: Past Surgical History:  Procedure Laterality Date  . CESAREAN SECTION    . ESOPHAGOGASTRODUODENOSCOPY    . GASTRIC ROUX-EN-Y N/A 09/24/2014   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;  Surgeon: Luretha MurphyMatthew Martin, MD;  Location: [facility];  Service: General;  Laterality: N/A;  . TUBAL LIGATION      FAMILY HISTORY: Family History  Problem Relation Age of Onset  . Bipolar disorder Mother   . Migraines Mother   . Bipolar disorder Brother   . Diabetes Other   . Hypertrophic cardiomyopathy Paternal Grandmother [id number]  . Hypertrophic cardiomyopathy Cousin 31    SOCIAL HISTORY: Social History  Socioeconomic History  . Marital status: Married    Spouse name: Not on file  . Number of children: 2  . Years of education: Not on file  . Highest education level: Bachelor's degree (e.g., BA, AB, BS)  Occupational History  . Not on file  Social Needs  . Financial resource strain: Not on file  . Food insecurity:    Worry: Not on file    Inability: Not on file  . Transportation needs:    Medical: Not on file    Non-medical: Not on file  Tobacco Use  . Smoking status:  Former Smoker    Last attempt to quit: 06/06/2007    Years since quitting: 11.0  . Smokeless tobacco: Never Used  Substance and Sexual Activity  . Alcohol use: No  . Drug use: No  . Sexual activity: Yes    Birth control/protection: Surgical    Comment: tubal ligation  Lifestyle  . Physical activity:    Days per week: Not on file    Minutes per session: Not on file  . Stress: Not on file  Relationships  . Social connections:    Talks on phone: Not on file    Gets together: Not on file    Attends religious service: Not on file    Active member of club or organization: Not on file    Attends meetings of clubs or organizations: Not on file    Relationship status: Not on file  . Intimate partner violence:    Fear of current or ex partner: Not on file    Emotionally abused: Not on file    Physically abused: Not on file    Forced sexual activity: Not on file  Other Topics Concern  . Not on file  Social History Narrative   Caffeine- 300 mg daily     PHYSICAL EXAM  GENERAL EXAM/CONSTITUTIONAL:  Vitals: There were no vitals filed for this visit.  There is no height or weight on file to calculate BMI. Wt Readings from Last 3 Encounters:  05/16/18 137 lb 3.2 oz (62.2 kg)  04/25/18 134 lb 12.8 oz (61.1 kg)  03/03/18 136 lb (61.7 kg)     Patient is in no distress; well developed, nourished and groomed; neck is supple  NEUROLOGIC: MENTAL STATUS:  No flowsheet data found.  awake, alert, oriented to person, place and time  recent and remote memory intact  normal attention and concentration  language fluent, comprehension intact, naming intact  fund of knowledge appropriate  CRANIAL NERVE:   2nd, 3rd, 4th, 6th - visual fields full to confrontation, extraocular muscles intact, no nystagmus  7th - facial strength symmetric  8th - hearing intact  11th - shoulder shrug symmetric  12th - tongue protrusion midline  MOTOR:   NO TREMOR; NO DRIFT IN BUE   COORDINATION:   fine finger movements normal  GAIT/STATION:   narrow based gait; romberg is negative     DIAGNOSTIC DATA (LABS, IMAGING, TESTING) - I reviewed patient records, labs, notes, testing and imaging myself where available.  Lab Results  Component Value Date   WBC 5.7 03/22/2018   HGB 14.3 04/25/2018   HCT 42.2 04/25/2018   MCV 90.0 03/22/2018   PLT 272.0 03/22/2018      Component Value Date/Time   NA 140 04/25/2018 0914   K 4.8 04/25/2018 0914   CL 102 04/25/2018 0914   CO2 31 04/25/2018 0914   GLUCOSE 79 04/25/2018 0914   BUN 13 04/25/2018 0914  CREATININE 0.86 04/25/2018 0914   CALCIUM 9.6 04/25/2018 0914   PROT 6.9 01/13/2018 0809   ALBUMIN 4.5 01/13/2018 0809   AST 15 01/13/2018 0809   ALT 17 01/13/2018 0809   ALKPHOS 41 01/13/2018 0809   BILITOT 0.8 01/13/2018 0809   GFRNONAA >60 10/03/2014 2227   GFRAA >60 10/03/2014 2227   Lab Results  Component Value Date   CHOL 139 01/13/2018   HDL 64.00 01/13/2018   LDLCALC 63 01/13/2018   LDLDIRECT 101.9 05/26/2012   TRIG 62.0 01/13/2018   CHOLHDL 2 01/13/2018   Lab Results  Component Value Date   HGBA1C 5.6 01/27/2016   No results found for: [id number] Lab Results  Component Value Date   TSH 1.03 04/25/2018    04/26/18 TTE  1. The left ventricle has normal systolic function with an ejection fraction of 60-65%. The cavity size was normal. Left ventricular diastolic parameters were normal.  2. The right ventricle has normal systolic function. The cavity was normal. There is no increase in right ventricular wall thickness.  3. The mitral valve is normal in structure.  4. The tricuspid valve is normal in structure.  5. The aortic valve is normal in structure.  6. The pulmonic valve was normal in structure.  7. The inferior vena cava was normal in size with <50% respiratory variability.   ASSESSMENT AND PLAN  38 y.o. year old female here with probable stress-induced vasovagal syncope.   Suspicion for epileptic seizure is very low.  Patient otherwise doing well.  Dx:  1. Syncope, unspecified syncope type     Virtual Visit via Video Note  I connected with Holly Farley on 06/07/18 at  4:00 PM EDT by a video enabled telemedicine application and verified that I am speaking with the correct person using two identifiers.   I discussed the limitations of evaluation and management by telemedicine and the availability of in person appointments. The patient expressed understanding and agreed to proceed.   I discussed the assessment and treatment plan with the patient. The patient was provided an opportunity to ask questions and all were answered. The patient agreed with the plan and demonstrated an understanding of the instructions.   The patient was advised to call back or seek an in-person evaluation if the symptoms worsen or if the condition fails to improve as anticipated.  I provided 25 minutes of non-face-to-face time during this encounter.   PLAN:  SYNCOPE (stress induced; ? vasovagal) - continue supportive care - monitor symptoms  Return for pending if symptoms worsen or fail to improve, return to PCP.    Suanne Marker, MD 06/07/2018, 3:57 PM Certified in Neurology, Neurophysiology and Neuroimaging  [facility] [address], [address] [place], [place] [address] [phone]

## 2018-06-28 ENCOUNTER — Telehealth: Payer: BC Managed Care – PPO | Admitting: Physician Assistant

## 2018-06-28 DIAGNOSIS — R519 Headache, unspecified: Secondary | ICD-10-CM

## 2018-06-28 DIAGNOSIS — J029 Acute pharyngitis, unspecified: Secondary | ICD-10-CM

## 2018-06-28 DIAGNOSIS — M791 Myalgia, unspecified site: Secondary | ICD-10-CM

## 2018-06-28 DIAGNOSIS — B349 Viral infection, unspecified: Secondary | ICD-10-CM

## 2018-06-28 DIAGNOSIS — R51 Headache: Secondary | ICD-10-CM

## 2018-06-28 NOTE — Progress Notes (Signed)
E-Visit for Corona Virus Screening  Based on your current symptoms, you may be suffering from COVID-19, however your symptoms are mild. Currently, not all patients are being tested. If the symptoms are mild and there is not a known exposure, performing the test is not indicated.  Coronavirus disease 2019 (COVID-19)is a respiratory illness that can spread from person to person. The virus that causes COVID-19 is a new virus that was first identified in the country of [place] but is now found in multiple other countries and has spread to the [place].  Symptoms associated with the virus are mild to severe fever, cough, and shortness of breath. There is currently no vaccine to protect against COVID-19, and there is no specific antiviral treatment for the virus.   To be considered HIGH RISK for Coronavirus (COVID-19), you have to meet the following criteria:  . Traveled to [place], [place], [place], [place] or [place]; or in the [place] to [place], [place], [place], or [place]; and have fever, cough, and shortness of breath within the last 2 weeks of travel OR  . Been in close contact with a person diagnosed with COVID-19 within the last 2 weeks and have fever, cough, and shortness of breath  . IF YOU DO NOT MEET THESE CRITERIA, YOU ARE CONSIDERED LOW RISK FOR COVID-19.   It is vitally important that if you feel that you have an infection such as this virus or any other virus that you stay home and away from places where you may spread it to others.  You should self-quarantine for 14 days if you have symptoms that could potentially be coronavirus and avoid contact with people age 36 and older.   You may also take acetaminophen (Tylenol) as needed for fever.   Reduce your risk of any infection by using the same precautions used for avoiding the common cold or flu: Wash your hands often with soap and warm water for at least 20 seconds.  If soap and water are not readily available, use an  alcohol-based hand sanitizer with at least 60% alcohol.  If coughing or sneezing, cover your mouth and nose by coughing or sneezing into the elbow areas of your shirt or coat, into a tissue or into your sleeve (not your hands). Avoid shaking hands with others and consider head nods or verbal greetings only.  Avoid touching your eyes,nose, or mouth with unwashed hands. Avoid close contact with people who are sick. Avoid places or events with large numbers of people in one location, like concerts or sporting events. Carefully consider travel plans you have or are making. If you are planning any travel outside or inside the [place], visit the CDC'sTravelers' Health webpagefor the latest health notices. If you have some symptoms but not all symptoms, continue to monitor at home and seek medical attention if your symptoms worsen. If you are having a medical emergency, call 911.  HOME CARE Only take medications as instructed by your medical team. Drink plenty of fluids and get plenty of rest. A steam or ultrasonic humidifier can help if you have congestion.   GET HELP RIGHT AWAY IF: You develop worsening fever. You become short of breath You cough up blood. Your symptoms become more severe MAKE SURE YOU  Understand these instructions. Will watch your condition. Will get help right away if you are not doing well or get worse.  Your e-visit answers were reviewed by a board certified advanced clinical practitioner to complete your personal care  plan.  Depending on the condition, your plan could have included both over the counter or prescription medications.  If there is a problem please reply once you have received a response from your provider. Your safety is important to [place].  If you have drug allergies check your prescription carefully.    You can use MyChart to ask questions about today's visit, request a non-urgent call back, or ask for a work or school excuse for 24 hours related to this  e-Visit. If it has been greater than 24 hours you will need to follow up with your provider, or enter a new e-Visit to address those concerns. You will get an e-mail in the next two days asking about your experience.  I hope that your e-visit has been valuable and will speed your recovery. Thank you for using e-visits.    ===View-only below this line===   ----- Message -----    From: [name]    Sent: 06/28/2018  1:59 PM EDT      To: E-Visit Mailing List Subject: E-Visit Submission: Flu Like Symptoms  E-Visit Submission: Flu Like Symptoms --------------------------------  Question: How long have you had flu like symptoms? Answer:   over two days  Question: Are you having any body aches or pain such as Answer:   Muscle pain            Back pain  Question: Do you have a cough or sore throat? Answer:   I have a sore throat  Question: Are you in close contact with anyone who has similar symptoms ? Answer:   No  Question: Do you have a fever? Answer:   I don't know  Question: Describe your sore throat: Answer:   The sore throat comes and goes. It does not hurt bad like with strep-throat or but it significant enough for me to notice a difference.  Question: How long have you had a sore throat? Answer:   2 days  Question: Do you have any tenderness or swelling in your neck? Answer:   Yes  Question: Are you short of breath? Answer:   No  Question: Do you have constant pain in your chest or abdomen? Answer:   Yes  Question: Are you able to keep liquids down? Answer:   Yes  Question: Have you experienced a seizure or loss of consciousness? Answer:   No  Question: Do you have a headache? Answer:   Yes  Question: Is there a rash? Answer:   Yes  Question: Are you over the age of 18? Answer:   No  Question: Are you treated for any of the following conditions: Asthma, COPD, diabetes, renal failure on dialysis, AIDS, any neuromuscular disease that effects the  clearing of secretions heart failure or heart disease? Answer:   Yes  Question: Have you received recent chemotherapy or are you taking a drug that may reduce your ability to fight infections for psoriasis, arthritis, or any other autoimmune condition? Answer:   Yes  Question: Do you have close contact with anyone who has been diagnosed with any of the conditions listed above? Answer:   No  Question: Are you pregnant? Answer:   I am confident that I am not pregnant  Question: Are you breastfeeding? Answer:   No  Question: Are your symptoms severe enough that you think or someone has told you that you need to see a physician urgently? Answer:   No  Question: Are you allergic to Zanamivir or Oseltamivir (Tamiflu)?  Answer:   No  Question: Are there children in your family or household that are under the age of 5? Answer:   No  Question: Please list your medication allergies that you may have ? 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  I have not ran a fever according to my home thermometer, but have periods of sweating as if I were or chills.  A total of 5-10 minutes was spent evaluating this patients questionnaire and formulating a plan of care.

## 2018-07-04 ENCOUNTER — Ambulatory Visit (INDEPENDENT_AMBULATORY_CARE_PROVIDER_SITE_OTHER): Payer: BC Managed Care – PPO | Admitting: Family Medicine

## 2018-07-04 ENCOUNTER — Encounter: Payer: Self-pay | Admitting: Family Medicine

## 2018-07-04 ENCOUNTER — Ambulatory Visit: Payer: BC Managed Care – PPO | Admitting: Diagnostic Neuroimaging

## 2018-07-04 DIAGNOSIS — R509 Fever, unspecified: Secondary | ICD-10-CM

## 2018-07-04 HISTORY — DX: Fever, unspecified: R50.9

## 2018-07-04 MED ORDER — ONDANSETRON 4 MG PO TBDP: 4.0000 mg | ORAL_TABLET | Freq: Three times a day (TID) | ORAL | 0 refills | Status: DC | PRN

## 2018-07-04 NOTE — Progress Notes (Signed)
Virtual Visit via Video   Due to the COVID-19 pandemic, this visit was completed with telemedicine (audio/video) technology to reduce patient and provider exposure as well as to preserve personal protective equipment.   I connected with [name] by a video enabled telemedicine application and verified that I am speaking with the correct person using two identifiers. Location patient: Home Location provider: [facility], Office Persons participating in the virtual visit: Alla German, MD   I discussed the limitations of evaluation and management by telemedicine and the availability of in person appointments. The patient expressed understanding and agreed to proceed.  Care Team   Patient Care Team: Dianne Dun, MD as PCP - General Jodelle Red, MD as PCP - Cardiology (Cardiology)  Subjective:   HPI:   Headache- has had had headache for 2 weeks.  It will ease of for 30 minutes to an hour. Body aches since 07/01/18 and fatigued.  Feels feverish but taking tylenol so she hasn't had a fever .  Denies cough.  She has had some intermittent nausea and vomiting.    She did remove a tick on the first day but no rash, no itch.  It was not very engorged and it was "barely hanging on."   Review of Systems  Constitutional: Positive for chills, fever and malaise/fatigue. Negative for diaphoresis and weight loss.  HENT: Negative.   Gastrointestinal: Negative.   Genitourinary: Negative.   Skin: Positive for rash.  Neurological: Positive for headaches. Negative for tingling, tremors, sensory change, speech change, focal weakness, seizures and weakness.  Endo/Heme/Allergies: Negative.   Psychiatric/Behavioral: Negative.   All other systems reviewed and are negative.    Patient Active Problem List   Diagnosis Date Noted  . Syncope 05/16/2018  . Family history of hypertrophic cardiomyopathy 04/25/2018  . Neck pain 03/03/2018  . Cervical lymphadenopathy  03/03/2018  . Acute bilateral thoracic back pain 03/03/2018  . Well woman exam without gynecological exam 01/13/2018  . Nausea 01/13/2018  . History of diet-controlled diabetes 01/13/2018  . Weight loss, unintentional 01/13/2018  . Rosacea 10/08/2014  . Gastric bypass status for obesity July 2016 09/26/2014  . Polycystic ovaries 12/17/2008    Social History   Tobacco Use  . Smoking status: Former Smoker    Last attempt to quit: 06/06/2007    Years since quitting: 11.0  . Smokeless tobacco: Never Used  Substance Use Topics  . Alcohol use: No    Current Outpatient Medications:  .  azelastine (ASTELIN) 0.1 % nasal spray, Place into the nose., Disp: , Rfl:  .  [name], Take 1 tablet by mouth 3 (three) times daily. , Disp: , Rfl:  .  Clobetasol Propionate 0.05 % shampoo, Massage into scalp twice daily., Disp: 118 mL, Rfl: 0 .  Cyanocobalamin ([name]) 2500 MCG SUBL, Place 2,500 mcg under the tongue daily., Disp: , Rfl:  .  diclofenac sodium (VOLTAREN) 1 % GEL, Apply 2 g topically 4 (four) times daily. To affected joint., Disp: 100 g, Rfl: 11 .  Multiple Vitamin (MULTIVITAMIN WITH MINERALS) TABS tablet, Take 1 tablet by mouth daily., Disp: , Rfl:  .  ondansetron (ZOFRAN ODT) 4 MG disintegrating tablet, Take 1 tablet (4 mg total) by mouth every 8 (eight) hours as needed for nausea or vomiting., Disp: 30 tablet, Rfl: 0 .  fluticasone (FLONASE) 50 MCG/ACT nasal spray, Place 2 sprays into both nostrils daily. (Patient not taking: Reported on 06/06/2018), Disp:  16 g, Rfl: 0  No Known Allergies  Objective:  Wt 135 lb 8 oz (61.5 kg)   BMI 24.78 kg/m   VITALS: Per patient if applicable, see vitals. GENERAL: Alert, appears well and in no acute distress. HEENT: Atraumatic, conjunctiva clear, no obvious abnormalities on inspection of external nose and ears. NECK: Normal movements of the head and neck. CARDIOPULMONARY: No increased WOB. Speaking in clear  sentences. I:E ratio WNL.  MS: Moves all visible extremities without noticeable abnormality. PSYCH: Pleasant and cooperative, well-groomed. Speech normal rate and rhythm. Affect is appropriate. Insight and judgement are appropriate. Attention is focused, linear, and appropriate.  NEURO: CN grossly intact. Oriented as arrived to appointment on time with no prompting. Moves both UE equally.  SKIN: No obvious lesions, wounds, erythema, or cyanosis noted on face or hands.  Depression screen [facility] 01/13/2018 03/07/2015 01/22/2015  Decreased Interest 0 0 0  Down, Depressed, Hopeless 0 0 0  PHQ - 2 Score 0 0 0    Assessment and Plan:   There are no diagnoses linked to this encounter.  Marland Kitchen. COVID-19 Education: The signs and symptoms of COVID-19 were discussed with the patient and how to seek care for testing if needed. The importance of social distancing was discussed today. . Reviewed expectations re: course of current medical issues. . Discussed self-management of symptoms. . Outlined signs and symptoms indicating need for more acute intervention. . Patient verbalized understanding and all questions were answered. Marland Kitchen. Health Maintenance issues including appropriate healthy diet, exercise, and smoking avoidance were discussed with patient. . See orders for this visit as documented in the electronic medical record.  Ruthe Mannanalia Alexx Mcburney, MD  Records requested if needed. Time spent: 25 minutes, of which >50% was spent in obtaining information about her symptoms, reviewing her previous labs, evaluations, and treatments, counseling her about her condition (please see the discussed topics above), and developing a plan to further investigate it; she had a number of questions which I addressed.

## 2018-07-04 NOTE — Assessment & Plan Note (Signed)
Symptoms do not seem consistent with tick borne illness and she would prefer to not come in for titers. ? Possible COVID.  Will send her a note through my chart for her employer and she will update me all week with how she is doing.  She is aware to go to the ED if symptoms worsen. >25 minutes spent in face to face time with patient, >50% spent in counselling or coordination of care

## 2018-07-28 ENCOUNTER — Telehealth (INDEPENDENT_AMBULATORY_CARE_PROVIDER_SITE_OTHER): Payer: BC Managed Care – PPO | Admitting: Family Medicine

## 2018-07-28 ENCOUNTER — Telehealth: Payer: Self-pay

## 2018-07-28 ENCOUNTER — Other Ambulatory Visit
Admission: RE | Admit: 2018-07-28 | Discharge: 2018-07-28 | Disposition: A | Discharge status: Home / Self Care | Payer: BC Managed Care – PPO | Source: Ambulatory Visit | Attending: Family Medicine | Admitting: Family Medicine

## 2018-07-28 ENCOUNTER — Encounter: Payer: Self-pay | Admitting: Family Medicine

## 2018-07-28 VITALS — Temp 97.9°F | Ht 61.5 in | Wt 135.0 lb

## 2018-07-28 DIAGNOSIS — Z79899 Other long term (current) drug therapy: Secondary | ICD-10-CM | POA: Diagnosis not present

## 2018-07-28 DIAGNOSIS — Z87891 Personal history of nicotine dependence: Secondary | ICD-10-CM | POA: Diagnosis not present

## 2018-07-28 DIAGNOSIS — E119 Type 2 diabetes mellitus without complications: Secondary | ICD-10-CM | POA: Diagnosis not present

## 2018-07-28 DIAGNOSIS — Z794 Long term (current) use of insulin: Secondary | ICD-10-CM | POA: Diagnosis not present

## 2018-07-28 DIAGNOSIS — Z833 Family history of diabetes mellitus: Secondary | ICD-10-CM | POA: Diagnosis not present

## 2018-07-28 DIAGNOSIS — S30861A Insect bite (nonvenomous) of abdominal wall, initial encounter: Secondary | ICD-10-CM

## 2018-07-28 DIAGNOSIS — R21 Rash and other nonspecific skin eruption: Secondary | ICD-10-CM | POA: Diagnosis not present

## 2018-07-28 DIAGNOSIS — W57XXXA Bitten or stung by nonvenomous insect and other nonvenomous arthropods, initial encounter: Secondary | ICD-10-CM

## 2018-07-28 MED ORDER — DOXYCYCLINE HYCLATE 100 MG PO TABS: 100.0000 mg | ORAL_TABLET | Freq: Two times a day (BID) | ORAL | 0 refills | Status: AC

## 2018-07-28 NOTE — Progress Notes (Signed)
Virtual Visit via Video Note  I connected with Holly Farley on 07/28/18 at  3:30 PM EDT by a video enabled telemedicine application and verified that I am speaking with the correct person using two identifiers. Location patient: work (school) Location provider: work  Persons participating in the virtual visit: patient, provider  I discussed the limitations of evaluation and management by telemedicine and the availability of in person appointments. The patient expressed understanding and agreed to proceed.  Chief Complaint  Patient presents with  . Tick Removal    was bitten again yesterday, removed,onset rash/multiple places     HPI: Holly Farley is a 38 y.o. female patient of Dr. Dayton MartesAron who states she was seen via video visit by PCP about 3 weeks ago for headache, fatigue, body/muscle aches and pt had removed a tick not long before symptoms started. Pt notes another tick bite yesterday. She removed the tick and she thinks it was a deer tick. She started with a rash 4-5 days ago on arms, abdomen, legs. Rash is flat, red/pink. She has some itching at both tick bite sites. No bullseye rash. Pt had 2 days of wrist pain last week that resolved on its own  She still feels fatigued, achy at times, and had intermittent headaches.  No fever, chills.    Past Medical History:  Diagnosis Date  . Diabetes mellitus without complication (HCC)   . Insulin resistance   . Migraines   . PCOS (polycystic ovarian syndrome)    with infertility, followed by [facility]    Past Surgical History:  Procedure Laterality Date  . CESAREAN SECTION    . ESOPHAGOGASTRODUODENOSCOPY    . GASTRIC ROUX-EN-Y N/A 09/24/2014   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;  Surgeon: Luretha MurphyMatthew Martin, MD;  Location: [facility];  Service: General;  Laterality: N/A;  . TUBAL LIGATION      Family History  Problem Relation Age of Onset  . Bipolar disorder Mother   . Migraines Mother   . Bipolar disorder  Brother   . Diabetes Other   . Hypertrophic cardiomyopathy Paternal Grandmother [phone]  . Hypertrophic cardiomyopathy Cousin [phone]    Social History   Tobacco Use  . Smoking status: Former Smoker    Last attempt to quit: 06/06/2007    Years since quitting: 11.1  . Smokeless tobacco: Never Used  Substance Use Topics  . Alcohol use: No  . Drug use: No     Current Outpatient Medications:  .  azelastine (ASTELIN) 0.1 % nasal spray, Place into the nose., Disp: , Rfl:  .  [name], Take 1 tablet by mouth 3 (three) times daily. , Disp: , Rfl:  .  Clobetasol Propionate 0.05 % shampoo, Massage into scalp twice daily., Disp: 118 mL, Rfl: 0 .  Cyanocobalamin ([name]) 2500 MCG SUBL, Place 2,500 mcg under the tongue daily., Disp: , Rfl:  .  diclofenac sodium (VOLTAREN) 1 % GEL, Apply 2 g topically 4 (four) times daily. To affected joint., Disp: 100 g, Rfl: 11 .  fluticasone (FLONASE) 50 MCG/ACT nasal spray, Place 2 sprays into both nostrils daily., Disp: 16 g, Rfl: 0 .  Multiple Vitamin (MULTIVITAMIN WITH MINERALS) TABS tablet, Take 1 tablet by mouth daily., Disp: , Rfl:  .  ondansetron (ZOFRAN ODT) 4 MG disintegrating tablet, Take 1 tablet (4 mg total) by mouth every 8 (eight) hours as needed for nausea or vomiting., Disp: 30 tablet, Rfl: 0  No Known  Allergies    ROS: See pertinent positives and negatives per HPI.   EXAM:  VITALS per patient if applicable: Temp 97.9 F (36.6 C) (Oral)   Ht 5' 1.5" (1.562 m)   Wt 135 lb (61.2 kg)   BMI 25.09 kg/m    GENERAL: alert, oriented, appears well and in no acute distress  NECK: normal movements of the head and neck  LUNGS: on inspection no signs of respiratory distress, breathing rate appears normal, no obvious gross SOB, gasping or wheezing, no conversational dyspnea  CV: no obvious cyanosis  MS: moves all visible extremities without noticeable abnormality  SKIN: very difficult to assess rash d/t poor  video quality/connection but appears to be hyperpigmented macules on upper abdomen, arms  PSYCH/NEURO: pleasant and cooperative, speech and thought processing grossly intact   ASSESSMENT AND PLAN:  1. Tick bite, initial encounter - B. burgdorfi antibodies - Rocky mtn spotted fvr abs pnl(IgG+IgM) - Ehrlichia antibody panel Rx: - doxycycline (VIBRA-TABS) 100 MG tablet; Take 1 tablet (100 mg total) by mouth 2 (two) times daily for 14 days.  Dispense: 28 tablet; Refill: 0 - f/u if symptoms worsen or do not improve in 2-3 wks  I discussed the assessment and treatment plan with the patient. The patient was provided an opportunity to ask questions and all were answered. The patient agreed with the plan and demonstrated an understanding of the instructions.   The patient was advised to call back or seek an in-person evaluation if the symptoms worsen or if the condition fails to improve as anticipated.   Luana Shu, DO

## 2018-07-28 NOTE — Telephone Encounter (Signed)
Pt called in today earlier to talk to someone about another tic bite from yesterday. No CRM/MyChart message found. Pt called second time, reporting muscle weakness,aches/pains- COVID19 similar Sx/Sy, but not COVID , No fever. Asked Dr. Corrie Dandy C to approve VV via MyChart. Pt was scheduled for 3:30 pm today.

## 2018-07-28 NOTE — Addendum Note (Signed)
Addended by: Overton Mam on: 07/28/2018 04:39 PM   Modules accepted: Level of Service

## 2018-07-29 LAB — MISC LABCORP TEST (SEND OUT): Labcorp test code: 164722

## 2018-07-29 LAB — B. BURGDORFI ANTIBODIES: B burgdorferi Ab IgG+IgM: <0.91 {ISR} (ref 0.00–0.90)

## 2018-08-01 LAB — ROCKY MTN SPOTTED FVR ABS PNL(IGG+IGM)
RMSF IgG: POSITIVE — AB
RMSF IgM: 0.34 index (ref 0.00–0.89)

## 2018-08-01 LAB — RMSF, IGG, IFA: RMSF, IGG, IFA: 1:128 {titer} — ABNORMAL HIGH

## 2018-08-03 ENCOUNTER — Encounter: Payer: Self-pay | Admitting: Family Medicine

## 2018-09-01 ENCOUNTER — Encounter: Payer: Self-pay | Admitting: Family Medicine

## 2018-09-01 ENCOUNTER — Telehealth (INDEPENDENT_AMBULATORY_CARE_PROVIDER_SITE_OTHER): Payer: BC Managed Care – PPO | Admitting: Family Medicine

## 2018-09-01 ENCOUNTER — Other Ambulatory Visit
Admission: RE | Admit: 2018-09-01 | Discharge: 2018-09-01 | Disposition: A | Discharge status: Home / Self Care | Payer: BC Managed Care – PPO | Source: Ambulatory Visit | Attending: Family Medicine | Admitting: Family Medicine

## 2018-09-01 VITALS — Temp 98.7°F

## 2018-09-01 DIAGNOSIS — W57XXXD Bitten or stung by nonvenomous insect and other nonvenomous arthropods, subsequent encounter: Secondary | ICD-10-CM

## 2018-09-01 DIAGNOSIS — A770 Spotted fever due to Rickettsia rickettsii: Secondary | ICD-10-CM | POA: Insufficient documentation

## 2018-09-01 DIAGNOSIS — R21 Rash and other nonspecific skin eruption: Secondary | ICD-10-CM

## 2018-09-01 DIAGNOSIS — R52 Pain, unspecified: Secondary | ICD-10-CM

## 2018-09-01 DIAGNOSIS — L409 Psoriasis, unspecified: Secondary | ICD-10-CM | POA: Insufficient documentation

## 2018-09-01 HISTORY — DX: Spotted fever due to Rickettsia rickettsii: A77.0

## 2018-09-01 LAB — COMPREHENSIVE METABOLIC PANEL
ALT: 20 U/L (ref 0–44)
AST: 21 U/L (ref 15–41)
Albumin: 4.2 g/dL (ref 3.5–5.0)
Alkaline Phosphatase: 44 U/L (ref 38–126)
Anion gap: 7 (ref 5–15)
BUN: 9 mg/dL (ref 6–20)
CO2: 25 mmol/L (ref 22–32)
Calcium: 8.9 mg/dL (ref 8.9–10.3)
Chloride: 107 mmol/L (ref 98–111)
Creatinine, Ser: 0.62 mg/dL (ref 0.44–1.00)
GFR calc Af Amer: >60 mL/min (ref >60)
GFR calc non Af Amer: >60 mL/min (ref >60)
Glucose, Bld: 89 mg/dL (ref 70–99)
Potassium: 3.8 mmol/L (ref 3.5–5.1)
Sodium: 139 mmol/L (ref 135–145)
Total Bilirubin: 0.7 mg/dL (ref 0.3–1.2)
Total Protein: 6.7 g/dL (ref 6.5–8.1)

## 2018-09-01 LAB — CBC WITH DIFFERENTIAL/PLATELET
Abs Immature Granulocytes: 0.02 10*3/uL (ref 0.00–0.07)
Basophils Absolute: 0 10*3/uL (ref 0.0–0.1)
Basophils Relative: 0 %
Eosinophils Absolute: 0.1 10*3/uL (ref 0.0–0.5)
Eosinophils Relative: 1 %
HCT: 35.5 % — ABNORMAL LOW (ref 36.0–46.0)
Hemoglobin: 12.1 g/dL (ref 12.0–15.0)
Immature Granulocytes: 0 %
Lymphocytes Relative: 24 %
Lymphs Abs: 2 10*3/uL (ref 0.7–4.0)
MCH: 29.9 pg (ref 26.0–34.0)
MCHC: 34.1 g/dL (ref 30.0–36.0)
MCV: 87.7 fL (ref 80.0–100.0)
Monocytes Absolute: 0.5 10*3/uL (ref 0.1–1.0)
Monocytes Relative: 7 %
Neutro Abs: 5.4 10*3/uL (ref 1.7–7.7)
Neutrophils Relative %: 68 %
Platelets: 233 10*3/uL (ref 150–400)
RBC: 4.05 MIL/uL (ref 3.87–5.11)
RDW: 11.6 % (ref 11.5–15.5)
WBC: 8 10*3/uL (ref 4.0–10.5)
nRBC: 0 % (ref 0.0–0.2)

## 2018-09-01 NOTE — Assessment & Plan Note (Addendum)
>  25 minutes spent in face to face time with patient, >50% spent in counselling or coordination of care.  Discussed with Holly Farley that I am quite concerned by the duration of her symptoms.  I have asked her to come in for repeat and additional lab work.  I also feel we should send her infectious disease as this can be a fatal disease.  We also discussed Chloramphenicol but I would like ID's opinion. The patient indicates understanding of these issues and agrees with the plan.  Orders Placed This Encounter  Procedures  . Comp Met (CMET)  . Ehrlichia Antibody Panel  . Rocky mtn spotted fvr abs pnl(IgG+IgM)  . B. burgdorfi antibodies  . CBC w/Diff

## 2018-09-01 NOTE — Progress Notes (Signed)
Virtual Visit via Video   Due to the COVID-19 pandemic, this visit was completed with telemedicine (audio/video) technology to reduce patient and provider exposure as well as to preserve personal protective equipment.   I connected with Robley Fries by a video enabled telemedicine application and verified that I am speaking with the correct person using two identifiers. Location patient: Home Location provider: [facility], Office Persons participating in the virtual visit: Lamar Laundry, MD   I discussed the limitations of evaluation and management by telemedicine and the availability of in person appointments. The patient expressed understanding and agreed to proceed.  Care Team   Patient Care Team: Lucille Passy, MD as PCP - General Buford Dresser, MD as PCP - Cardiology (Cardiology)  Subjective:   HPI:   Insect bite- found an embedded tick.    Saw Dr. Bryan Lemma on 07/28/18- note reviewed.  At that time she stated that  removed the tick and she thought  it was a deer tick. She started with a rash 4-5 days ago on arms, abdomen, legs. Rash is flat, red/pink. She has some itching at both tick bite sites. No bullseye rash. Pt had 2 days of wrist pain last week that resolved on its own  She still feels fatigued, achy at times, and had intermittent headaches.  No fever, chills.   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  She finished the course on 08/12/18. Neg for other tick borne illness at that time.  She is concerned because although she is less fatigued (never had a fever), she remains achy and new itchy spots keep popping up.  New lesions were appearing on her legs even while on doxycyline and they continue to "pop up."  She denies any signs or symptoms of meningitis- no neuropathy.   No bleeding.   Review of Systems  Constitutional: Negative for fever.  HENT: Negative.   Eyes: Negative.    Respiratory: Negative.   Cardiovascular: Negative.   Gastrointestinal: Negative.  Negative for abdominal pain.  Genitourinary: Negative.   Musculoskeletal: Positive for myalgias.  Skin: Positive for itching and rash.  Neurological: Negative.   Endo/Heme/Allergies: Negative.   Psychiatric/Behavioral: Negative.   All other systems reviewed and are negative.    Patient Active Problem List   Diagnosis Date Noted  . 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 Rash 09/01/2018  . Fever and chills 07/04/2018  . Syncope 05/16/2018  . Family history of hypertrophic cardiomyopathy 04/25/2018  . Cervical lymphadenopathy 03/03/2018  . Acute bilateral thoracic back pain 03/03/2018  . History of diet-controlled diabetes 01/13/2018  . Weight loss, unintentional 01/13/2018  . Rosacea 10/08/2014  . Gastric bypass status for obesity July 2016 09/26/2014  . Polycystic ovaries 12/17/2008    Social History   Tobacco Use  . Smoking status: Former Smoker    Quit date: 06/06/2007    Years since quitting: 11.2  . Smokeless tobacco: Never Used  Substance Use Topics  . Alcohol use: No    Current Outpatient Medications:  .  azelastine (ASTELIN) 0.1 % nasal spray, Place into the nose., Disp: , Rfl:  .  [name], Take 1 tablet by mouth 3 (three) times daily. , Disp: , Rfl:  .  Clobetasol Propionate 0.05 % shampoo, Massage into scalp twice daily., Disp: 118 mL, Rfl: 0 .  Cyanocobalamin ([name]) 2500 MCG SUBL, Place 2,500  mcg under the tongue daily., Disp: , Rfl:  .  diclofenac sodium (VOLTAREN) 1 % GEL, Apply 2 g topically 4 (four) times daily. To affected joint., Disp: 100 g, Rfl: 11 .  Multiple Vitamin (MULTIVITAMIN WITH MINERALS) TABS tablet, Take 1 tablet by mouth daily., Disp: , Rfl:  .  ondansetron (ZOFRAN ODT) 4 MG disintegrating tablet, Take 1 tablet (4 mg total) by mouth every 8 (eight) hours as needed for nausea or vomiting., Disp: 30 tablet, Rfl: 0   No Known Allergies  Objective:   VITALS: Per patient if applicable, see vitals. GENERAL: Alert, appears well and in no acute distress. HEENT: Atraumatic, conjunctiva clear, no obvious abnormalities on inspection of external nose and ears. NECK: Normal movements of the head and neck. CARDIOPULMONARY: No increased WOB. Speaking in clear sentences. I:E ratio WNL.  MS: Moves all visible extremities without noticeable abnormality. PSYCH: Pleasant and cooperative, well-groomed. Speech normal rate and rhythm. Affect is appropriate. Insight and judgement are appropriate. Attention is focused, linear, and appropriate.  NEURO: CN grossly intact. Oriented as arrived to appointment on time with no prompting. Moves both UE equally.  SKIN: Lesions on video seem macular/ ? Some petechia  Depression screen PHQ 2/9 01/13/2018 03/07/2015 01/22/2015  Decreased Interest 0 0 0  Down, Depressed, Hopeless 0 0 0  PHQ - 2 Score 0 0 0    Assessment and Plan:   Sabrine was seen today for insect bite.  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 burgdorfi antibodies; Future -     Cancel: Rocky mtn spotted fvr abs pnl(IgG+IgM); Future -     Cancel: Ehrlichia Antibody Panel; Future -     Cancel: Rocky mtn spotted fvr ab, IgG-blood; Future -     Ehrlichia Antibody Panel; Future -     Rocky mtn spotted fvr abs pnl(IgG+IgM); Future -     B. burgdorfi antibodies; Future -     Cancel: Rocky mtn spotted fvr ab, IgG-blood; Future -     CBC w/Diff; Future  Tick bite, subsequent encounter -     Cancel: CBC w/Diff; Future -     Comp Met (CMET); Future -     Cancel: B. burgdorfi antibodies; Future -     Cancel: Rocky mtn spotted fvr abs pnl(IgG+IgM); Future -     Cancel: Ehrlichia Antibody Panel; Future -     Cancel: Rocky mtn spotted fvr ab, IgG-blood; Future -     Ehrlichia Antibody Panel; Future  -     Rocky mtn spotted fvr abs pnl(IgG+IgM); Future -     B. burgdorfi antibodies; Future -     Cancel: Rocky mtn spotted fvr ab, IgG-blood; Future -     CBC w/Diff; Future  Body aches -     Cancel: CBC w/Diff; Future -     Comp Met (CMET); Future -     Cancel: B. burgdorfi antibodies; Future -     Cancel: Rocky mtn spotted fvr abs pnl(IgG+IgM); Future -     Cancel: Ehrlichia Antibody Panel; Future -     Cancel: Rocky mtn spotted fvr ab, IgG-blood; Future -     Ehrlichia Antibody Panel; Future -     Rocky mtn spotted fvr abs pnl(IgG+IgM); Future -     B. burgdorfi antibodies; Future -       Cancel: Rocky mtn spotted fvr ab, IgG-blood; Future -     CBC w/Diff; Future  Rash    . COVID-19 Education: The signs and symptoms of COVID-19 were discussed with the patient and how to seek care for testing if needed. The importance of social distancing was discussed today. . Reviewed expectations re: course of current medical issues. . Discussed self-management of symptoms. . Outlined signs and symptoms indicating need for more acute intervention. . Patient verbalized understanding and all questions were answered. . Health Maintenance issues including appropriate healthy diet, exercise, and smoking avoidance were discussed with patient. . See orders for this visit as documented in the electronic medical record.  Talia Aron, MD  Records requested if needed. Time spent: 25 minutes, of which >50% was spent in obtaining information about her symptoms, reviewing her previous labs, evaluations, and treatments, counseling her about her condition (please see the discussed topics above), and developing a plan to further investigate it; she had a number of questions which I addressed.   

## 2018-09-02 LAB — B. BURGDORFI ANTIBODIES: B burgdorferi Ab IgG+IgM: <0.91 {ISR} (ref 0.00–0.90)

## 2018-09-06 ENCOUNTER — Encounter: Payer: Self-pay | Admitting: Family Medicine

## 2018-09-06 LAB — RMSF, IGG, IFA: RMSF, IGG, IFA: 1:64 {titer} — ABNORMAL HIGH

## 2018-09-06 LAB — ROCKY MTN SPOTTED FVR ABS PNL(IGG+IGM)
RMSF IgG: POSITIVE — AB
RMSF IgM: 0.29 index (ref 0.00–0.89)

## 2018-09-07 ENCOUNTER — Ambulatory Visit: Payer: BC Managed Care – PPO | Admitting: Infectious Diseases

## 2018-09-07 ENCOUNTER — Encounter: Payer: Self-pay | Admitting: Infectious Diseases

## 2018-09-07 ENCOUNTER — Other Ambulatory Visit: Payer: Self-pay

## 2018-09-07 VITALS — BP 121/81 | HR 60 | Temp 97.9°F | Wt 141.0 lb

## 2018-09-07 DIAGNOSIS — A770 Spotted fever due to Rickettsia rickettsii: Secondary | ICD-10-CM

## 2018-09-07 DIAGNOSIS — W57XXXA Bitten or stung by nonvenomous insect and other nonvenomous arthropods, initial encounter: Secondary | ICD-10-CM

## 2018-09-07 DIAGNOSIS — S30861A Insect bite (nonvenomous) of abdominal wall, initial encounter: Secondary | ICD-10-CM

## 2018-09-07 NOTE — Progress Notes (Signed)
Subjective:    Patient ID: Holly Farley, female    DOB: June 11, 1980, 38 y.o.   MRN: [id number]  HPI the patient is a pleasant 38 year old obese white female diabetic who presents today for an evaluation following a recent tick bite.  Patient states she was bitten in mid April.  And subsequently developed fever and headache for approximately 2 weeks as well as malaise myalgias.  She was initially concerned she had COVID so she sought testing which was negative.  She then progressed to intermittent nausea and vomiting.  Interestingly, she denies any rash following the tick bite.  She did have multiple serologies obtained in the end of May confirming exposure to [facility] spotted fever.  Ehrlichia and Lyme serologies were both negative.  Unfortunately, she did not have routine blood work with CBC and CMP to see if she had concurrent transaminitis, bilirubin rise or thrombocytopenia.  She did complete a 2-week empiric course of doxycycline and has been feeling better since that time but is having some remnant headaches.   Past Medical History:  Diagnosis Date  . Diabetes mellitus without complication ([place])   . Insulin resistance   . Migraines   . PCOS (polycystic ovarian syndrome)    with infertility, followed by [facility]    Past Surgical History:  Procedure Laterality Date  . CESAREAN SECTION    . ESOPHAGOGASTRODUODENOSCOPY    . GASTRIC ROUX-EN-Y N/A 09/24/2014   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;  Surgeon: Johnathan Hausen, MD;  Location: [facility];  Service: General;  Laterality: N/A;  . TUBAL LIGATION       Family History  Problem Relation Age of Onset  . Bipolar disorder Mother   . Migraines Mother   . Bipolar disorder Brother   . Diabetes Other   . Hypertrophic cardiomyopathy Paternal Grandmother 74  . Hypertrophic cardiomyopathy Cousin 81     Social History   Tobacco Use  . Smoking status: Former Smoker    Quit date: 06/06/2007    Years since  quitting: 11.2  . Smokeless tobacco: Never Used  Substance Use Topics  . Alcohol use: No  . Drug use: No      reports being sexually active. She reports using the following method of birth control/protection: Surgical.   Outpatient Medications Prior to Visit  Medication Sig Dispense Refill  . azelastine (ASTELIN) 0.1 % nasal spray Place into the nose.    . [name] Take 1 tablet by mouth 3 (three) times daily.     . Clobetasol Propionate 0.05 % shampoo Massage into scalp twice daily. 118 mL 0  . Cyanocobalamin ([name]) 2500 MCG SUBL Place 2,500 mcg under the tongue daily.    . diclofenac sodium (VOLTAREN) 1 % GEL Apply 2 g topically 4 (four) times daily. To affected joint. 100 g 11  . Multiple Vitamin (MULTIVITAMIN WITH MINERALS) TABS tablet Take 1 tablet by mouth daily.    . ondansetron (ZOFRAN ODT) 4 MG disintegrating tablet Take 1 tablet (4 mg total) by mouth every 8 (eight) hours as needed for nausea or vomiting. 30 tablet 0   No facility-administered medications prior to visit.      No Known Allergies    Review of Systems  Constitutional: Negative for chills, fatigue and fever.  HENT: Negative for congestion, hearing loss and sinus pressure.   Eyes: Negative for photophobia, discharge, redness and visual disturbance.  Respiratory: Negative for apnea, cough, shortness of breath  and wheezing.   Cardiovascular: Negative for chest pain and leg swelling.  Gastrointestinal: Negative for abdominal distention, abdominal pain, constipation, diarrhea, nausea and vomiting.  Endocrine: Negative for cold intolerance, heat intolerance, polydipsia and polyuria.  Genitourinary: Negative for dysuria, flank pain, frequency, urgency, vaginal bleeding and vaginal discharge.  Musculoskeletal: Negative for arthralgias, back pain, joint swelling and neck pain.  Skin: Negative for pallor and rash.  Allergic/Immunologic: Negative for immunocompromised state.   Neurological: Negative for dizziness, seizures, speech difficulty, weakness and headaches.  Hematological: Does not bruise/bleed easily.  Psychiatric/Behavioral: Negative for agitation, confusion, hallucinations and sleep disturbance. The patient is not nervous/anxious.        Objective:    Vitals:   09/07/18 1054  BP: 121/81  Pulse: 60  Temp: 97.9 F (36.6 C)   Physical Exam Gen: pleasant, obese, NAD, A&Ox 3 Head: NCAT, no temporal wasting evident EENT: PERRL, EOMI, MMM, adequate dentition Neck: supple, no JVD CV: NRRR, no murmurs evident Pulm: CTA bilaterally, no wheeze or retractions Abd: soft, obese, NTND, +BS Extrems: trace LE edema, 2+ pulses Skin: no rashes, adequate skin turgor Neuro: CN II-XII grossly intact, no focal neurologic deficits appreciated, gait was normal, A&Ox 3   Labs: Lab Results  Component Value Date   WBC 8.0 09/01/2018   HGB 12.1 09/01/2018   HCT 35.5 (L) 09/01/2018   MCV 87.7 09/01/2018   PLT 233 09/01/2018   Lab Results  Component Value Date   NA 139 09/01/2018   K 3.8 09/01/2018   CL 107 09/01/2018   CO2 25 09/01/2018   GLUCOSE 89 09/01/2018   BUN 9 09/01/2018   CREATININE 0.62 09/01/2018   CALCIUM 8.9 09/01/2018   MG 1.9 04/25/2018   No results found for: CRP     Assessment & Plan:  Patient is a 38 year old obese white female diabetic presenting with positive RMSF serologies following a recent tick bite.  Positive RMSF serologies- while certainly RMSF is endemic throughout [place], it is interesting that this patient presented with no rash whatsoever but did have other symptoms that were consistent with possible tickborne illness.  Nonetheless, she did improve with oral doxycycline and should not need any further treatment.  It is encouraging that her RMSF convalescent sera are improving as well.  And I do not expect a prolonged illness for this patient beyond her initial recovery of 2 to 3 weeks.  A great deal of the visit  was spent today discussing tactics on how to avoid tick exposures moving forward.  Specifically, she was told to apply 30% DEET solution to exposed skin except for her face every 6 hours when hiking or performing tasks in high risk environments for ticks and such as tall grasses.  She also was instructed to wash her clothes and permethrin solution every 3 weeks to lower the risk for acquiring tics.  When she is in the woods or tall grassy areas, she should wear long pants and tucked the pant leg into the boots as to avoid ticks crawling inside her pants to bite her legs.

## 2018-09-07 NOTE — Patient Instructions (Signed)
Apply 30% DEET solution to exposed skin (except for face) every 4-6 hours when hiking or performing tasks in high risk environment for ticks. Wash clothes in permethrin solution every 3 weeks to lower risk for acquiring tick bites. Wear long pants and tuck pant leg into boots when in woods or tall grassy areas.

## 2018-09-08 LAB — MISC LABCORP TEST (SEND OUT)

## 2018-10-04 ENCOUNTER — Other Ambulatory Visit: Payer: Self-pay | Admitting: Family Medicine

## 2019-02-03 ENCOUNTER — Telehealth (INDEPENDENT_AMBULATORY_CARE_PROVIDER_SITE_OTHER): Payer: BC Managed Care – PPO | Admitting: Nurse Practitioner

## 2019-02-03 ENCOUNTER — Encounter: Payer: Self-pay | Admitting: Nurse Practitioner

## 2019-02-03 VITALS — Temp 97.8°F | Ht 61.5 in | Wt 142.0 lb

## 2019-02-03 DIAGNOSIS — R6889 Other general symptoms and signs: Secondary | ICD-10-CM

## 2019-02-03 MED ORDER — GUAIFENESIN ER 600 MG PO TB12: 600.0000 mg | ORAL_TABLET | Freq: Two times a day (BID) | ORAL | 0 refills | Status: DC | PRN

## 2019-02-03 MED ORDER — ONDANSETRON 4 MG PO TBDP: 4.0000 mg | ORAL_TABLET | Freq: Three times a day (TID) | ORAL | 0 refills | Status: DC | PRN

## 2019-02-03 MED ORDER — CHLORPHEN-PE-ACETAMINOPHEN 4-10-325 MG PO TABS: 1.0000 | ORAL_TABLET | Freq: Two times a day (BID) | ORAL | 0 refills | Status: AC

## 2019-02-03 NOTE — Patient Instructions (Signed)
Go to [place] regional COVID test site for swab. Site is open 10am to 3pm.  Maintain adequate oral hydration.  This information is directly available on the CDC website: RunningShows.co.za.html    Source:CDC Reference to specific commercial products, manufacturers, companies, or trademarks does not constitute its endorsement or recommendation by the [place], [place], or Centers for [employer] and Prevention.

## 2019-02-03 NOTE — Progress Notes (Signed)
Virtual Visit via Video Note  I connected with Holly Farley on 02/03/19 at  2:00 PM EST by a video enabled telemedicine application and verified that I am speaking with the correct person using two identifiers.  Location: Patient: in private car Provider: office Participants: patient, her son, and provider I discussed the limitations of evaluation and management by telemedicine and the availability of in person appointments. The patient expressed understanding and agreed to proceed.  CC: pt is c/o of sinus pressure/headache,sneezing,headache,body tired,spot comes and goes itchy at times,vomitted today 1 time/going on 3 days/took allegra D--need refill for zofran?  History of Present Illness: URI  This is a new problem. The current episode started in the past 7 days. The problem has been unchanged. There has been no fever. Associated symptoms include congestion, coughing, headaches, nausea, rhinorrhea, sinus pain, sneezing and a sore throat. Pertinent negatives include no abdominal pain, chest pain, dysuria, ear pain, joint pain, neck pain, swollen glands, vomiting or wheezing. She has tried nothing for the symptoms.   Observations/Objective: Physical Exam  Constitutional: She is oriented to person, place, and time. No distress.  Pulmonary/Chest: Effort normal.  Neurological: She is alert and oriented to person, place, and time.  Vitals reviewed.  Assessment and Plan: Taisha was seen today for nasal congestion.  Diagnoses and all orders for this visit:  Flu-like symptoms -     Chlorphen-PE-Acetaminophen 4-10-325 MG TABS; Take 1 tablet by mouth every 12 (twelve) hours for 3 days. -     guaiFENesin (MUCINEX) 600 MG 12 hr tablet; Take 1 tablet (600 mg total) by mouth 2 (two) times daily as needed for cough or to loosen phlegm. -     ondansetron (ZOFRAN-ODT) 4 MG disintegrating tablet; Take 1 tablet (4 mg total) by mouth every 8 (eight) hours as needed for nausea or vomiting.   Follow  Up Instructions: See avs   I discussed the assessment and treatment plan with the patient. The patient was provided an opportunity to ask questions and all were answered. The patient agreed with the plan and demonstrated an understanding of the instructions.   The patient was advised to call back or seek an in-person evaluation if the symptoms worsen or if the condition fails to improve as anticipated.  Wilfred Lacy, NP

## 2019-02-06 ENCOUNTER — Encounter: Payer: Self-pay | Admitting: Nurse Practitioner

## 2019-05-05 ENCOUNTER — Encounter: Payer: Self-pay | Admitting: Primary Care

## 2019-05-05 ENCOUNTER — Ambulatory Visit: Payer: BC Managed Care – PPO | Admitting: Primary Care

## 2019-05-05 ENCOUNTER — Other Ambulatory Visit: Payer: Self-pay

## 2019-05-05 DIAGNOSIS — Z9884 Bariatric surgery status: Secondary | ICD-10-CM

## 2019-05-05 DIAGNOSIS — A770 Spotted fever due to Rickettsia rickettsii: Secondary | ICD-10-CM

## 2019-05-05 DIAGNOSIS — M2550 Pain in unspecified joint: Secondary | ICD-10-CM | POA: Diagnosis not present

## 2019-05-05 DIAGNOSIS — L409 Psoriasis, unspecified: Secondary | ICD-10-CM

## 2019-05-05 DIAGNOSIS — M797 Fibromyalgia: Secondary | ICD-10-CM | POA: Insufficient documentation

## 2019-05-05 LAB — C-REACTIVE PROTEIN: CRP: <1 mg/dL (ref 0.5–20.0)

## 2019-05-05 LAB — SEDIMENTATION RATE: Sed Rate: 8 mm/hr (ref 0–20)

## 2019-05-05 NOTE — Assessment & Plan Note (Signed)
Diagnosed last Summer, has residual body aches, fatigue, headaches since.   Work up in process for autoimmune, rheumatological evaluation.

## 2019-05-05 NOTE — Assessment & Plan Note (Signed)
Chronic since July 2020, now seems to be increased. Numerous sites of pain including shoulders, hips, wrists, back.  Differentials include PMR, autoimmune disorder. Checking labs today to rule out.   Consider rheumatology evaluation.

## 2019-05-05 NOTE — Assessment & Plan Note (Signed)
Doing well since, weight has been well controlled. Compliant to vitamin B12 and D.

## 2019-05-05 NOTE — Patient Instructions (Signed)
Stop by the lab prior to leaving today. I will notify you of your results once received.   It was a pleasure meeting you!  

## 2019-05-05 NOTE — Progress Notes (Signed)
Subjective:    Patient ID: Holly Farley, female    DOB: May 27, 1980, 39 y.o.   MRN: [id number]  HPI  This visit occurred during the SARS-CoV-2 public health emergency.  Safety protocols were in place, including screening questions prior to the visit, additional usage of staff PPE, and extensive cleaning of exam room while observing appropriate contact time as indicated for disinfecting solutions.   Ms. Turbin is a 39 year old female patient of Dr. Deborra Medina who presents today to transfer care.  1) Rosacea/Psoriasis: Currently managed on clobetasol propionate shampoo for which she uses as needed. Diagnosed in middle school with "raindrop psoriasis" Typically gets the rashes to her scalp, extremities, trunk.   2) S/P Gastric Bypass: Completed in 2016. History of type 2 diabetes and was once managed on Metformin, since bypass surgery her diabetes has resolved.   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 Symptoms of joint aches (mostly right sided hip, shoulder, right thoracic back, wrist), migraines, neck pain with compressed nerves and radiation of pain down right upper extremity, fatigue. Chronic since diagnosis. Currently following with chiropractor, has done massage therapy. She was evaluated in mid February 2021 at [facility], underwent work up for fatigue, everything mostly normal except for low vitamin D.   4) Vitamin D Deficiency: Level of 22 in mid February 2021, she is now taking 5000 units daily.   Wt Readings from Last 3 Encounters:  05/05/19 147 lb 4 oz (66.8 kg)  02/03/19 142 lb (64.4 kg)  09/07/18 141 lb (64 kg)     BP Readings from Last 3 Encounters:  05/05/19 94/64  09/07/18 121/81  05/16/18 110/62     Review of Systems  Constitutional: Positive for fatigue.  Musculoskeletal: Positive for arthralgias.  Skin: Negative for color change.  Allergic/Immunologic: Positive for environmental allergies.  Neurological: Positive for headaches. Negative for dizziness.         Past Medical History:  Diagnosis Date  . Diabetes mellitus without complication ([place])   . Insulin resistance   . Migraines   . PCOS (polycystic ovarian syndrome)    with infertility, followed by [facility]     Social History   Socioeconomic History  . Marital status: Married    Spouse name: Not on file  . Number of children: 2  . Years of education: Not on file  . Highest education level: Bachelor's degree (e.g., BA, AB, BS)  Occupational History  . Not on file  Tobacco Use  . Smoking status: Former Smoker    Quit date: 06/06/2007    Years since quitting: 11.9  . Smokeless tobacco: Never Used  Substance and Sexual Activity  . Alcohol use: No  . Drug use: No  . Sexual activity: Yes    Birth control/protection: Surgical    Comment: tubal ligation  Other Topics Concern  . Not on file  Social History Narrative   Caffeine- 300 mg daily   Social Determinants of Health   Financial Resource Strain:   . Difficulty of Paying Living Expenses: Not on file  Food Insecurity:   . Worried About Charity fundraiser in the Last Year: Not on file  . Ran Out of Food in the Last Year: Not on file  Transportation Needs:   . Lack of Transportation (Medical): Not on file  . Lack of Transportation (Non-Medical): Not on file  Physical Activity:   . Days of Exercise per Week: Not on file  . Minutes of Exercise per Session: Not on file  Stress:   . Feeling of Stress : Not on file  Social Connections:   . Frequency of Communication with Friends and Family: Not on file  . Frequency of Social Gatherings with Friends and Family: Not on file  . Attends Religious Services: Not on file  . Active Member of Clubs or Organizations: Not on file  . Attends Banker Meetings: Not on file  . Marital Status: Not on file  Intimate Partner Violence:   . Fear of Current or Ex-Partner: Not on file  . Emotionally Abused: Not on file  . Physically Abused: Not on file  . Sexually Abused: Not on file     Past Surgical History:  Procedure Laterality Date  . CESAREAN SECTION    . ESOPHAGOGASTRODUODENOSCOPY    . GASTRIC ROUX-EN-Y N/A 09/24/2014   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;  Surgeon: Luretha Murphy, MD;  Location: [facility];  Service: General;  Laterality: N/A;  . TUBAL LIGATION      Family History  Problem Relation Age of Onset  . Bipolar disorder Mother   . Migraines Mother   . Bipolar disorder Brother   . Diabetes Other   . Hypertrophic cardiomyopathy Paternal Grandmother 39  . Hypertrophic cardiomyopathy Cousin 31    No Known Allergies  Current Outpatient Medications on File Prior to Visit  Medication Sig Dispense Refill  . azelastine (ASTELIN) 0.1 % nasal spray Place into the nose.    . [name] Take 1 tablet by mouth 3 (three) times daily.     . Clobetasol Propionate 0.05 % shampoo Massage into scalp twice daily. 118 mL 0  . Cyanocobalamin ([name]) 2500 MCG SUBL Place 2,500 mcg under the tongue daily.    . diclofenac sodium (VOLTAREN) 1 % GEL Apply 2 g topically 4 (four) times daily. To affected joint. 100 g 11  . Multiple Vitamin (MULTIVITAMIN WITH MINERALS) TABS tablet Take 1 tablet by mouth daily.     No current facility-administered medications on file prior to visit.    BP 94/64   Pulse 80   Temp (!) 96.9 F (36.1 C) (Temporal)   Ht 5\' 2"  (1.575 m)   Wt 147 lb 4 oz (66.8 kg)   LMP 04/22/2019   SpO2 98%   BMI 26.93 kg/m    Objective:   Physical Exam  Constitutional: She appears well-nourished.  Cardiovascular: Normal rate and regular rhythm.  Respiratory: Effort normal and breath sounds normal.  Musculoskeletal:     Cervical back: Neck supple.  Skin: Skin is warm and dry.  Psychiatric: She has a normal mood and affect.           Assessment & Plan:

## 2019-05-05 NOTE — Assessment & Plan Note (Signed)
Chronic and diagnosed during high school years.  Using clobetasol shampoo as needed.  Work up in process today for complaints of joint aches, etc. Question whether she may have psoriatic arthritis.

## 2019-05-08 ENCOUNTER — Other Ambulatory Visit: Payer: Self-pay | Admitting: Primary Care

## 2019-05-08 DIAGNOSIS — L409 Psoriasis, unspecified: Secondary | ICD-10-CM

## 2019-05-08 DIAGNOSIS — R768 Other specified abnormal immunological findings in serum: Secondary | ICD-10-CM

## 2019-05-08 DIAGNOSIS — M2550 Pain in unspecified joint: Secondary | ICD-10-CM

## 2019-05-09 LAB — RHEUMATOID FACTOR: Rheumatoid fact SerPl-aCnc: <14 IU/mL (ref <14)

## 2019-05-09 LAB — ANTI-NUCLEAR AB-TITER (ANA TITER): ANA Titer 1: 1:40 {titer} — ABNORMAL HIGH

## 2019-05-09 LAB — CYCLIC CITRUL PEPTIDE ANTIBODY, IGG: Cyclic Citrullin Peptide Ab: <16 UNITS

## 2019-05-09 LAB — ANA: Anti Nuclear Antibody (ANA): POSITIVE — AB

## 2019-05-16 DIAGNOSIS — M7918 Myalgia, other site: Secondary | ICD-10-CM | POA: Insufficient documentation

## 2019-05-16 DIAGNOSIS — R768 Other specified abnormal immunological findings in serum: Secondary | ICD-10-CM | POA: Insufficient documentation

## 2019-05-16 DIAGNOSIS — Z8619 Personal history of other infectious and parasitic diseases: Secondary | ICD-10-CM

## 2019-05-16 DIAGNOSIS — M2550 Pain in unspecified joint: Secondary | ICD-10-CM | POA: Insufficient documentation

## 2019-05-16 HISTORY — DX: Personal history of other infectious and parasitic diseases: Z86.19

## 2019-06-28 DIAGNOSIS — R768 Other specified abnormal immunological findings in serum: Secondary | ICD-10-CM | POA: Insufficient documentation

## 2019-07-03 ENCOUNTER — Telehealth: Payer: Self-pay | Admitting: Primary Care

## 2019-07-03 NOTE — Telephone Encounter (Signed)
Patient called. She stated she was referred to a rheumatologist. They have diagnosed her with fibromyalgia . The Rheumatologist advised her that usually when patient's are diagnosed with this then it is turned over to the primary for them to prescribe medication and follow patient. Patient is following up on this to see if notes where passed along to you and if you will prescribe medication for the fibromyalgia.   Please advise

## 2019-07-03 NOTE — Telephone Encounter (Signed)
Noted. Please have her follow up with me either virtually or in the office to discuss options for treatment. I reviewed the rheumatologist's notes.

## 2019-07-06 ENCOUNTER — Ambulatory Visit: Payer: BC Managed Care – PPO | Admitting: Primary Care

## 2019-07-06 ENCOUNTER — Encounter: Payer: Self-pay | Admitting: Primary Care

## 2019-07-06 ENCOUNTER — Other Ambulatory Visit: Payer: Self-pay

## 2019-07-06 ENCOUNTER — Other Ambulatory Visit: Payer: Self-pay | Admitting: Primary Care

## 2019-07-06 DIAGNOSIS — M797 Fibromyalgia: Secondary | ICD-10-CM | POA: Diagnosis not present

## 2019-07-06 DIAGNOSIS — G43909 Migraine, unspecified, not intractable, without status migrainosus: Secondary | ICD-10-CM | POA: Insufficient documentation

## 2019-07-06 DIAGNOSIS — G43709 Chronic migraine without aura, not intractable, without status migrainosus: Secondary | ICD-10-CM

## 2019-07-06 MED ORDER — ONDANSETRON 4 MG PO TBDP: 4.0000 mg | ORAL_TABLET | Freq: Two times a day (BID) | ORAL | 0 refills | Status: DC | PRN

## 2019-07-06 MED ORDER — DULOXETINE HCL 20 MG PO CPEP: 20.0000 mg | ORAL_CAPSULE | Freq: Every day | ORAL | 1 refills | Status: DC

## 2019-07-06 NOTE — Assessment & Plan Note (Signed)
Diagnosed officially by rheumatology. Discussed options for treatment, encouraged aerobic exercise and stretching daily.  She agrees to starting Cymbalta. Discussed potential side effects. Will start with 20 mg once daily, consider dose increase to 40 mg if needed. She will update in 4 weeks.

## 2019-07-06 NOTE — Progress Notes (Signed)
Subjective:    Patient ID: Holly Farley, female    DOB: [birth year], 39 y.o.   MRN: [id number]  HPI  This visit occurred during the SARS-CoV-2 public health emergency.  Safety protocols were in place, including screening questions prior to the visit, additional usage of staff PPE, and extensive cleaning of exam room while observing appropriate contact time as indicated for disinfecting solutions.   Holly Farley is a 39 year old female with a history of psoriasis, gastric bypass surgery, RMSF, joint pain, PCOS, positive ANA who presents today to discuss recent rheumatology visits.  She was last evaluated in our office on 05/05/19 with reports of chronic joint pain affecting numerous joints. Work up during that visit included labs for autoimmune cause, this resulted with positive ANA 1:40. She was referred to rheumatology for evaluation.   She was evaluated by rheumatology twice, positive ANA was determined to be insignificant, diagnosed with fibromyalgia, and told to follow back up with PCP for treatment.  She is open to treatment with Cymbalta given increased family stress along with chronic pain.  She is wanting a refill of her Zofran for which she uses sparingly for migraines and occasional nausea that occurs since her gastric bypass. She's suffered with chronic migraines since high school. Overall migraines have improved over time, but she was once managed on Depakote, Trazodone, and Imitrex per the headache center years ago.   Review of Systems  Musculoskeletal: Positive for arthralgias, myalgias and neck pain.  Neurological: Positive for headaches.  Psychiatric/Behavioral:       Increased stress with personal life       Past Medical History:  Diagnosis Date  . Acute bilateral thoracic back pain 03/03/2018  . Diabetes mellitus without complication ([facility])   . Fever and chills 07/04/2018  . History of diet-controlled diabetes 01/13/2018  . Insulin resistance   . Migraines   . PCOS  (polycystic ovarian syndrome)    with infertility, followed by [facility]  . Syncope 05/16/2018  . Weight loss, unintentional 01/13/2018     Social History   Socioeconomic History  . Marital status: Married    Spouse name: Not on file  . Number of children: 2  . Years of education: Not on file  . Highest education level: Bachelor's degree (e.g., BA, AB, BS)  Occupational History  . Not on file  Tobacco Use  . Smoking status: Former Smoker    Quit date: 06/06/2007    Years since quitting: 12.0  . Smokeless tobacco: Never Used  Substance and Sexual Activity  . Alcohol use: No  . Drug use: No  . Sexual activity: Yes    Birth control/protection: Surgical    Comment: tubal ligation  Other Topics Concern  . Not on file  Social History Narrative   Caffeine- 300 mg daily   Social Determinants of Health   Financial Resource Strain:   . Difficulty of Paying Living Expenses:   Food Insecurity:   . Worried About Programme researcher, broadcasting/film/video in the Last Year:   . Barista in the Last Year:   Transportation Needs:   . Freight forwarder (Medical):   Marland Kitchen Lack of Transportation (Non-Medical):   Physical Activity:   . Days of Exercise per Week:   . Minutes of Exercise per Session:   Stress:   . Feeling of Stress :   Social Connections:   . Frequency of Communication with Friends and Family:   . Frequency of Social Gatherings with  Friends and Family:   . Attends Religious Services:   . Active Member of Clubs or Organizations:   . Attends Archivist Meetings:   Marland Kitchen Marital Status:   Intimate Partner Violence:   . Fear of Current or Ex-Partner:   . Emotionally Abused:   Marland Kitchen Physically Abused:   . Sexually Abused:     Past Surgical History:  Procedure Laterality Date  . CESAREAN SECTION    . ESOPHAGOGASTRODUODENOSCOPY    . GASTRIC ROUX-EN-Y N/A 09/24/2014   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;  Surgeon: Johnathan Hausen, MD;  Location: [facility];   Service: General;  Laterality: N/A;  . TUBAL LIGATION      Family History  Problem Relation Age of Onset  . Bipolar disorder Mother   . Migraines Mother   . Bipolar disorder Brother   . Diabetes Other   . Hypertrophic cardiomyopathy Paternal Grandmother 74  . Hypertrophic cardiomyopathy Cousin 31    No Known Allergies  Current Outpatient Medications on File Prior to Visit  Medication Sig Dispense Refill  . azelastine (ASTELIN) 0.1 % nasal spray Place into the nose.    . [name] Take 1 tablet by mouth 3 (three) times daily.     . Clobetasol Propionate 0.05 % shampoo Massage into scalp twice daily. 118 mL 0  . Cyanocobalamin ([name]) 2500 MCG SUBL Place 2,500 mcg under the tongue daily.    . Multiple Vitamin (MULTIVITAMIN WITH MINERALS) TABS tablet Take 1 tablet by mouth daily.     No current facility-administered medications on file prior to visit.    BP 100/68   Pulse 78   Temp (!) 96.2 F (35.7 C) (Temporal)   Ht 5\' 2"  (1.575 m)   Wt 146 lb 8 oz (66.5 kg)   LMP 06/23/2019   SpO2 98%   BMI 26.80 kg/m    Objective:   Physical Exam  Constitutional: She appears well-nourished.  Cardiovascular: Normal rate and regular rhythm.  Respiratory: Effort normal and breath sounds normal.  Musculoskeletal:     Cervical back: Neck supple.  Skin: Skin is warm and dry.  Psychiatric: She has a normal mood and affect.           Assessment & Plan:

## 2019-07-06 NOTE — Patient Instructions (Signed)
Start duloxetine (Cymbalta) 20 mg once daily for pain. Please update me in 4 weeks.  Use the ondansetron (Zofran) sparingly as discussed.  It was a pleasure to see you today!

## 2019-07-06 NOTE — Assessment & Plan Note (Addendum)
Chronic history of migraines since high school. Managed on Imitrex injections, Depakote, Trazodone in the past without improvement.  Triggered by neck pain from fibromyalgia.  Uses Zofran sparingly now if she does experience migraines. Will provide small dose refill of Zofran to use sparingly.  She will update if migraines become more frequent.

## 2019-08-25 ENCOUNTER — Other Ambulatory Visit: Payer: Self-pay | Admitting: Primary Care

## 2019-08-25 DIAGNOSIS — M797 Fibromyalgia: Secondary | ICD-10-CM

## 2019-08-25 NOTE — Telephone Encounter (Signed)
Last office visit 07/06/2019 for follow up.  Patient was started on Cymbalta and was told to update Jae Dire in 4 weeks.  No update/no future appointments.  Ok to refill?

## 2019-08-25 NOTE — Telephone Encounter (Signed)
See my chart message

## 2019-08-29 NOTE — Telephone Encounter (Signed)
Holly Farley, I sent her a MyChart message last week, she has not yet viewed. Please call patient, see message.

## 2019-11-08 NOTE — Telephone Encounter (Signed)
Holly Farley, will you get her scheduled for 12 pm on 11/09/19? Reason: ear pain

## 2019-11-09 ENCOUNTER — Encounter: Payer: Self-pay | Admitting: Primary Care

## 2019-11-09 ENCOUNTER — Other Ambulatory Visit: Payer: Self-pay

## 2019-11-09 ENCOUNTER — Ambulatory Visit (INDEPENDENT_AMBULATORY_CARE_PROVIDER_SITE_OTHER)
Admission: RE | Admit: 2019-11-09 | Discharge: 2019-11-09 | Disposition: A | Discharge status: Home / Self Care | Payer: BC Managed Care – PPO | Source: Ambulatory Visit | Attending: Primary Care | Admitting: Primary Care

## 2019-11-09 ENCOUNTER — Ambulatory Visit: Payer: BC Managed Care – PPO | Admitting: Primary Care

## 2019-11-09 VITALS — BP 100/78 | HR 95 | Ht 61.5 in | Wt 150.0 lb

## 2019-11-09 DIAGNOSIS — H6983 Other specified disorders of Eustachian tube, bilateral: Secondary | ICD-10-CM | POA: Diagnosis not present

## 2019-11-09 DIAGNOSIS — R05 Cough: Secondary | ICD-10-CM

## 2019-11-09 DIAGNOSIS — J309 Allergic rhinitis, unspecified: Secondary | ICD-10-CM | POA: Insufficient documentation

## 2019-11-09 DIAGNOSIS — R059 Cough, unspecified: Secondary | ICD-10-CM | POA: Insufficient documentation

## 2019-11-09 DIAGNOSIS — H6993 Unspecified Eustachian tube disorder, bilateral: Secondary | ICD-10-CM | POA: Insufficient documentation

## 2019-11-09 MED ORDER — PREDNISONE 20 MG PO TABS: ORAL_TABLET | ORAL | 0 refills | Status: DC

## 2019-11-09 NOTE — Progress Notes (Signed)
Subjective:    Patient ID: Holly Farley, female    DOB: 03-01-1981, 39 y.o.   MRN: [id number]  HPI  This visit occurred during the SARS-CoV-2 public health emergency.  Safety protocols were in place, including screening questions prior to the visit, additional usage of staff PPE, and extensive cleaning of exam room while observing appropriate contact time as indicated for disinfecting solutions.   Holly Farley is a 39 year old female with a history of migraines, allergic rhinitis, RMSF, fibromyalgia, who presents today with a chief complaint of otalgia.  History of chronic "broken speaker sound" that has been present for years. Over time she's been told that was a sinus issue. Has never seen ENT.  One week ago she began to experience right sided ear pain which radiated down to her right jaw. Also with sinus pressure to maxillary and frontal region, sore throat with swallowing, cough (worse at night), chest tightness when laying flat and with colder air.  She was evaluated at [facility] 8 days ago, diagnosed with "double ear infection and possible sinus infection", was prescribed a seven day course of Cefdinir. Also tested negative for Covid-19, has been vaccinated.   Since treatment last week she's completed her antibiotics but has not had relief. She's also used cough drops, Cold and Cough Nyquil. Overall today she's about the same.   Review of Systems  Constitutional: Positive for fatigue. Negative for chills and fever.  HENT: Positive for postnasal drip, sinus pressure, sinus pain and sore throat.   Respiratory: Positive for cough and shortness of breath.   Neurological: Positive for headaches.       Past Medical History:  Diagnosis Date  . Acute bilateral thoracic back pain 03/03/2018  . Diabetes mellitus without complication ([facility])   . Fever and chills 07/04/2018  . History of diet-controlled diabetes 01/13/2018  . Insulin resistance   . Migraines   . PCOS (polycystic  ovarian syndrome)    with infertility, followed by [facility]  . Syncope 05/16/2018  . Weight loss, unintentional 01/13/2018     Social History   Socioeconomic History  . Marital status: Married    Spouse name: Not on file  . Number of children: 2  . Years of education: Not on file  . Highest education level: Bachelor's degree (e.g., BA, AB, BS)  Occupational History  . Not on file  Tobacco Use  . Smoking status: Former Smoker    Quit date: 06/06/2007    Years since quitting: 12.4  . Smokeless tobacco: Never Used  Substance and Sexual Activity  . Alcohol use: No  . Drug use: No  . Sexual activity: Yes    Birth control/protection: Surgical    Comment: tubal ligation  Other Topics Concern  . Not on file  Social History Narrative   Caffeine- 300 mg daily   Social Determinants of Health   Financial Resource Strain:   . Difficulty of Paying Living Expenses: Not on file  Food Insecurity:   . Worried About Programme researcher, broadcasting/film/video in the Last Year: Not on file  . Ran Out of Food in the Last Year: Not on file  Transportation Needs:   . Lack of Transportation (Medical): Not on file  . Lack of Transportation (Non-Medical): Not on file  Physical Activity:   . Days of Exercise per Week: Not on file  . Minutes of Exercise per Session: Not on file  Stress:   . Feeling of Stress : Not on file  Social Connections:   . Frequency of Communication with Friends and Family: Not on file  . Frequency of Social Gatherings with Friends and Family: Not on file  . Attends Religious Services: Not on file  . Active Member of Clubs or Organizations: Not on file  . Attends Banker Meetings: Not on file  . Marital Status: Not on file  Intimate Partner Violence:   . Fear of Current or Ex-Partner: Not on file  . Emotionally Abused: Not on file  . Physically Abused: Not on file  . Sexually Abused: Not on file    Past Surgical History:  Procedure Laterality Date  . CESAREAN SECTION    .  ESOPHAGOGASTRODUODENOSCOPY    . GASTRIC ROUX-EN-Y N/A 09/24/2014   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;  Surgeon: Luretha Murphy, MD;  Location: [facility];  Service: General;  Laterality: N/A;  . TUBAL LIGATION      Family History  Problem Relation Age of Onset  . Bipolar disorder Mother   . Migraines Mother   . Bipolar disorder Brother   . Diabetes Other   . Hypertrophic cardiomyopathy Paternal Grandmother 53  . Hypertrophic cardiomyopathy Cousin 31    No Known Allergies  Current Outpatient Medications on File Prior to Visit  Medication Sig Dispense Refill  . azelastine (ASTELIN) 0.1 % nasal spray USE 1 SPRAY IN BOTH NOSRILS TWICE DAILY 30 mL 0  . [name] Take 1 tablet by mouth 3 (three) times daily.     . Clobetasol Propionate 0.05 % shampoo Massage into scalp twice daily. 118 mL 0  . Cyanocobalamin ([name]) 2500 MCG SUBL Place 2,500 mcg under the tongue daily.    . DULoxetine (CYMBALTA) 30 MG capsule Take 1 capsule (30 mg total) by mouth daily. For pain. 90 capsule 0  . Multiple Vitamin (MULTIVITAMIN WITH MINERALS) TABS tablet Take 1 tablet by mouth daily.    . ondansetron (ZOFRAN ODT) 4 MG disintegrating tablet Take 1 tablet (4 mg total) by mouth 2 (two) times daily as needed for nausea or vomiting. 15 tablet 0   No current facility-administered medications on file prior to visit.    BP 100/78   Pulse 95   Ht 5' 1.5" (1.562 m)   Wt 150 lb (68 kg)   SpO2 98%   BMI 27.88 kg/m    Objective:   Physical Exam Constitutional:      Appearance: She is not ill-appearing.  HENT:     Right Ear: Tympanic membrane and ear canal normal.     Left Ear: Tympanic membrane and ear canal normal.     Nose: No mucosal edema.     Right Sinus: No maxillary sinus tenderness or frontal sinus tenderness.     Left Sinus: No maxillary sinus tenderness or frontal sinus tenderness.  Cardiovascular:     Rate and Rhythm:  Normal rate and regular rhythm.  Pulmonary:     Effort: Pulmonary effort is normal.     Breath sounds: Normal breath sounds. No wheezing.  Musculoskeletal:     Cervical back: Neck supple.  Lymphadenopathy:     Cervical: No cervical adenopathy.  Skin:    General: Skin is warm and dry.            Assessment & Plan:

## 2019-11-09 NOTE — Assessment & Plan Note (Addendum)
Acute for the last 8 days, no improvement after treatment with cephalosporin. Exam today with clear lungs, doesn't appear sickly.  Check chest xray today given chest tightness with cough. Lower suspicion for PE. Negative Covid-19 test. Could be allergy involvement from PND. No prior history of asthma.   Await results.

## 2019-11-09 NOTE — Patient Instructions (Addendum)
Complete xray(s) prior to leaving today. I will notify you of your results once received.  Start prednisone 20 mg tablets. Take 2 tablets daily for 5 days. Resume Allegra daily.  Please update me in one week.  It was a pleasure to see you today!

## 2019-11-09 NOTE — Assessment & Plan Note (Signed)
Seems acute on chronic. Exam today without evidence for infection. Already treated with cephalosporin.   Rx for prednisone course sent to pharmacy. Resume Allegra.   Consider ENT referral. She will update.

## 2019-11-10 ENCOUNTER — Other Ambulatory Visit: Payer: Self-pay | Admitting: Primary Care

## 2019-11-10 DIAGNOSIS — J189 Pneumonia, unspecified organism: Secondary | ICD-10-CM

## 2019-11-10 MED ORDER — DOXYCYCLINE HYCLATE 100 MG PO TABS: 100.0000 mg | ORAL_TABLET | Freq: Two times a day (BID) | ORAL | 0 refills | Status: DC

## 2019-12-04 ENCOUNTER — Other Ambulatory Visit: Payer: Self-pay

## 2019-12-04 DIAGNOSIS — H6983 Other specified disorders of Eustachian tube, bilateral: Secondary | ICD-10-CM

## 2019-12-04 NOTE — Progress Notes (Signed)
refer

## 2020-01-02 ENCOUNTER — Other Ambulatory Visit: Payer: Self-pay

## 2020-01-02 ENCOUNTER — Encounter (INDEPENDENT_AMBULATORY_CARE_PROVIDER_SITE_OTHER): Payer: Self-pay | Admitting: Otolaryngology

## 2020-01-02 ENCOUNTER — Ambulatory Visit (INDEPENDENT_AMBULATORY_CARE_PROVIDER_SITE_OTHER): Payer: BC Managed Care – PPO | Admitting: Otolaryngology

## 2020-01-02 VITALS — Temp 97.0°F

## 2020-01-02 DIAGNOSIS — H9042 Sensorineural hearing loss, unilateral, left ear, with unrestricted hearing on the contralateral side: Secondary | ICD-10-CM | POA: Diagnosis not present

## 2020-01-02 NOTE — Progress Notes (Signed)
HPI: Holly Farley is a 39 y.o. female who presents is referred by her PCP for evaluation of left ear complaints..  Over the past 4 to 5 months she has had intermittent episodes of decreased hearing in the left ear with sensation of blockage of the ear.  She has tried several earwax removal systems without benefit.  She has had history of sinus infections in the past especially with change of seasons.  She feels a little bit full in the left ear today and presents here for further evaluation.  She has had no trouble breathing through her nose.. The fluctuation in the left ear hearing occurs almost on a daily basis. Patient does have history of migraine headaches. Denies episodes of dizziness or vertigo.  Past Medical History:  Diagnosis Date  . Acute bilateral thoracic back pain 03/03/2018  . Diabetes mellitus without complication (HCC)   . Fever and chills 07/04/2018  . History of diet-controlled diabetes 01/13/2018  . Insulin resistance   . Migraines   . PCOS (polycystic ovarian syndrome)    with infertility, followed by [facility]  . Syncope 05/16/2018  . Weight loss, unintentional 01/13/2018   Past Surgical History:  Procedure Laterality Date  . CESAREAN SECTION    . ESOPHAGOGASTRODUODENOSCOPY    . GASTRIC ROUX-EN-Y N/A 09/24/2014   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;  Surgeon: Luretha Murphy, MD;  Location: [facility];  Service: General;  Laterality: N/A;  . TUBAL LIGATION     Social History   Socioeconomic History  . Marital status: Married    Spouse name: Not on file  . Number of children: 2  . Years of education: Not on file  . Highest education level: Bachelor's degree (e.g., BA, AB, BS)  Occupational History  . Not on file  Tobacco Use  . Smoking status: Former Smoker    Packs/day: 0.75    Years: 7.00    Pack years: 5.25    Start date: 2002    Quit date: 06/06/2007    Years since quitting: 12.5  . Smokeless tobacco: Never Used  Substance and  Sexual Activity  . Alcohol use: No  . Drug use: No  . Sexual activity: Yes    Birth control/protection: Surgical    Comment: tubal ligation  Other Topics Concern  . Not on file  Social History Narrative   Caffeine- 300 mg daily   Social Determinants of Health   Financial Resource Strain:   . Difficulty of Paying Living Expenses: Not on file  Food Insecurity:   . Worried About Programme researcher, broadcasting/film/video in the Last Year: Not on file  . Ran Out of Food in the Last Year: Not on file  Transportation Needs:   . Lack of Transportation (Medical): Not on file  . Lack of Transportation (Non-Medical): Not on file  Physical Activity:   . Days of Exercise per Week: Not on file  . Minutes of Exercise per Session: Not on file  Stress:   . Feeling of Stress : Not on file  Social Connections:   . Frequency of Communication with Friends and Family: Not on file  . Frequency of Social Gatherings with Friends and Family: Not on file  . Attends Religious Services: Not on file  . Active Member of Clubs or Organizations: Not on file  . Attends Banker Meetings: Not on file  . Marital Status: Not on file   Family History  Problem Relation Age of Onset  .  Bipolar disorder Mother   . Migraines Mother   . Bipolar disorder Brother   . Diabetes Other   . Hypertrophic cardiomyopathy Paternal Grandmother 98  . Hypertrophic cardiomyopathy Cousin 31   No Known Allergies Prior to Admission medications   Medication Sig Start Date End Date Taking? Authorizing Provider  azelastine (ASTELIN) 0.1 % nasal spray USE 1 SPRAY IN BOTH NOSRILS TWICE DAILY 07/07/19  Yes Baity, Salvadore Oxford, NP  [name] Take 1 tablet by mouth 3 (three) times daily.    Yes [provider]  Clobetasol Propionate 0.05 % shampoo Massage into scalp twice daily. 03/22/18  Yes Mliss Sax, MD  Cyanocobalamin ([name]) 2500 MCG SUBL Place 2,500 mcg under the tongue daily.   Yes  [provider]  doxycycline (VIBRA-TABS) 100 MG tablet Take 1 tablet (100 mg total) by mouth 2 (two) times daily. 11/10/19  Yes Doreene Nest, NP  DULoxetine (CYMBALTA) 30 MG capsule Take 1 capsule (30 mg total) by mouth daily. For pain. 08/31/19  Yes Doreene Nest, NP  Multiple Vitamin (MULTIVITAMIN WITH MINERALS) TABS tablet Take 1 tablet by mouth daily.   Yes [provider]  ondansetron (ZOFRAN ODT) 4 MG disintegrating tablet Take 1 tablet (4 mg total) by mouth 2 (two) times daily as needed for nausea or vomiting. 07/06/19  Yes Doreene Nest, NP  predniSONE (DELTASONE) 20 MG tablet Take 2 tablets by mouth once daily for five days. 11/09/19  Yes Doreene Nest, NP     Positive ROS: Otherwise negative  All other systems have been reviewed and were otherwise negative with the exception of those mentioned in the HPI and as above.  Physical Exam: Constitutional: Alert, well-appearing, no acute distress Ears: External ears without lesions or tenderness. Ear canals are clear bilaterally.  Both TMs are clear with good mobility on pneumatic otoscopy. Nasal: External nose without lesions. Septum midline with mild rhinitis.. Clear nasal passages otherwise. Oral: Lips and gums without lesions. Tongue and palate mucosa without lesions. Posterior oropharynx clear. Neck: No palpable adenopathy or masses Respiratory: Breathing comfortably  Skin: No facial/neck lesions or rash noted.  Audiogram today demonstrated mild lower frequency hearing loss in the left ear more so than the right ear.  SRT's were 5 dB on the right and 10 dB on the left.  She had type A tympanograms bilaterally.  Procedures  Assessment: No middle ear space abnormalities noted.  Symptoms are most likely inner ear or central in nature.  Possible Mnire's variant.  Possible vascular problem as she does have history of migraines.  Plan: Reviewed with her concerning mild low-frequency hearing loss in  the left ear compared to the right and suggested trying a low-salt diet and also placed her on Dyazide once a day in the a.m. for the next couple months to see if this improves her symptoms.   Narda Bonds, MD   CC:

## 2020-01-06 ENCOUNTER — Other Ambulatory Visit: Payer: Self-pay | Admitting: Primary Care

## 2020-01-06 DIAGNOSIS — M797 Fibromyalgia: Secondary | ICD-10-CM

## 2020-01-09 ENCOUNTER — Encounter (INDEPENDENT_AMBULATORY_CARE_PROVIDER_SITE_OTHER): Payer: Self-pay

## 2020-01-12 ENCOUNTER — Encounter (INDEPENDENT_AMBULATORY_CARE_PROVIDER_SITE_OTHER): Payer: Self-pay

## 2020-04-10 ENCOUNTER — Ambulatory Visit: Payer: BC Managed Care – PPO | Admitting: Primary Care

## 2020-04-10 ENCOUNTER — Encounter: Payer: Self-pay | Admitting: Primary Care

## 2020-04-10 ENCOUNTER — Other Ambulatory Visit: Payer: Self-pay

## 2020-04-10 VITALS — BP 110/74 | HR 102 | Temp 98.3°F | Ht 61.5 in | Wt 150.0 lb

## 2020-04-10 DIAGNOSIS — M797 Fibromyalgia: Secondary | ICD-10-CM | POA: Diagnosis not present

## 2020-04-10 DIAGNOSIS — R81 Glycosuria: Secondary | ICD-10-CM | POA: Diagnosis not present

## 2020-04-10 DIAGNOSIS — Z8632 Personal history of gestational diabetes: Secondary | ICD-10-CM | POA: Diagnosis not present

## 2020-04-10 DIAGNOSIS — E119 Type 2 diabetes mellitus without complications: Secondary | ICD-10-CM | POA: Insufficient documentation

## 2020-04-10 DIAGNOSIS — R7303 Prediabetes: Secondary | ICD-10-CM | POA: Insufficient documentation

## 2020-04-10 DIAGNOSIS — R350 Frequency of micturition: Secondary | ICD-10-CM

## 2020-04-10 DIAGNOSIS — E1165 Type 2 diabetes mellitus with hyperglycemia: Secondary | ICD-10-CM | POA: Insufficient documentation

## 2020-04-10 HISTORY — DX: Glycosuria: R81

## 2020-04-10 LAB — POCT GLYCOSYLATED HEMOGLOBIN (HGB A1C): Hemoglobin A1C: 5.7 % — AB (ref 4.0–5.6)

## 2020-04-10 LAB — POC URINALSYSI DIPSTICK (AUTOMATED)
Bilirubin, UA: NEGATIVE
Blood, UA: NEGATIVE
Glucose, UA: POSITIVE — AB
Ketones, UA: NEGATIVE
Leukocytes, UA: NEGATIVE
Nitrite, UA: NEGATIVE
Protein, UA: NEGATIVE
Spec Grav, UA: 1.02 (ref 1.010–1.025)
Urobilinogen, UA: 0.2 E.U./dL
pH, UA: 5.5 (ref 5.0–8.0)

## 2020-04-10 MED ORDER — DULOXETINE HCL 30 MG PO CPEP: ORAL_CAPSULE | ORAL | 3 refills | Status: DC

## 2020-04-10 NOTE — Patient Instructions (Signed)
Your blood sugars are in the prediabetic range.  Work on weight loss through diet and exercise.  Please schedule a follow up appointment in 3 months for complete physical with labs.  It was a pleasure to see you today!   Diabetes Care, 44(Suppl 1), W58-K99. https://doi.org/https://doi.org/10.2337/dc21-[email or web address]">  Prediabetes Prediabetes is when your blood sugar (blood glucose) level is higher than normal but not high enough for you to be diagnosed with type 2 diabetes. Having prediabetes puts you at risk for developing type 2 diabetes (type 2 diabetes mellitus). With certain lifestyle changes, you may be able to prevent or delay the onset of type 2 diabetes. This is important because type 2 diabetes can lead to serious complications, such as:  Heart disease.  Stroke.  Blindness.  Kidney disease.  Depression.  Poor circulation in the feet and legs. In severe cases, this could lead to surgical removal of a leg (amputation). What are the causes? The exact cause of prediabetes is not known. It may result from insulin resistance. Insulin resistance develops when cells in the body do not respond properly to insulin that the body makes. This can cause excess glucose to build up in the blood. High blood glucose (hyperglycemia) can develop. What increases the risk? The following factors may make you more likely to develop this condition:  You have a family member with type 2 diabetes.  You are older than 45 years.  You had a temporary form of diabetes during a pregnancy (gestational diabetes).  You had polycystic ovary syndrome (PCOS).  You are overweight or obese.  You are inactive (sedentary).  You have a history of heart disease, including problems with cholesterol levels, high levels of blood fats, or high blood pressure. What are the signs or symptoms? You may have no symptoms. If you do have symptoms, they may include:  Increased hunger.  Increased thirst.  Increased  urination.  Vision changes, such as blurry vision.  Tiredness (fatigue). How is this diagnosed? This condition can be diagnosed with blood tests. Your blood glucose may be checked with one or more of the following tests:  A fasting blood glucose (FBG) test. You will not be allowed to eat (you will fast) for at least 8 hours before a blood sample is taken.  An A1C blood test (hemoglobin A1C). This test provides information about blood glucose levels over the previous 2?3 months.  An oral glucose tolerance test (OGTT). This test measures your blood glucose at two points in time: ? After fasting. This is your baseline level. ? Two hours after you drink a beverage that contains glucose. You may be diagnosed with prediabetes if:  Your FBG is 100?125 mg/dL (8.3-3.8 mmol/L).  Your A1C level is 5.7?6.4% (39-46 mmol/mol).  Your OGTT result is 140?199 mg/dL (2.5-05 mmol/L). These blood tests may be repeated to confirm your diagnosis.   How is this treated? Treatment may include dietary and lifestyle changes to help lower your blood glucose and prevent type 2 diabetes from developing. In some cases, medicine may be prescribed to help lower the risk of type 2 diabetes. Follow these instructions at home: Nutrition  Follow a healthy meal plan. This includes eating lean proteins, whole grains, legumes, fresh fruits and vegetables, low-fat dairy products, and healthy fats.  Follow instructions from your health care provider about eating or drinking restrictions.  Meet with a dietitian to create a healthy eating plan that is right for you.   Lifestyle  Do moderate-intensity exercise for at least 30  minutes a day on 5 or more days each week, or as told by your health care provider. A mix of activities may be best, such as: ? Brisk walking, swimming, biking, and weight lifting.  Lose weight as told by your health care provider. Losing 5-7% of your body weight can reverse insulin resistance.  Do  not drink alcohol if: ? Your health care provider tells you not to drink. ? You are pregnant, may be pregnant, or are planning to become pregnant.  If you drink alcohol: ? Limit how much you use to:  0-1 drink a day for women.  0-2 drinks a day for men. ? Be aware of how much alcohol is in your drink. In the [place] one drink equals one 12 oz bottle of beer (355 mL), one 5 oz glass of wine (148 mL), or one 1 oz glass of hard liquor (44 mL). General instructions  Take over-the-counter and prescription medicines only as told by your health care provider. You may be prescribed medicines that help lower the risk of type 2 diabetes.  Do not use any products that contain nicotine or tobacco, such as cigarettes, e-cigarettes, and chewing tobacco. If you need help quitting, ask your health care provider.  Keep all follow-up visits. This is important. Where to find more information  American Diabetes Association: www.diabetes.org  Academy of Nutrition and Dietetics: www.eatright.org  American Heart Association: www.heart.org Contact a health care provider if:  You have any of these symptoms: ? Increased hunger. ? Increased urination. ? Increased thirst. ? Fatigue. ? Vision changes, such as blurry vision. Get help right away if you:  Have shortness of breath.  Feel confused.  Vomit or feel like you may vomit. Summary  Prediabetes is when your blood sugar (blood glucose)level is higher than normal but not high enough for you to be diagnosed with type 2 diabetes.  Having prediabetes puts you at risk for developing type 2 diabetes (type 2 diabetes mellitus).  Make lifestyle changes such as eating a healthy diet and exercising regularly to help prevent diabetes. Lose weight as told by your health care provider. This information is not intended to replace advice given to you by your health care provider. Make sure you discuss any questions you have with your health care  provider. Document Revised: 05/18/2019 Document Reviewed: 05/18/2019 Elsevier Patient Education  2021 [employer].

## 2020-04-10 NOTE — Assessment & Plan Note (Signed)
Improved and doing well on Cymbalta 30 mg, continue same. Refills provided today.

## 2020-04-10 NOTE — Assessment & Plan Note (Signed)
Noted during recent DOT physical.  UA today with trace glucose. History of gestational diabetes.  A1C today of 5.7, discussed this with patient today. She will work on lifestyle changes. We will plan to see her back in 3 months for CPE and updated labs.

## 2020-04-10 NOTE — Assessment & Plan Note (Signed)
A1C of 5.7 today, discussed this with patient today.  History of gestational diabetes x 2.   She will work on lifestyle changes, we will see her back in 3 months for follow up and repeat labs.

## 2020-04-10 NOTE — Progress Notes (Signed)
Subjective:    Patient ID: Holly Farley, female    DOB: [birth year], 40 y.o.   MRN: [id number]  HPI  This visit occurred during the SARS-CoV-2 public health emergency.  Safety protocols were in place, including screening questions prior to the visit, additional usage of staff PPE, and extensive cleaning of exam room while observing appropriate contact time as indicated for disinfecting solutions.   Ms. Yost is a 40 year old female with a history of PCOS, gastric bypass, gestational diabetes,  fibromyalgia who presets today with a chief complaint of glucosuria. She is also needing a refill of her Cymbalta.   She underwent a DOT physical one week ago and was told that she had "glucosuria". She was menstruating at the time so she also had blood. She denies hematuria, dysuria, low back pain, pelvic pain.   She had gestational diabetes during both pregnancies, no problems with glucose or diabetes since bariatric surgery five years ago.   She is doing very well with her Cymbalta 30 mg for chronic pain and fibromyalgia. She is also seeing a chiropractor which has helped. She is needing refills today.   BP Readings from Last 3 Encounters:  04/10/20 110/74  11/09/19 100/78  07/06/19 100/68   Wt Readings from Last 3 Encounters:  04/10/20 150 lb (68 kg)  11/09/19 150 lb (68 kg)  07/06/19 146 lb 8 oz (66.5 kg)     Review of Systems  Eyes: Negative for visual disturbance.  Respiratory: Negative for shortness of breath.   Cardiovascular: Negative for chest pain.  Genitourinary:       Chronic urinary frequency  Musculoskeletal: Positive for arthralgias and myalgias.       Improved pain on Cymbalta 30 mg and chiropractor visits        Past Medical History:  Diagnosis Date  . Acute bilateral thoracic back pain 03/03/2018  . Diabetes mellitus without complication (HCC)   . Fever and chills 07/04/2018  . History of diet-controlled diabetes 01/13/2018  . Insulin resistance   . Migraines    . PCOS (polycystic ovarian syndrome)    with infertility, followed by [facility]  . Syncope 05/16/2018  . Weight loss, unintentional 01/13/2018     Social History   Socioeconomic History  . Marital status: Married    Spouse name: Not on file  . Number of children: 2  . Years of education: Not on file  . Highest education level: Bachelor's degree (e.g., BA, AB, BS)  Occupational History  . Not on file  Tobacco Use  . Smoking status: Former Smoker    Packs/day: 0.75    Years: 7.00    Pack years: 5.25    Start date: 2002    Quit date: 06/06/2007    Years since quitting: 12.8  . Smokeless tobacco: Never Used  Substance and Sexual Activity  . Alcohol use: No  . Drug use: No  . Sexual activity: Yes    Birth control/protection: Surgical    Comment: tubal ligation  Other Topics Concern  . Not on file  Social History Narrative   Caffeine- 300 mg daily   Social Determinants of Health   Financial Resource Strain: Not on file  Food Insecurity: Not on file  Transportation Needs: Not on file  Physical Activity: Not on file  Stress: Not on file  Social Connections: Not on file  Intimate Partner Violence: Not on file    Past Surgical History:  Procedure Laterality Date  . CESAREAN SECTION    .  ESOPHAGOGASTRODUODENOSCOPY    . GASTRIC ROUX-EN-Y N/A 09/24/2014   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;  Surgeon: Luretha Murphy, MD;  Location: [facility];  Service: General;  Laterality: N/A;  . TUBAL LIGATION      Family History  Problem Relation Age of Onset  . Bipolar disorder Mother   . Migraines Mother   . Bipolar disorder Brother   . Diabetes Other   . Hypertrophic cardiomyopathy Paternal Grandmother 22  . Hypertrophic cardiomyopathy Cousin 31    No Known Allergies  Current Outpatient Medications on File Prior to Visit  Medication Sig Dispense Refill  . azelastine (ASTELIN) 0.1 % nasal spray USE 1 SPRAY IN BOTH NOSRILS TWICE DAILY 30 mL 0  .  [name] Take 1 tablet by mouth 3 (three) times daily.     . Clobetasol Propionate 0.05 % shampoo Massage into scalp twice daily. 118 mL 0  . Cyanocobalamin ([name]) 2500 MCG SUBL Place 2,500 mcg under the tongue daily.    Marland Kitchen doxycycline (VIBRA-TABS) 100 MG tablet Take 1 tablet (100 mg total) by mouth 2 (two) times daily. 14 tablet 0  . Multiple Vitamin (MULTIVITAMIN WITH MINERALS) TABS tablet Take 1 tablet by mouth daily.    . ondansetron (ZOFRAN ODT) 4 MG disintegrating tablet Take 1 tablet (4 mg total) by mouth 2 (two) times daily as needed for nausea or vomiting. 15 tablet 0  . predniSONE (DELTASONE) 20 MG tablet Take 2 tablets by mouth once daily for five days. 10 tablet 0   No current facility-administered medications on file prior to visit.    BP 110/74   Pulse (!) 102   Temp 98.3 F (36.8 C) (Temporal)   Ht 5' 1.5" (1.562 m)   Wt 150 lb (68 kg)   SpO2 100%   BMI 27.88 kg/m    Objective:   Physical Exam Constitutional:      Appearance: She is well-nourished.  Cardiovascular:     Rate and Rhythm: Normal rate and regular rhythm.  Pulmonary:     Effort: Pulmonary effort is normal.     Breath sounds: Normal breath sounds.  Musculoskeletal:     Cervical back: Neck supple.  Skin:    General: Skin is warm and dry.  Psychiatric:        Mood and Affect: Mood and affect normal.            Assessment & Plan:

## 2020-06-04 ENCOUNTER — Other Ambulatory Visit: Payer: Self-pay

## 2020-06-04 ENCOUNTER — Telehealth (INDEPENDENT_AMBULATORY_CARE_PROVIDER_SITE_OTHER): Payer: BC Managed Care – PPO | Admitting: Family Medicine

## 2020-06-04 ENCOUNTER — Encounter: Payer: Self-pay | Admitting: Family Medicine

## 2020-06-04 DIAGNOSIS — J301 Allergic rhinitis due to pollen: Secondary | ICD-10-CM | POA: Diagnosis not present

## 2020-06-04 MED ORDER — PREDNISONE 20 MG PO TABS: 40.0000 mg | ORAL_TABLET | Freq: Every day | ORAL | 0 refills | Status: AC

## 2020-06-04 MED ORDER — FLUTICASONE PROPIONATE 50 MCG/ACT NA SUSP: 2.0000 | Freq: Every day | NASAL | 6 refills | Status: DC

## 2020-06-04 NOTE — Progress Notes (Signed)
Chief Complaint  Patient presents with  . Headache    Congestion   . Sinusitis  . Fatigue  . Chills    [name] here for URI complaints. Due to COVID-19 pandemic, we are interacting via web portal for an electronic face-to-face visit. I verified patient's ID using 2 identifiers. Patient agreed to proceed with visit via this method. Patient is at work, I am at office. Patient and I are present for visit.   Duration: 2 days  Associated symptoms: sinus headache, sinus congestion, rhinorrhea, itchy watery eyes and fatigue Denies: sinus pain, itchy watery eyes, ear pain, ear drainage, sore throat, wheezing, shortness of breath, myalgia and fevers, coughing Treatment to date: Allegra D, Astelin nasal spray, Tylenol Sick contacts: Yes  Past Medical History:  Diagnosis Date  . Acute bilateral thoracic back pain 03/03/2018  . Diabetes mellitus without complication (HCC)   . Fever and chills 07/04/2018  . History of diet-controlled diabetes 01/13/2018  . Insulin resistance   . Migraines   . PCOS (polycystic ovarian syndrome)    with infertility, followed by [facility]  . Syncope 05/16/2018  . Weight loss, unintentional 01/13/2018   Objective No conversational dyspnea Age appropriate judgment and insight Nml affect and mood  Seasonal allergic rhinitis due to pollen - Plan: predniSONE (DELTASONE) 20 MG tablet, fluticasone (FLONASE) 50 MCG/ACT nasal spray  5 d pred burst 40 mg/d, start Flonase for whooshing in ear. Send message next week if no better, sooner if new symptoms arise.  Continue to push fluids, practice good hand hygiene, cover mouth when coughing. F/u prn. If starting to experience fevers, shaking, or shortness of breath, seek immediate care. Pt voiced understanding and agreement to the plan.  Jilda Roche [place], DO 06/04/20 1:56 PM

## 2020-07-09 ENCOUNTER — Ambulatory Visit (INDEPENDENT_AMBULATORY_CARE_PROVIDER_SITE_OTHER): Payer: BC Managed Care – PPO | Admitting: Primary Care

## 2020-07-09 ENCOUNTER — Other Ambulatory Visit (HOSPITAL_COMMUNITY)
Admission: RE | Admit: 2020-07-09 | Discharge: 2020-07-09 | Disposition: A | Discharge status: Home / Self Care | Payer: BC Managed Care – PPO | Source: Ambulatory Visit | Attending: Primary Care | Admitting: Primary Care

## 2020-07-09 ENCOUNTER — Other Ambulatory Visit: Payer: Self-pay

## 2020-07-09 ENCOUNTER — Encounter: Payer: Self-pay | Admitting: Primary Care

## 2020-07-09 VITALS — BP 110/72 | HR 100 | Temp 98.6°F | Ht 61.5 in | Wt 147.0 lb

## 2020-07-09 DIAGNOSIS — Z0000 Encounter for general adult medical examination without abnormal findings: Secondary | ICD-10-CM | POA: Diagnosis not present

## 2020-07-09 DIAGNOSIS — Z124 Encounter for screening for malignant neoplasm of cervix: Secondary | ICD-10-CM

## 2020-07-09 DIAGNOSIS — M797 Fibromyalgia: Secondary | ICD-10-CM | POA: Diagnosis not present

## 2020-07-09 DIAGNOSIS — G43709 Chronic migraine without aura, not intractable, without status migrainosus: Secondary | ICD-10-CM

## 2020-07-09 DIAGNOSIS — R7303 Prediabetes: Secondary | ICD-10-CM

## 2020-07-09 DIAGNOSIS — Z1159 Encounter for screening for other viral diseases: Secondary | ICD-10-CM

## 2020-07-09 LAB — COMPREHENSIVE METABOLIC PANEL
ALT: 21 U/L (ref 0–35)
AST: 22 U/L (ref 0–37)
Albumin: 4.8 g/dL (ref 3.5–5.2)
Alkaline Phosphatase: 50 U/L (ref 39–117)
BUN: 10 mg/dL (ref 6–23)
CO2: 29 mEq/L (ref 19–32)
Calcium: 9.8 mg/dL (ref 8.4–10.5)
Chloride: 100 mEq/L (ref 96–112)
Creatinine, Ser: 0.8 mg/dL (ref 0.40–1.20)
GFR: 93.61 mL/min (ref >60.00)
Glucose, Bld: 147 mg/dL — ABNORMAL HIGH (ref 70–99)
Potassium: 4.3 mEq/L (ref 3.5–5.1)
Sodium: 135 mEq/L (ref 135–145)
Total Bilirubin: 0.5 mg/dL (ref 0.2–1.2)
Total Protein: 7.7 g/dL (ref 6.0–8.3)

## 2020-07-09 LAB — CBC
HCT: 30.1 % — ABNORMAL LOW (ref 36.0–46.0)
Hemoglobin: 9.2 g/dL — ABNORMAL LOW (ref 12.0–15.0)
MCHC: 30.7 g/dL (ref 30.0–36.0)
MCV: 62.9 fl — ABNORMAL LOW (ref 78.0–100.0)
Platelets: 379 10*3/uL (ref 150.0–400.0)
RBC: 4.78 Mil/uL (ref 3.87–5.11)
RDW: 17.3 % — ABNORMAL HIGH (ref 11.5–15.5)
WBC: 5.3 10*3/uL (ref 4.0–10.5)

## 2020-07-09 LAB — LIPID PANEL
Cholesterol: 160 mg/dL (ref 0–200)
HDL: 75.1 mg/dL (ref >39.00)
LDL Cholesterol: 70 mg/dL (ref 0–99)
NonHDL: 84.87
Total CHOL/HDL Ratio: 2
Triglycerides: 74 mg/dL (ref 0.0–149.0)
VLDL: 14.8 mg/dL (ref 0.0–40.0)

## 2020-07-09 LAB — HEMOGLOBIN A1C: Hgb A1c MFr Bld: 6.6 % — ABNORMAL HIGH (ref 4.6–6.5)

## 2020-07-09 MED ORDER — CYCLOBENZAPRINE HCL 5 MG PO TABS: 5.0000 mg | ORAL_TABLET | Freq: Every evening | ORAL | 0 refills | Status: DC | PRN

## 2020-07-09 NOTE — Progress Notes (Signed)
Subjective:    Patient ID: Holly Farley, female    DOB: 21-May-1980, 40 y.o.   MRN: [id number]  HPI  Holly Farley is a very pleasant 40 y.o. female who presents today for complete physical.  Immunizations: -Tetanus: 2019 -Influenza: Did not complete this last season  -Covid-19: Completed 2 vaccines  Diet: She endorses a healthy diet.  Exercise: She is not exercising  Eye exam: No recent exam Dental exam: Completes semi-annually   Pap Smear: 2017  BP Readings from Last 3 Encounters:  07/09/20 110/72  04/10/20 110/74  11/09/19 100/78       Review of Systems  Constitutional: Negative for unexpected weight change.  HENT: Negative for rhinorrhea.   Eyes: Negative for visual disturbance.  Respiratory: Negative for cough and shortness of breath.   Cardiovascular: Negative for chest pain.  Gastrointestinal: Negative for constipation and diarrhea.  Genitourinary: Negative for difficulty urinating and menstrual problem.  Musculoskeletal: Positive for arthralgias and myalgias.  Skin: Negative for rash.  Allergic/Immunologic: Negative for environmental allergies.  Neurological: Positive for headaches. Negative for dizziness.  Psychiatric/Behavioral: The patient is not nervous/anxious.          Past Medical History:  Diagnosis Date  . Acute bilateral thoracic back pain 03/03/2018  . Diabetes mellitus without complication (HCC)   . Fever and chills 07/04/2018  . History of diet-controlled diabetes 01/13/2018  . Insulin resistance   . Migraines   . PCOS (polycystic ovarian syndrome)    with infertility, followed by [facility]  . Syncope 05/16/2018  . Weight loss, unintentional 01/13/2018    Social History   Socioeconomic History  . Marital status: Married    Spouse name: Not on file  . Number of children: 2  . Years of education: Not on file  . Highest education level: Bachelor's degree (e.g., BA, AB, BS)  Occupational History  . Not on file  Tobacco Use  . Smoking  status: Former Smoker    Packs/day: 0.75    Years: 7.00    Pack years: 5.25    Start date: 2002    Quit date: 06/06/2007    Years since quitting: 13.1  . Smokeless tobacco: Never Used  Substance and Sexual Activity  . Alcohol use: No  . Drug use: No  . Sexual activity: Yes    Birth control/protection: Surgical    Comment: tubal ligation  Other Topics Concern  . Not on file  Social History Narrative   Caffeine- 300 mg daily   Social Determinants of Health   Financial Resource Strain: Not on file  Food Insecurity: Not on file  Transportation Needs: Not on file  Physical Activity: Not on file  Stress: Not on file  Social Connections: Not on file  Intimate Partner Violence: Not on file    Past Surgical History:  Procedure Laterality Date  . CESAREAN SECTION    . ESOPHAGOGASTRODUODENOSCOPY    . GASTRIC ROUX-EN-Y N/A 09/24/2014   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;  Surgeon: Luretha Murphy, MD;  Location: [facility];  Service: General;  Laterality: N/A;  . TUBAL LIGATION      Family History  Problem Relation Age of Onset  . Bipolar disorder Mother   . Migraines Mother   . Bipolar disorder Brother   . Diabetes Other   . Hypertrophic cardiomyopathy Paternal Grandmother 28  . Hypertrophic cardiomyopathy Cousin 31    No Known Allergies  Current Outpatient Medications on File Prior to Visit  Medication Sig  Dispense Refill  . azelastine (ASTELIN) 0.1 % nasal spray USE 1 SPRAY IN BOTH NOSRILS TWICE DAILY 30 mL 0  . [name] Take 1 tablet by mouth 3 (three) times daily.     . Clobetasol Propionate 0.05 % shampoo Massage into scalp twice daily. 118 mL 0  . Cyanocobalamin ([name]) 2500 MCG SUBL Place 2,500 mcg under the tongue daily.    . DULoxetine (CYMBALTA) 30 MG capsule TAKE 1 CAPSULE BY MOUTH DAILY FOR PAIN. 90 capsule 3  . fluticasone (FLONASE) 50 MCG/ACT nasal spray Place 2 sprays into both nostrils  daily. 16 g 6  . Multiple Vitamin (MULTIVITAMIN WITH MINERALS) TABS tablet Take 1 tablet by mouth daily.     No current facility-administered medications on file prior to visit.    There were no vitals taken for this visit. Objective:   Physical Exam HENT:     Right Ear: Tympanic membrane and ear canal normal.     Left Ear: Tympanic membrane and ear canal normal.     Nose: Nose normal.  Eyes:     Conjunctiva/sclera: Conjunctivae normal.     Pupils: Pupils are equal, round, and reactive to light.  Neck:     Thyroid: No thyromegaly.  Cardiovascular:     Rate and Rhythm: Normal rate and regular rhythm.     Heart sounds: No murmur heard.   Pulmonary:     Effort: Pulmonary effort is normal.     Breath sounds: Normal breath sounds. No rales.  Abdominal:     General: Bowel sounds are normal.     Palpations: Abdomen is soft.     Tenderness: There is no abdominal tenderness.  Musculoskeletal:        General: Normal range of motion.     Cervical back: Neck supple.  Lymphadenopathy:     Cervical: No cervical adenopathy.  Skin:    General: Skin is warm and dry.     Findings: No rash.  Neurological:     Mental Status: She is alert and oriented to person, place, and time.     Cranial Nerves: No cranial nerve deficit.     Deep Tendon Reflexes: Reflexes are normal and symmetric.  Psychiatric:        Mood and Affect: Mood normal.           Assessment & Plan:      This visit occurred during the SARS-CoV-2 public health emergency.  Safety protocols were in place, including screening questions prior to the visit, additional usage of staff PPE, and extensive cleaning of exam room while observing appropriate contact time as indicated for disinfecting solutions.

## 2020-07-09 NOTE — Assessment & Plan Note (Signed)
Repeat A1C pending. 

## 2020-07-09 NOTE — Assessment & Plan Note (Signed)
Worse over the last 1-2 months, suspects this is secondary to allergy/sinus symptoms.   She will update if migraines persist past Spring season. She will resume Astelin nasal spray and update.

## 2020-07-09 NOTE — Patient Instructions (Signed)
Stop by the lab prior to leaving today. I will notify you of your results once received.   You may take the Flexeril as needed at bedtime for pain.  It was a pleasure to see you today!

## 2020-07-09 NOTE — Assessment & Plan Note (Addendum)
Doing well on cyclobenzaprine 5 mg for which she took several times week on average with improvement. Also doing well Cymbalta 30 mg, continue same.   Refills provided today.

## 2020-07-09 NOTE — Assessment & Plan Note (Signed)
Immunizations UTD. Pap smear due today, completed.  Discussed the importance of a healthy diet and regular exercise in order for weight loss, and to reduce the risk of any potential medical problems.  Exam today stable. Labs pending.

## 2020-07-10 DIAGNOSIS — N920 Excessive and frequent menstruation with regular cycle: Secondary | ICD-10-CM

## 2020-07-10 DIAGNOSIS — D649 Anemia, unspecified: Secondary | ICD-10-CM

## 2020-07-10 DIAGNOSIS — E119 Type 2 diabetes mellitus without complications: Secondary | ICD-10-CM

## 2020-07-10 LAB — HEPATITIS C ANTIBODY
Hepatitis C Ab: NONREACTIVE
SIGNAL TO CUT-OFF: 0.01 (ref <1.00)

## 2020-07-11 LAB — CYTOLOGY - PAP
Comment: NEGATIVE
Diagnosis: UNDETERMINED — AB
High risk HPV: NEGATIVE

## 2020-07-11 MED ORDER — METFORMIN HCL 500 MG PO TABS: 500.0000 mg | ORAL_TABLET | Freq: Two times a day (BID) | ORAL | 1 refills | Status: DC

## 2020-08-14 ENCOUNTER — Other Ambulatory Visit: Payer: Self-pay

## 2020-08-14 ENCOUNTER — Ambulatory Visit
Admission: RE | Admit: 2020-08-14 | Discharge: 2020-08-14 | Disposition: A | Discharge status: Home / Self Care | Payer: BC Managed Care – PPO | Source: Ambulatory Visit | Attending: Primary Care | Admitting: Primary Care

## 2020-08-14 DIAGNOSIS — D649 Anemia, unspecified: Secondary | ICD-10-CM | POA: Insufficient documentation

## 2020-08-14 DIAGNOSIS — N920 Excessive and frequent menstruation with regular cycle: Secondary | ICD-10-CM | POA: Insufficient documentation

## 2020-08-15 DIAGNOSIS — D649 Anemia, unspecified: Secondary | ICD-10-CM

## 2020-08-27 ENCOUNTER — Other Ambulatory Visit (INDEPENDENT_AMBULATORY_CARE_PROVIDER_SITE_OTHER): Payer: BC Managed Care – PPO

## 2020-08-27 ENCOUNTER — Other Ambulatory Visit: Payer: Self-pay

## 2020-08-27 DIAGNOSIS — D649 Anemia, unspecified: Secondary | ICD-10-CM | POA: Diagnosis not present

## 2020-08-27 LAB — CBC WITH DIFFERENTIAL/PLATELET
Basophils Absolute: 0 10*3/uL (ref 0.0–0.1)
Basophils Relative: 0.4 % (ref 0.0–3.0)
Eosinophils Absolute: 0.1 10*3/uL (ref 0.0–0.7)
Eosinophils Relative: 0.8 % (ref 0.0–5.0)
HCT: 37.4 % (ref 36.0–46.0)
Hemoglobin: 12 g/dL (ref 12.0–15.0)
Lymphocytes Relative: 21.8 % (ref 12.0–46.0)
Lymphs Abs: 1.8 10*3/uL (ref 0.7–4.0)
MCHC: 32.1 g/dL (ref 30.0–36.0)
MCV: 75.6 fl — ABNORMAL LOW (ref 78.0–100.0)
Monocytes Absolute: 0.4 10*3/uL (ref 0.1–1.0)
Monocytes Relative: 4.3 % (ref 3.0–12.0)
Neutro Abs: 6 10*3/uL (ref 1.4–7.7)
Neutrophils Relative %: 72.7 % (ref 43.0–77.0)
Platelets: 262 10*3/uL (ref 150.0–400.0)
RBC: 4.94 Mil/uL (ref 3.87–5.11)
RDW: 27.9 % — ABNORMAL HIGH (ref 11.5–15.5)
WBC: 8.2 10*3/uL (ref 4.0–10.5)

## 2020-08-27 LAB — IBC + FERRITIN
Ferritin: 6.3 ng/mL — ABNORMAL LOW (ref 10.0–291.0)
Iron: 33 ug/dL — ABNORMAL LOW (ref 42–145)
Saturation Ratios: 7.5 % — ABNORMAL LOW (ref 20.0–50.0)
Transferrin: 316 mg/dL (ref 212.0–360.0)

## 2020-09-27 ENCOUNTER — Telehealth: Payer: Self-pay | Admitting: Primary Care

## 2020-09-27 DIAGNOSIS — N83202 Unspecified ovarian cyst, left side: Secondary | ICD-10-CM

## 2020-09-27 DIAGNOSIS — N920 Excessive and frequent menstruation with regular cycle: Secondary | ICD-10-CM

## 2020-09-27 NOTE — Telephone Encounter (Signed)
Noted, orders placed. 

## 2020-09-27 NOTE — Telephone Encounter (Signed)
Patient states that it is okay to proceed with repeat pelvic ultrasound.

## 2020-09-27 NOTE — Telephone Encounter (Addendum)
-----   Message from Doreene Nest, NP sent at 08/15/2020  4:55 PM EDT ----- Regarding: Repeat pelvic US  Patient is due for repeat pelvic ultrasound due to hemorrhagic cyst on left ovary that was found in mid June 2022.  Is she okay to move forward with repeating the ultrasound?

## 2020-10-11 ENCOUNTER — Other Ambulatory Visit: Payer: Self-pay | Admitting: Primary Care

## 2020-10-11 DIAGNOSIS — E119 Type 2 diabetes mellitus without complications: Secondary | ICD-10-CM

## 2020-10-11 NOTE — Telephone Encounter (Signed)
Patient has follow up at end of month. One refill has been called in.

## 2020-10-22 ENCOUNTER — Ambulatory Visit: Payer: BC Managed Care – PPO | Admitting: Primary Care

## 2020-10-23 LAB — HM DIABETES EYE EXAM

## 2020-10-29 ENCOUNTER — Ambulatory Visit: Payer: BC Managed Care – PPO

## 2020-10-30 ENCOUNTER — Other Ambulatory Visit: Payer: Self-pay

## 2020-10-30 ENCOUNTER — Ambulatory Visit
Admission: RE | Admit: 2020-10-30 | Discharge: 2020-10-30 | Disposition: A | Discharge status: Home / Self Care | Payer: BC Managed Care – PPO | Source: Ambulatory Visit | Attending: Primary Care | Admitting: Primary Care

## 2020-10-30 DIAGNOSIS — N83202 Unspecified ovarian cyst, left side: Secondary | ICD-10-CM | POA: Diagnosis present

## 2020-10-30 DIAGNOSIS — N920 Excessive and frequent menstruation with regular cycle: Secondary | ICD-10-CM | POA: Insufficient documentation

## 2020-10-31 ENCOUNTER — Other Ambulatory Visit: Payer: Self-pay

## 2020-10-31 ENCOUNTER — Ambulatory Visit: Payer: BC Managed Care – PPO | Admitting: Primary Care

## 2020-10-31 ENCOUNTER — Encounter: Payer: Self-pay | Admitting: Primary Care

## 2020-10-31 VITALS — BP 110/76 | HR 76 | Temp 98.7°F | Ht 61.5 in | Wt 154.0 lb

## 2020-10-31 DIAGNOSIS — E282 Polycystic ovarian syndrome: Secondary | ICD-10-CM | POA: Diagnosis not present

## 2020-10-31 DIAGNOSIS — E119 Type 2 diabetes mellitus without complications: Secondary | ICD-10-CM | POA: Diagnosis not present

## 2020-10-31 DIAGNOSIS — Z23 Encounter for immunization: Secondary | ICD-10-CM

## 2020-10-31 LAB — POCT GLYCOSYLATED HEMOGLOBIN (HGB A1C): Hemoglobin A1C: 5.8 % — AB (ref 4.0–5.6)

## 2020-10-31 MED ORDER — METFORMIN HCL ER 500 MG PO TB24: 500.0000 mg | ORAL_TABLET | Freq: Every day | ORAL | 3 refills | Status: DC

## 2020-10-31 NOTE — Assessment & Plan Note (Signed)
Repeat pelvic ultrasound with resolving left hemorrhagic cyst.

## 2020-10-31 NOTE — Assessment & Plan Note (Signed)
Improved with A1C of 5.8 from 6.6.   Metformin 500 mg BID is causing GI upset, she did well in the past with XR version.    Metformin XR 500 mg sent to pharmacy.  Foot exam today. She will schedule eye exam. Urine microalbumin due and pending.  Follow up in 6 months.

## 2020-10-31 NOTE — Progress Notes (Signed)
Subjective:    Patient ID: Holly Farley, female    DOB: 1980-09-13, 40 y.o.   MRN: [id number]  HPI  Holly Farley is a very pleasant 40 y.o. female with a history of type 2 diabetes, gastric bypass (2016), fibromyalgia, PCOS, who presents today for follow up of diabetes and to discuss transvaginal/pelvic ultrasound results.   Current medications include: Metformin 500 mg BID, causing GI upset daily. Has been managing. Was on XR version previously.   Last A1C: 6.6 in May 2022, 5.8 today Last Eye Exam: UTD Last Foot Exam: Due Pneumonia Vaccination: 2016 Urine Microalbumin: Due Statin: None.  Dietary changes since last visit: None   Exercise: None  She underwent transvaginal/pelvic ultrasound in 08/15/20 for symptoms of anemia with menorrhagia. A likely hemorrhagic cyst within the left ovary was noted, repeat ultrasound was noted. Repeat ultrasound completed yesterday which showed deceased size of complex left ovarian cyst, resolving hemorrhagic cyst.     Review of Systems  Respiratory:  Negative for shortness of breath.   Cardiovascular:  Negative for chest pain.  Neurological:        Denies numbness/tingling to feet        Past Medical History:  Diagnosis Date   Acute bilateral thoracic back pain 03/03/2018   Diabetes mellitus without complication (HCC)    Fever and chills 07/04/2018   History of diet-controlled diabetes 01/13/2018   Insulin resistance    Migraines    PCOS (polycystic ovarian syndrome)    with infertility, followed by [facility]   Syncope 05/16/2018   Weight loss, unintentional 01/13/2018    Social History   Socioeconomic History   Marital status: Married    Spouse name: Not on file   Number of children: 2   Years of education: Not on file   Highest education level: Bachelor's degree (e.g., BA, AB, BS)  Occupational History   Not on file  Tobacco Use   Smoking status: Former    Packs/day: 0.75    Years: 7.00    Pack years: 5.25    Types:  Cigarettes    Start date: 2002    Quit date: 06/06/2007    Years since quitting: 13.4   Smokeless tobacco: Never  Substance and Sexual Activity   Alcohol use: No   Drug use: No   Sexual activity: Yes    Birth control/protection: Surgical    Comment: tubal ligation  Other Topics Concern   Not on file  Social History Narrative   Caffeine- 300 mg daily   Social Determinants of Health   Financial Resource Strain: Not on file  Food Insecurity: Not on file  Transportation Needs: Not on file  Physical Activity: Not on file  Stress: Not on file  Social Connections: Not on file  Intimate Partner Violence: Not on file    Past Surgical History:  Procedure Laterality Date   CESAREAN SECTION     ESOPHAGOGASTRODUODENOSCOPY     GASTRIC ROUX-EN-Y N/A 09/24/2014   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;  Surgeon: Luretha Murphy, MD;  Location: [facility];  Service: General;  Laterality: N/A;   TUBAL LIGATION      Family History  Problem Relation Age of Onset   Bipolar disorder Mother    Migraines Mother    Bipolar disorder Brother    Diabetes Other    Hypertrophic cardiomyopathy Paternal Grandmother 57   Hypertrophic cardiomyopathy Cousin 31    No Known Allergies  Current Outpatient Medications on File Prior  to Visit  Medication Sig Dispense Refill   azelastine (ASTELIN) 0.1 % nasal spray USE 1 SPRAY IN BOTH NOSRILS TWICE DAILY 30 mL 0   [name] Take 1 tablet by mouth 3 (three) times daily.      Cyanocobalamin ([name]) 2500 MCG SUBL Place 2,500 mcg under the tongue daily.     cyclobenzaprine (FLEXERIL) 5 MG tablet Take 1 tablet (5 mg total) by mouth at bedtime as needed. For muscle spasms 90 tablet 0   DULoxetine (CYMBALTA) 30 MG capsule TAKE 1 CAPSULE BY MOUTH DAILY FOR PAIN. 90 capsule 3   fluticasone (FLONASE) 50 MCG/ACT nasal spray Place 2 sprays into both nostrils daily. 16 g 6   Multiple Vitamin  (MULTIVITAMIN WITH MINERALS) TABS tablet Take 1 tablet by mouth daily.     No current facility-administered medications on file prior to visit.    BP 110/76   Pulse 76   Temp 98.7 F (37.1 C) (Temporal)   Ht 5' 1.5" (1.562 m)   Wt 154 lb (69.9 kg)   SpO2 97%   BMI 28.63 kg/m  Objective:   Physical Exam Cardiovascular:     Rate and Rhythm: Normal rate and regular rhythm.  Pulmonary:     Effort: Pulmonary effort is normal.     Breath sounds: Normal breath sounds.  Musculoskeletal:     Cervical back: Neck supple.  Skin:    General: Skin is warm and dry.          Assessment & Plan:      This visit occurred during the SARS-CoV-2 public health emergency.  Safety protocols were in place, including screening questions prior to the visit, additional usage of staff PPE, and extensive cleaning of exam room while observing appropriate contact time as indicated for disinfecting solutions.

## 2020-10-31 NOTE — Patient Instructions (Addendum)
We changed your metformin to the XR version, take this once daily for diabetes.  Please schedule a physical to meet with me in May 2023.  It was a pleasure to see you today!       Influenza (Flu) Vaccine (Inactivated or Recombinant): What You Need to Know 1. Why get vaccinated? Influenza vaccine can prevent influenza (flu). Flu is a contagious disease that spreads around the [place] every year, usually between October and May. Anyone can get the flu, but it is more dangerous for some people. Infants and young children, people 87 years and older, pregnant people, and people with certain health conditions or a weakened immune system are at greatest risk of flu complications. Pneumonia, bronchitis, sinus infections, and ear infections are examples of flu-related complications. If you have a medical condition, such as heart disease, cancer, or diabetes, flu can make it worse. Flu can cause fever and chills, sore throat, muscle aches, fatigue, cough, headache, and runny or stuffy nose. Some people may have vomiting and diarrhea, though this is more common in children than adults. In an average year, thousands of people in the [place] die from flu, and many more are hospitalized. Flu vaccine prevents millions of illnesses and flu-related visits to the doctor each year. 2. Influenza vaccines CDC recommends everyone 6 months and older get vaccinated every flu season. Children 6 months through 47 years of age may need 2 doses during a single flu season. Everyone else needs only 1 dose each flu season. It takes about 2 weeks for protection to develop after vaccination. There are many flu viruses, and they are always changing. Each year a new flu vaccine is made to protect against the influenza viruses believed to be likely to cause disease in the upcoming flu season. Even when the vaccine doesn't exactly match these viruses, it may still provide some protection. Influenza vaccine does not  cause flu. Influenza vaccine may be given at the same time as other vaccines. 3. Talk with your health care provider Tell your vaccination provider if the person getting the vaccine: Has had an allergic reaction after a previous dose of influenza vaccine, or has any severe, life-threatening allergies Has ever had Guillain-Barr Syndrome (also called "GBS") In some cases, your health care provider may decide to postpone influenza vaccination until a future visit. Influenza vaccine can be administered at any time during pregnancy. People who are or will be pregnant during influenza season should receive inactivated influenza vaccine. People with minor illnesses, such as a cold, may be vaccinated. People who are moderately or severely ill should usually wait until they recover before getting influenza vaccine. Your health care provider can give you more information. 4. Risks of a vaccine reaction Soreness, redness, and swelling where the shot is given, fever, muscle aches, and headache can happen after influenza vaccination. There may be a very small increased risk of Guillain-Barr Syndrome (GBS) after inactivated influenza vaccine (the flu shot). Young children who get the flu shot along with pneumococcal vaccine (PCV13) and/or DTaP vaccine at the same time might be slightly more likely to have a seizure caused by fever. Tell your health care provider if a child who is getting flu vaccine has ever had a seizure. People sometimes faint after medical procedures, including vaccination. Tell your provider if you feel dizzy or have vision changes or ringing in the ears. As with any medicine, there is a very remote chance of a vaccine causing a severe allergic reaction, other serious  injury, or death. 5. What if there is a serious problem? An allergic reaction could occur after the vaccinated person leaves the clinic. If you see signs of a severe allergic reaction (hives, swelling of the face and throat,  difficulty breathing, a fast heartbeat, dizziness, or weakness), call 9-1-1 and get the person to the nearest hospital. For other signs that concern you, call your health care provider. Adverse reactions should be reported to the Vaccine Adverse Event Reporting System (VAERS). Your health care provider will usually file this report, or you can do it yourself. Visit the VAERS website at www.vaers.LAgents.no or call 602-663-0647. VAERS is only for reporting reactions, and VAERS staff members do not give medical advice. 6. The National Vaccine Injury Compensation Program The [employer] Vaccine Injury Compensation Program (VICP) is a federal program that was created to compensate people who may have been injured by certain vaccines. Claims regarding alleged injury or death due to vaccination have a time limit for filing, which may be as short as two years. Visit the VICP website at SpiritualWord.at or call 913-468-0285 to learn about the program and about filing a claim. 7. How can I learn more? Ask your health care provider. Call your local or state health department. Visit the website of the Food and Drug Administration (FDA) for vaccine package inserts and additional information at FinderList.no. Contact the Centers for Disease Control and Prevention (CDC): Call (574)208-6643 (1-800-CDC-INFO) or Visit CDC's website at BiotechRoom.com.cy. Vaccine Information Statement Inactivated Influenza Vaccine (10/06/2019) This information is not intended to replace advice given to you by your health care provider. Make sure you discuss any questions you have with your health care provider. Document Revised: 11/23/2019 Document Reviewed: 11/23/2019 Elsevier Patient Education  2022 [employer].

## 2020-11-01 DIAGNOSIS — Z23 Encounter for immunization: Secondary | ICD-10-CM

## 2020-11-01 LAB — MICROALBUMIN / CREATININE URINE RATIO
Creatinine,U: 148.5 mg/dL
Microalb Creat Ratio: 0.5 mg/g (ref 0.0–30.0)
Microalb, Ur: <0.7 mg/dL (ref 0.0–1.9)

## 2020-12-24 ENCOUNTER — Other Ambulatory Visit (INDEPENDENT_AMBULATORY_CARE_PROVIDER_SITE_OTHER): Payer: Self-pay | Admitting: Otolaryngology

## 2021-07-31 ENCOUNTER — Encounter: Payer: Self-pay | Admitting: Primary Care

## 2021-07-31 ENCOUNTER — Ambulatory Visit (INDEPENDENT_AMBULATORY_CARE_PROVIDER_SITE_OTHER): Payer: BC Managed Care – PPO | Admitting: Primary Care

## 2021-07-31 ENCOUNTER — Other Ambulatory Visit (HOSPITAL_COMMUNITY)
Admission: RE | Admit: 2021-07-31 | Discharge: 2021-07-31 | Disposition: A | Discharge status: Home / Self Care | Payer: BC Managed Care – PPO | Source: Ambulatory Visit | Attending: Primary Care | Admitting: Primary Care

## 2021-07-31 VITALS — BP 110/62 | HR 74 | Temp 98.6°F | Ht 61.5 in | Wt 148.0 lb

## 2021-07-31 DIAGNOSIS — G43709 Chronic migraine without aura, not intractable, without status migrainosus: Secondary | ICD-10-CM

## 2021-07-31 DIAGNOSIS — M797 Fibromyalgia: Secondary | ICD-10-CM | POA: Diagnosis not present

## 2021-07-31 DIAGNOSIS — Z0000 Encounter for general adult medical examination without abnormal findings: Secondary | ICD-10-CM | POA: Diagnosis not present

## 2021-07-31 DIAGNOSIS — R87610 Atypical squamous cells of undetermined significance on cytologic smear of cervix (ASC-US): Secondary | ICD-10-CM | POA: Diagnosis not present

## 2021-07-31 DIAGNOSIS — E1165 Type 2 diabetes mellitus with hyperglycemia: Secondary | ICD-10-CM

## 2021-07-31 DIAGNOSIS — N920 Excessive and frequent menstruation with regular cycle: Secondary | ICD-10-CM

## 2021-07-31 DIAGNOSIS — L409 Psoriasis, unspecified: Secondary | ICD-10-CM | POA: Diagnosis not present

## 2021-07-31 DIAGNOSIS — Z0001 Encounter for general adult medical examination with abnormal findings: Secondary | ICD-10-CM

## 2021-07-31 LAB — HEMOGLOBIN A1C: Hgb A1c MFr Bld: 5.8 % (ref 4.6–6.5)

## 2021-07-31 LAB — IBC + FERRITIN
Ferritin: 4.1 ng/mL — ABNORMAL LOW (ref 10.0–291.0)
Iron: 67 ug/dL (ref 42–145)
Saturation Ratios: 14.4 % — ABNORMAL LOW (ref 20.0–50.0)
TIBC: 466.2 ug/dL — ABNORMAL HIGH (ref 250.0–450.0)
Transferrin: 333 mg/dL (ref 212.0–360.0)

## 2021-07-31 LAB — CBC
HCT: 41.8 % (ref 36.0–46.0)
Hemoglobin: 13.9 g/dL (ref 12.0–15.0)
MCHC: 33.1 g/dL (ref 30.0–36.0)
MCV: 86.8 fl (ref 78.0–100.0)
Platelets: 260 10*3/uL (ref 150.0–400.0)
RBC: 4.82 Mil/uL (ref 3.87–5.11)
RDW: 15.1 % (ref 11.5–15.5)
WBC: 5.3 10*3/uL (ref 4.0–10.5)

## 2021-07-31 LAB — LIPID PANEL
Cholesterol: 172 mg/dL (ref 0–200)
HDL: 81.5 mg/dL (ref >39.00)
LDL Cholesterol: 75 mg/dL (ref 0–99)
NonHDL: 90.3
Total CHOL/HDL Ratio: 2
Triglycerides: 77 mg/dL (ref 0.0–149.0)
VLDL: 15.4 mg/dL (ref 0.0–40.0)

## 2021-07-31 LAB — COMPREHENSIVE METABOLIC PANEL
ALT: 23 U/L (ref 0–35)
AST: 22 U/L (ref 0–37)
Albumin: 4.5 g/dL (ref 3.5–5.2)
Alkaline Phosphatase: 43 U/L (ref 39–117)
BUN: 10 mg/dL (ref 6–23)
CO2: 28 mEq/L (ref 19–32)
Calcium: 9.7 mg/dL (ref 8.4–10.5)
Chloride: 101 mEq/L (ref 96–112)
Creatinine, Ser: 0.85 mg/dL (ref 0.40–1.20)
GFR: 86.39 mL/min (ref >60.00)
Glucose, Bld: 103 mg/dL — ABNORMAL HIGH (ref 70–99)
Potassium: 4.7 mEq/L (ref 3.5–5.1)
Sodium: 136 mEq/L (ref 135–145)
Total Bilirubin: 0.6 mg/dL (ref 0.2–1.2)
Total Protein: 7.1 g/dL (ref 6.0–8.3)

## 2021-07-31 MED ORDER — TRIAMCINOLONE ACETONIDE 0.5 % EX OINT: 1.0000 "application " | TOPICAL_OINTMENT | Freq: Two times a day (BID) | CUTANEOUS | 0 refills | Status: DC

## 2021-07-31 MED ORDER — DULOXETINE HCL 60 MG PO CPEP: 60.0000 mg | ORAL_CAPSULE | Freq: Every day | ORAL | 0 refills | Status: DC

## 2021-07-31 MED ORDER — CLOBETASOL PROPIONATE 0.05 % EX SOLN: 1.0000 "application " | Freq: Two times a day (BID) | CUTANEOUS | 0 refills | Status: DC | PRN

## 2021-07-31 NOTE — Assessment & Plan Note (Signed)
Noted on Pap smear from 2022.  Repeat Pap smear pending today.

## 2021-07-31 NOTE — Assessment & Plan Note (Signed)
Immunizations UTD. Pap smear due, completed today.  Discussed the importance of a healthy diet and regular exercise in order for weight loss, and to reduce the risk of further co-morbidity.  Exam stable. Labs pending.  Follow up in 1 year for repeat physical.  

## 2021-07-31 NOTE — Assessment & Plan Note (Signed)
Repeat A1C pending.  Continue metformin XR 500 mg daily. Foot exam UTD. Eye exam UTD per patient. Urine microalbumin UTD. Not on statin.   Follow up in 6 months.

## 2021-07-31 NOTE — Assessment & Plan Note (Signed)
Overall infrequent.  Continue to monitor.  Not on treatment.

## 2021-07-31 NOTE — Progress Notes (Signed)
Subjective:    Patient ID: Holly Farley, female    DOB: 05-05-1980, 41 y.o.   MRN: [id number]  HPI  Holly Farley is a very pleasant 41 y.o. female who presents today for complete physical and follow up of chronic conditions.  She continues to struggle with right sided neck, shoulder, and right upper back, right lower back, and hip pain. History of fibromyalgia. Currently managed on Cymbalta 30 mg daily for which she has been taking for years. Overall Cymbalta has helped. She is following with chiropractor regularly. She is also managed on cyclobenzaprine 5 mg PRN, uses sparingly. She's not tried a dose increase of her Cymbalta.   Chronic history of psoriasis to scalp and abdomen, sometimes to elbows. Diagnosed during childhood, on treatment at the time. She is currently not on treatment, is open to treatment.   She would also like to mention chronic menorrhagia during regular menstrual cycles with large clots. She will bleed for a total of 7-10 days, bleeds heavily for 4 days. She will sometimes bleed through her clothing. She is not following with GYN. She is taking an oral iron vitamin daily, takes a double dose.  Evaluated for the symptoms and 2022, underwent transvaginal/pelvic ultrasound in June 2022 in August 2022.  Immunizations: -Tetanus: 2019 -Influenza: Completed last season  -Covid-19: 2 vaccines   Diet: Healthy diet.  Exercise: No regular exercise. Tries to exercise when able.   Eye exam: Completes annually  Dental exam: Completes semi-annually   Pap Smear: Completed in May 2022, ASC-US, due today.  BP Readings from Last 3 Encounters:  07/31/21 110/62  10/31/20 110/76  07/09/20 110/72    Wt Readings from Last 3 Encounters:  07/31/21 148 lb (67.1 kg)  10/31/20 154 lb (69.9 kg)  07/09/20 147 lb (66.7 kg)        Review of Systems  Constitutional:  Negative for unexpected weight change.  HENT:  Negative for rhinorrhea.   Eyes:  Negative for visual  disturbance.  Respiratory:  Negative for cough and shortness of breath.   Cardiovascular:  Negative for chest pain.  Gastrointestinal:  Negative for constipation and diarrhea.  Genitourinary:  Positive for menstrual problem. Negative for difficulty urinating.       Menorrhagia   Musculoskeletal:  Positive for myalgias.  Skin:  Negative for rash.  Allergic/Immunologic: Positive for environmental allergies.  Neurological:  Negative for dizziness and headaches.  Psychiatric/Behavioral:  The patient is not nervous/anxious.         Past Medical History:  Diagnosis Date   Acute bilateral thoracic back pain 03/03/2018   Diabetes mellitus without complication (HCC)    Fever and chills 07/04/2018   History of diet-controlled diabetes 01/13/2018   Insulin resistance    Migraines    PCOS (polycystic ovarian syndrome)    with infertility, followed by [facility]   Syncope 05/16/2018   Weight loss, unintentional 01/13/2018    Social History   Socioeconomic History   Marital status: Married    Spouse name: Not on file   Number of children: 2   Years of education: Not on file   Highest education level: Bachelor's degree (e.g., BA, AB, BS)  Occupational History   Not on file  Tobacco Use   Smoking status: Former    Packs/day: 0.75    Years: 7.00    Pack years: 5.25    Types: Cigarettes    Start date: 2002    Quit date: 06/06/2007    Years since quitting:  14.1   Smokeless tobacco: Never  Substance and Sexual Activity   Alcohol use: No   Drug use: No   Sexual activity: Yes    Birth control/protection: Surgical    Comment: tubal ligation  Other Topics Concern   Not on file  Social History Narrative   Caffeine- 300 mg daily   Social Determinants of Health   Financial Resource Strain: Not on file  Food Insecurity: Not on file  Transportation Needs: Not on file  Physical Activity: Not on file  Stress: Not on file  Social Connections: Not on file  Intimate Partner Violence: Not on file     Past Surgical History:  Procedure Laterality Date   CESAREAN SECTION     ESOPHAGOGASTRODUODENOSCOPY     GASTRIC ROUX-EN-Y N/A 09/24/2014   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;  Surgeon: Luretha MurphyMatthew Martin, MD;  Location: [facility];  Service: General;  Laterality: N/A;   TUBAL LIGATION      Family History  Problem Relation Age of Onset   Bipolar disorder Mother    Migraines Mother    Bipolar disorder Brother    Diabetes Other    Hypertrophic cardiomyopathy Paternal Grandmother [id number]   Hypertrophic cardiomyopathy Cousin 31    No Known Allergies  Current Outpatient Medications on File Prior to Visit  Medication Sig Dispense Refill   azelastine (ASTELIN) 0.1 % nasal spray USE 1 SPRAY IN BOTH NOSRILS TWICE DAILY 30 mL 0   [name] Take 1 tablet by mouth 3 (three) times daily.      Cyanocobalamin ([name]) 2500 MCG SUBL Place 2,500 mcg under the tongue daily.     cyclobenzaprine (FLEXERIL) 5 MG tablet Take 1 tablet (5 mg total) by mouth at bedtime as needed. For muscle spasms 90 tablet 0   DULoxetine (CYMBALTA) 30 MG capsule TAKE 1 CAPSULE BY MOUTH DAILY FOR PAIN. 90 capsule 3   fluticasone (FLONASE) 50 MCG/ACT nasal spray Place 2 sprays into both nostrils daily. 16 g 6   metFORMIN (GLUCOPHAGE XR) 500 MG 24 hr tablet Take 1 tablet (500 mg total) by mouth daily with breakfast. For diabetes. 90 tablet 3   Multiple Vitamin (MULTIVITAMIN WITH MINERALS) TABS tablet Take 1 tablet by mouth daily.     No current facility-administered medications on file prior to visit.    BP 110/62   Pulse 74   Temp 98.6 F (37 C) (Oral)   Ht 5' 1.5" (1.562 m)   Wt 148 lb (67.1 kg)   SpO2 98%   BMI 27.51 kg/m  Objective:   Physical Exam Exam conducted with a chaperone present.  HENT:     Right Ear: Tympanic membrane and ear canal normal.     Left Ear: Tympanic membrane and ear canal normal.     Nose: Nose normal.  Eyes:      Conjunctiva/sclera: Conjunctivae normal.     Pupils: Pupils are equal, round, and reactive to light.  Neck:     Thyroid: No thyromegaly.  Cardiovascular:     Rate and Rhythm: Normal rate and regular rhythm.     Heart sounds: No murmur heard. Pulmonary:     Effort: Pulmonary effort is normal.     Breath sounds: Normal breath sounds. No rales.  Abdominal:     General: Bowel sounds are normal.     Palpations: Abdomen is soft.     Tenderness: There is no abdominal tenderness.  Genitourinary:    Labia:  Right: No tenderness or lesion.        Left: No tenderness or lesion.      Vagina: Normal.     Cervix: Normal.     Uterus: Normal.      Adnexa: Right adnexa normal and left adnexa normal.  Musculoskeletal:        General: Normal range of motion.     Cervical back: Neck supple.  Lymphadenopathy:     Cervical: No cervical adenopathy.  Skin:    General: Skin is warm and dry.     Findings: No rash.     Comments: Plaque of scaly skin noted to right lower abdomen.  Scaly skin noted throughout scalp. Mild.   Neurological:     Mental Status: She is alert and oriented to person, place, and time.     Cranial Nerves: No cranial nerve deficit.     Deep Tendon Reflexes: Reflexes are normal and symmetric.          Assessment & Plan:

## 2021-07-31 NOTE — Assessment & Plan Note (Signed)
Unchanged.  Reviewed pelvic/transvaginal ultrasound from June and August 2022.  Repeat iron studies/CBC pending today. Consider GYN referral.

## 2021-07-31 NOTE — Patient Instructions (Signed)
Stop by the lab prior to leaving today. I will notify you of your results once received.   You may use triamcinolone 0.5% ointment twice daily as needed for the psoriasis to your abdomen/extremities.  You may use the clobetasol 0.05% topical solution to your scalp twice daily as needed.  We increased the dose of your Cymbalta to 60 mg for fibromyalgia.  Please update me on these changes.  Please schedule a follow up visit for 6 months for a diabetes check.  It was a pleasure to see you today!  Preventive Care 39-39 Years Old, Female Preventive care refers to lifestyle choices and visits with your health care provider that can promote health and wellness. Preventive care visits are also called wellness exams. What can I expect for my preventive care visit? Counseling During your preventive care visit, your health care provider may ask about your: Medical history, including: Past medical problems. Family medical history. Pregnancy history. Current health, including: Menstrual cycle. Method of birth control. Emotional well-being. Home life and relationship well-being. Sexual activity and sexual health. Lifestyle, including: Alcohol, nicotine or tobacco, and drug use. Access to firearms. Diet, exercise, and sleep habits. Work and work Statistician. Sunscreen use. Safety issues such as seatbelt and bike helmet use. Physical exam Your health care provider may check your: Height and weight. These may be used to calculate your BMI (body mass index). BMI is a measurement that tells if you are at a healthy weight. Waist circumference. This measures the distance around your waistline. This measurement also tells if you are at a healthy weight and may help predict your risk of certain diseases, such as type 2 diabetes and high blood pressure. Heart rate and blood pressure. Body temperature. Skin for abnormal spots. What immunizations do I need?  Vaccines are usually given at various  ages, according to a schedule. Your health care provider will recommend vaccines for you based on your age, medical history, and lifestyle or other factors, such as travel or where you work. What tests do I need? Screening Your health care provider may recommend screening tests for certain conditions. This may include: Pelvic exam and Pap test. Lipid and cholesterol levels. Diabetes screening. This is done by checking your blood sugar (glucose) after you have not eaten for a while (fasting). Hepatitis B test. Hepatitis C test. HIV (human immunodeficiency virus) test. STI (sexually transmitted infection) testing, if you are at risk. BRCA-related cancer screening. This may be done if you have a family history of breast, ovarian, tubal, or peritoneal cancers. Talk with your health care provider about your test results, treatment options, and if necessary, the need for more tests. Follow these instructions at home: Eating and drinking  Eat a healthy diet that includes fresh fruits and vegetables, whole grains, lean protein, and low-fat dairy products. Take vitamin and mineral supplements as recommended by your health care provider. Do not drink alcohol if: Your health care provider tells you not to drink. You are pregnant, may be pregnant, or are planning to become pregnant. If you drink alcohol: Limit how much you have to 0-1 drink a day. Know how much alcohol is in your drink. In the [place] one drink equals one 12 oz bottle of beer (355 mL), one 5 oz glass of wine (148 mL), or one 1 oz glass of hard liquor (44 mL). Lifestyle Brush your teeth every morning and night with fluoride toothpaste. Floss one time each day. Exercise for at least 30 minutes 5 or more days  each week. Do not use any products that contain nicotine or tobacco. These products include cigarettes, chewing tobacco, and vaping devices, such as e-cigarettes. If you need help quitting, ask your health care provider. Do not use  drugs. If you are sexually active, practice safe sex. Use a condom or other form of protection to prevent STIs. If you do not wish to become pregnant, use a form of birth control. If you plan to become pregnant, see your health care provider for a prepregnancy visit. Find healthy ways to manage stress, such as: Meditation, yoga, or listening to music. Journaling. Talking to a trusted person. Spending time with friends and family. Minimize exposure to UV radiation to reduce your risk of skin cancer. Safety Always wear your seat belt while driving or riding in a vehicle. Do not drive: If you have been drinking alcohol. Do not ride with someone who has been drinking. If you have been using any mind-altering substances or drugs. While texting. When you are tired or distracted. Wear a helmet and other protective equipment during sports activities. If you have firearms in your house, make sure you follow all gun safety procedures. Seek help if you have been physically or sexually abused. What's next? Go to your health care provider once a year for an annual wellness visit. Ask your health care provider how often you should have your eyes and teeth checked. Stay up to date on all vaccines. This information is not intended to replace advice given to you by your health care provider. Make sure you discuss any questions you have with your health care provider. Document Revised: 08/14/2020 Document Reviewed: 08/14/2020 Elsevier Patient Education  [place].

## 2021-07-31 NOTE — Assessment & Plan Note (Addendum)
Chronic to scalp and trunk.   Start Triamcinolone 0.5% ointment BID PRN. Start clobetasol 0.05% solution BID PRN.  She will update.

## 2021-07-31 NOTE — Assessment & Plan Note (Signed)
Uncontrolled.  Continue chiropractor follow up.  We discussed options for treatment, we decided to increase her Cymbalta to 60 mg from 30 mg. Continue cyclobenzaprine 5 mg PRN.  She will update.

## 2021-08-05 ENCOUNTER — Telehealth: Payer: Self-pay | Admitting: Primary Care

## 2021-08-05 DIAGNOSIS — D509 Iron deficiency anemia, unspecified: Secondary | ICD-10-CM

## 2021-08-05 LAB — CYTOLOGY - PAP
Comment: NEGATIVE
Diagnosis: NEGATIVE
High risk HPV: NEGATIVE

## 2021-08-05 NOTE — Telephone Encounter (Signed)
Pt called back about question through mychart and said that Mayra Reel had mentioned either having pt do an iron infusion or going to see a hematologist and pt said she is fine with doing either one that Jae Dire would recommend she do. Call back is [phone]

## 2021-08-06 NOTE — Telephone Encounter (Signed)
Does she have any symptoms of low iron such as fatigue, ice cravings, shortness of breath? If any of those then I will refer her to hematology. [place] or [employer]?

## 2021-08-07 NOTE — Telephone Encounter (Signed)
Patient called back she is having extreme fatigue some shortness of breath. Denies any ice cravings. She would like to have sent to [place].

## 2021-08-07 NOTE — Addendum Note (Signed)
Addended by: Doreene Nest on: 08/07/2021 05:23 PM   Modules accepted: Orders

## 2021-08-07 NOTE — Telephone Encounter (Signed)
Noted, referral placed.  

## 2021-08-08 ENCOUNTER — Encounter: Payer: Self-pay | Admitting: Primary Care

## 2021-08-19 ENCOUNTER — Encounter: Payer: Self-pay | Admitting: Oncology

## 2021-08-19 ENCOUNTER — Inpatient Hospital Stay: Payer: BC Managed Care – PPO

## 2021-08-19 ENCOUNTER — Inpatient Hospital Stay: Payer: BC Managed Care – PPO | Attending: Oncology | Admitting: Oncology

## 2021-08-19 VITALS — BP 102/83 | HR 58 | Temp 98.1°F | Resp 16 | Ht 62.0 in | Wt 150.0 lb

## 2021-08-19 DIAGNOSIS — M797 Fibromyalgia: Secondary | ICD-10-CM | POA: Diagnosis not present

## 2021-08-19 DIAGNOSIS — Z9884 Bariatric surgery status: Secondary | ICD-10-CM | POA: Diagnosis not present

## 2021-08-19 DIAGNOSIS — D509 Iron deficiency anemia, unspecified: Secondary | ICD-10-CM | POA: Insufficient documentation

## 2021-08-19 DIAGNOSIS — Z79899 Other long term (current) drug therapy: Secondary | ICD-10-CM | POA: Insufficient documentation

## 2021-08-19 NOTE — Progress Notes (Signed)
Hematology/Oncology Consult note [facility] [phone] [phone]  Patient Care Team: Doreene Nest, NP as PCP - General (Internal Medicine) Jodelle Red, MD as PCP - Cardiology (Cardiology)   Name of the patient: Holly Farley  [id number]  1980-04-30    Reason for referral-iron deficiency anemia   Referring physician-Catherine Chestine Spore, NP  Date of visit: 08/19/21   History of presenting illness- Patient is a 41 year old female with a past medically history significant for gastric bypass surgery in 2016, polycystic ovarian disease, type 2 diabetes, fibromyalgia referred for iron deficiency anemia.  Her CBC from 07/31/2021 showed white count of 5.3, H&H of 13.9/41.8 and a platelet count of 260.  She was noted to have a hemoglobin of 9.2 about a year ago which subsequently improved.  Iron studies presently show a ferritin of 4.1 with an elevated TIBC of 466 with an iron saturation of 14%.  Note that her menstrual cycles can be heavy in the last for about 7 days.  She has 2 adult children and does not wish to have any more pregnancies.  No family history of colon cancer.  Denies any consistent use of NSAIDs.  She has ongoing neck pain and has a diagnosis of fibromyalgia and hopes to get back with neurology soon.  ECOG PS- 0  Pain scale- 3   Review of systems- Review of Systems  Constitutional:  Negative for chills, fever, malaise/fatigue and weight loss.  HENT:  Negative for congestion, ear discharge and nosebleeds.   Eyes:  Negative for blurred vision.  Respiratory:  Negative for cough, hemoptysis, sputum production, shortness of breath and wheezing.   Cardiovascular:  Negative for chest pain, palpitations, orthopnea and claudication.  Gastrointestinal:  Negative for abdominal pain, blood in stool, constipation, diarrhea, heartburn, melena, nausea and vomiting.  Genitourinary:  Negative for dysuria, flank pain, frequency,  hematuria and urgency.  Musculoskeletal:  Positive for neck pain. Negative for back pain, joint pain and myalgias.  Skin:  Negative for rash.  Neurological:  Negative for dizziness, tingling, focal weakness, seizures, weakness and headaches.  Endo/Heme/Allergies:  Does not bruise/bleed easily.  Psychiatric/Behavioral:  Negative for depression and suicidal ideas. The patient does not have insomnia.     No Known Allergies  Patient Active Problem List   Diagnosis Date Noted   ASCUS of cervix with negative high risk HPV 07/31/2021   Menorrhagia with regular cycle 07/31/2021   Glucosuria 04/10/2020   Type 2 diabetes mellitus with hyperglycemia (HCC) 04/10/2020   Allergic rhinitis 11/09/2019   ETD (Eustachian tube dysfunction), bilateral 11/09/2019   Migraines 07/06/2019   False positive ana 06/28/2019   Positive ANA (antinuclear antibody) 05/16/2019   History of [place] spotted fever 05/16/2019   Myofascial muscle pain 05/16/2019   Arthralgia 05/16/2019   Fibromyalgia 05/05/2019   Psoriasis 09/01/2018   Family history of hypertrophic cardiomyopathy 04/25/2018   Cervical lymphadenopathy 03/03/2018   Encounter for annual general medical examination with abnormal findings in adult 01/13/2018   Rosacea 10/08/2014   Gastric bypass status for obesity July 2016 09/26/2014   Polycystic ovaries 12/17/2008     Past Medical History:  Diagnosis Date   Acute bilateral thoracic back pain 03/03/2018   Diabetes mellitus without complication (HCC)    Fever and chills 07/04/2018   History of diet-controlled diabetes 01/13/2018   Insulin resistance    Migraines    PCOS (polycystic ovarian syndrome)    with infertility, followed by [facility]   R[facility] spotted fever)  09/01/2018   Syncope 05/16/2018   Weight loss, unintentional 01/13/2018     Past Surgical History:  Procedure Laterality Date   CESAREAN SECTION     ESOPHAGOGASTRODUODENOSCOPY     GASTRIC ROUX-EN-Y N/A 09/24/2014    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;  Surgeon: Luretha Murphy, MD;  Location: [facility];  Service: General;  Laterality: N/A;   TUBAL LIGATION      Social History   Socioeconomic History   Marital status: Married    Spouse name: Not on file   Number of children: 2   Years of education: Not on file   Highest education level: Bachelor's degree (e.g., BA, AB, BS)  Occupational History   Not on file  Tobacco Use   Smoking status: Former    Packs/day: 0.75    Years: 7.00    Total pack years: 5.25    Types: Cigarettes    Start date: 2002    Quit date: 06/06/2007    Years since quitting: 14.2   Smokeless tobacco: Never  Substance and Sexual Activity   Alcohol use: No   Drug use: No   Sexual activity: Yes    Birth control/protection: Surgical    Comment: tubal ligation  Other Topics Concern   Not on file  Social History Narrative   Caffeine- 300 mg daily   Social Determinants of Health   Financial Resource Strain: Not on file  Food Insecurity: Not on file  Transportation Needs: Not on file  Physical Activity: Not on file  Stress: Not on file  Social Connections: Not on file  Intimate Partner Violence: Not on file     Family History  Problem Relation Age of Onset   Bipolar disorder Mother    Migraines Mother    Bipolar disorder Brother    Diabetes Other    Hypertrophic cardiomyopathy Paternal Grandmother 58   Hypertrophic cardiomyopathy Cousin 54     Current Outpatient Medications:    azelastine (ASTELIN) 0.1 % nasal spray, USE 1 SPRAY IN BOTH NOSRILS TWICE DAILY, Disp: 30 mL, Rfl: 0   [name], Take 1 tablet by mouth 3 (three) times daily. , Disp: , Rfl:    clobetasol (TEMOVATE) 0.05 % external solution, Apply 1 application. topically 2 (two) times daily as needed., Disp: 50 mL, Rfl: 0   Cyanocobalamin ([name]) 2500 MCG SUBL, Place 2,500 mcg under the tongue daily., Disp: , Rfl:    DULoxetine  (CYMBALTA) 60 MG capsule, Take 1 capsule (60 mg total) by mouth daily. For fibromyalgia, Disp: 90 capsule, Rfl: 0   ferrous sulfate 325 (65 FE) MG tablet, Take 325 mg by mouth daily with breakfast., Disp: , Rfl:    metFORMIN (GLUCOPHAGE XR) 500 MG 24 hr tablet, Take 1 tablet (500 mg total) by mouth daily with breakfast. For diabetes., Disp: 90 tablet, Rfl: 3   Multiple Vitamin (MULTIVITAMIN WITH MINERALS) TABS tablet, Take 1 tablet by mouth daily., Disp: , Rfl:    triamcinolone ointment (KENALOG) 0.5 %, Apply 1 application. topically 2 (two) times daily., Disp: 30 g, Rfl: 0   cyclobenzaprine (FLEXERIL) 5 MG tablet, Take 1 tablet (5 mg total) by mouth at bedtime as needed. For muscle spasms (Patient not taking: Reported on 08/19/2021), Disp: 90 tablet, Rfl: 0   fluticasone (FLONASE) 50 MCG/ACT nasal spray, Place 2 sprays into both nostrils daily. (Patient not taking: Reported on 08/19/2021), Disp: 16 g, Rfl: 6   Physical exam:  Vitals:  08/19/21 1446  BP: 102/83  Pulse: (!) 58  Resp: 16  Temp: 98.1 F (36.7 C)  TempSrc: Oral  Weight: 150 lb (68 kg)  Height: 5\' 2"  (1.575 m)   Physical Exam Cardiovascular:     Rate and Rhythm: Normal rate and regular rhythm.     Heart sounds: Normal heart sounds.  Pulmonary:     Effort: Pulmonary effort is normal.     Breath sounds: Normal breath sounds.  Abdominal:     General: Bowel sounds are normal.     Palpations: Abdomen is soft.  Skin:    General: Skin is warm and dry.  Neurological:     Mental Status: She is alert and oriented to person, place, and time.           Latest Ref Rng & Units 07/31/2021    9:00 AM  CMP  Glucose 70 - 99 mg/dL 09/30/2021   BUN 6 - 23 mg/dL 10   Creatinine 729 - 1.20 mg/dL 0.21   Sodium 1.15 - 520 mEq/L 136   Potassium 3.5 - 5.1 mEq/L 4.7   Chloride 96 - 112 mEq/L 101   CO2 19 - 32 mEq/L 28   Calcium 8.4 - 10.5 mg/dL 9.7   Total Protein 6.0 - 8.3 g/dL 7.1   Total Bilirubin 0.2 - 1.2 mg/dL 0.6   Alkaline Phos  39 - 117 U/L 43   AST 0 - 37 U/L 22   ALT 0 - 35 U/L 23       Latest Ref Rng & Units 07/31/2021    9:00 AM  CBC  WBC 4.0 - 10.5 K/uL 5.3   Hemoglobin 12.0 - 15.0 g/dL 09/30/2021   Hematocrit 23.3 - 46.0 % 41.8   Platelets 150.0 - 400.0 K/uL 260.0     No images are attached to the encounter.  No results found.  Assessment and plan- Patient is a 41 y.o. female referred for iron deficiency anemia  Patient does not anemic although her labs are suggestive of iron deficiency.  Likely secondary to gastric bypass.  We can proceed with IV iron given her gastric bypass status and concern for absorption.  Discussed use of benefits of IV iron including all but not limited to possible risk of allergic or anaphylactic reaction.  Patient understands and agrees to proceed as planned.  We will repeat CBC ferritin and iron studies B12 folate TSH celiac disease panel in about 8 weeks and see her thereafter for a video visit   Thank you for this kind referral and the opportunity to participate in the care of this patient   Visit Diagnosis 1. Iron deficiency anemia, unspecified iron deficiency anemia type     Dr. 24, MD, MPH [facility] at [facility] 08/19/2021

## 2021-08-28 ENCOUNTER — Inpatient Hospital Stay: Payer: BC Managed Care – PPO

## 2021-08-29 ENCOUNTER — Inpatient Hospital Stay: Payer: BC Managed Care – PPO

## 2021-08-29 VITALS — BP 98/68 | HR 60 | Temp 97.8°F | Resp 16

## 2021-08-29 DIAGNOSIS — D509 Iron deficiency anemia, unspecified: Secondary | ICD-10-CM | POA: Diagnosis not present

## 2021-08-29 MED ORDER — IRON SUCROSE 20 MG/ML IV SOLN
200.0000 mg | Freq: Once | INTRAVENOUS | Status: AC
  Administered 2021-08-29: 200 mg via INTRAVENOUS

## 2021-08-29 MED ORDER — SODIUM CHLORIDE 0.9 % IV SOLN: 200.0000 mg | INTRAVENOUS | Status: DC

## 2021-08-29 MED ORDER — SODIUM CHLORIDE 0.9 % IV SOLN
Freq: Once | INTRAVENOUS | Status: AC
  Filled 2021-08-29: qty 250

## 2021-08-29 NOTE — Patient Instructions (Signed)
[facility] CANCER CTR AT [place]-MEDICAL ONCOLOGY  Discharge Instructions: Thank you for choosing [facility] to provide your oncology and hematology care.  If you have a lab appointment with the Cancer Center, please go directly to the Cancer Center and check in at the registration area.  We strive to give you quality time with your provider. You may need to reschedule your appointment if you arrive late (15 or more minutes).  Arriving late affects you and other patients whose appointments are after yours.  Also, if you miss three or more appointments without notifying the office, you may be dismissed from the clinic at the provider's discretion.      For prescription refill requests, have your pharmacy contact our office and allow 72 hours for refills to be completed.    BELOW ARE SYMPTOMS THAT SHOULD BE REPORTED IMMEDIATELY: *FEVER GREATER THAN 100.4 F (38 C) OR HIGHER *CHILLS OR SWEATING *NAUSEA AND VOMITING THAT IS NOT CONTROLLED WITH YOUR NAUSEA MEDICATION *UNUSUAL SHORTNESS OF BREATH *UNUSUAL BRUISING OR BLEEDING *URINARY PROBLEMS (pain or burning when urinating, or frequent urination) *BOWEL PROBLEMS (unusual diarrhea, constipation, pain near the anus) TENDERNESS IN MOUTH AND THROAT WITH OR WITHOUT PRESENCE OF ULCERS (sore throat, sores in mouth, or a toothache) UNUSUAL RASH, SWELLING OR PAIN  UNUSUAL VAGINAL DISCHARGE OR ITCHING   Items with * indicate a potential emergency and should be followed up as soon as possible or go to the Emergency Department if any problems should occur.  Should you have questions after your visit or need to cancel or reschedule your appointment, please contact [facility] CANCER CTR AT [place]-MEDICAL ONCOLOGY  [phone] and follow the prompts.  Office hours are 8:00 a.m. to 4:30 p.m. Monday - Friday. Please note that voicemails left after 4:00 p.m. may not be returned until the following business day.  We are closed weekends and major holidays.  You have access to a nurse at all times for urgent questions. Please call the main number to the clinic [phone] and follow the prompts.  For any non-urgent questions, you may also contact your provider using MyChart. We now offer e-Visits for anyone 5 and older to request care online for non-urgent symptoms. For details visit [email or web address].   Also download the MyChart app! Go to the app store, search "MyChart", open the app, select , and log in with your MyChart username and password.  Masks are optional in the cancer centers. If you would like for your care team to wear a mask while they are taking care of you, please let them know. For doctor visits, patients may have with them one support person who is at least 39 years old. At this time, visitors are not allowed in the infusion area.

## 2021-09-05 ENCOUNTER — Inpatient Hospital Stay: Payer: BC Managed Care – PPO | Attending: Oncology

## 2021-09-05 VITALS — BP 108/76 | HR 80 | Temp 97.2°F | Resp 18

## 2021-09-05 DIAGNOSIS — Z79899 Other long term (current) drug therapy: Secondary | ICD-10-CM | POA: Diagnosis not present

## 2021-09-05 DIAGNOSIS — D509 Iron deficiency anemia, unspecified: Secondary | ICD-10-CM | POA: Insufficient documentation

## 2021-09-05 MED ORDER — SODIUM CHLORIDE 0.9 % IV SOLN
Freq: Once | INTRAVENOUS | Status: AC
  Filled 2021-09-05: qty 250

## 2021-09-05 MED ORDER — SODIUM CHLORIDE 0.9 % IV SOLN
200.0000 mg | INTRAVENOUS | Status: DC
  Administered 2021-09-05: 200 mg via INTRAVENOUS
  Filled 2021-09-05: qty 10

## 2021-09-05 NOTE — Patient Instructions (Signed)
[facility] CANCER CTR AT [place]-MEDICAL ONCOLOGY  Discharge Instructions: Thank you for choosing [facility] to provide your oncology and hematology care.  If you have a lab appointment with the Cancer Center, please go directly to the Cancer Center and check in at the registration area.  Wear comfortable clothing and clothing appropriate for easy access to any Portacath or PICC line.   We strive to give you quality time with your provider. You may need to reschedule your appointment if you arrive late (15 or more minutes).  Arriving late affects you and other patients whose appointments are after yours.  Also, if you miss three or more appointments without notifying the office, you may be dismissed from the clinic at the provider's discretion.      For prescription refill requests, have your pharmacy contact our office and allow 72 hours for refills to be completed.    Today you received the following chemotherapy and/or immunotherapy agents VENOFER      To help prevent nausea and vomiting after your treatment, we encourage you to take your nausea medication as directed.  BELOW ARE SYMPTOMS THAT SHOULD BE REPORTED IMMEDIATELY: *FEVER GREATER THAN 100.4 F (38 C) OR HIGHER *CHILLS OR SWEATING *NAUSEA AND VOMITING THAT IS NOT CONTROLLED WITH YOUR NAUSEA MEDICATION *UNUSUAL SHORTNESS OF BREATH *UNUSUAL BRUISING OR BLEEDING *URINARY PROBLEMS (pain or burning when urinating, or frequent urination) *BOWEL PROBLEMS (unusual diarrhea, constipation, pain near the anus) TENDERNESS IN MOUTH AND THROAT WITH OR WITHOUT PRESENCE OF ULCERS (sore throat, sores in mouth, or a toothache) UNUSUAL RASH, SWELLING OR PAIN  UNUSUAL VAGINAL DISCHARGE OR ITCHING   Items with * indicate a potential emergency and should be followed up as soon as possible or go to the Emergency Department if any problems should occur.  Please show the CHEMOTHERAPY ALERT CARD or IMMUNOTHERAPY ALERT CARD at check-in to the  Emergency Department and triage nurse.  Should you have questions after your visit or need to cancel or reschedule your appointment, please contact [facility] CANCER CTR AT [place]-MEDICAL ONCOLOGY  [phone] and follow the prompts.  Office hours are 8:00 a.m. to 4:30 p.m. Monday - Friday. Please note that voicemails left after 4:00 p.m. may not be returned until the following business day.  We are closed weekends and major holidays. You have access to a nurse at all times for urgent questions. Please call the main number to the clinic [phone] and follow the prompts.  For any non-urgent questions, you may also contact your provider using MyChart. We now offer e-Visits for anyone 18 and older to request care online for non-urgent symptoms. For details visit [email or web address].   Also download the MyChart app! Go to the app store, search "MyChart", open the app, select , and log in with your MyChart username and password.  Masks are optional in the cancer centers. If you would like for your care team to wear a mask while they are taking care of you, please let them know. For doctor visits, patients may have with them one support person who is at least 39 years old. At this time, visitors are not allowed in the infusion area.   Iron Sucrose Injection What is this medication? IRON SUCROSE (EYE ern SOO krose) treats low levels of iron (iron deficiency anemia) in people with kidney disease. Iron is a mineral that plays an important role in making red blood cells, which carry oxygen from your lungs to the rest of your body. This medicine may   be used for other purposes; ask your health care provider or pharmacist if you have questions. COMMON BRAND NAME(S): Venofer What should I tell my care team before I take this medication? They need to know if you have any of these conditions: Anemia not caused by low iron levels Heart disease High levels of iron in the blood Kidney disease Liver  disease An unusual or allergic reaction to iron, other medications, foods, dyes, or preservatives Pregnant or trying to get pregnant Breast-feeding How should I use this medication? This medication is for infusion into a vein. It is given in a hospital or clinic setting. Talk to your care team about the use of this medication in children. While this medication may be prescribed for children as young as 2 years for selected conditions, precautions do apply. Overdosage: If you think you have taken too much of this medicine contact a poison control center or emergency room at once. NOTE: This medicine is only for you. Do not share this medicine with others. What if I miss a dose? It is important not to miss your dose. Call your care team if you are unable to keep an appointment. What may interact with this medication? Do not take this medication with any of the following: Deferoxamine Dimercaprol Other iron products This medication may also interact with the following: Chloramphenicol Deferasirox This list may not describe all possible interactions. Give your health care provider a list of all the medicines, herbs, non-prescription drugs, or dietary supplements you use. Also tell them if you smoke, drink alcohol, or use illegal drugs. Some items may interact with your medicine. What should I watch for while using this medication? Visit your care team regularly. Tell your care team if your symptoms do not start to get better or if they get worse. You may need blood work done while you are taking this medication. You may need to follow a special diet. Talk to your care team. Foods that contain iron include: whole grains/cereals, dried fruits, beans, or peas, leafy green vegetables, and organ meats (liver, kidney). What side effects may I notice from receiving this medication? Side effects that you should report to your care team as soon as possible: Allergic reactions--skin rash, itching, hives,  swelling of the face, lips, tongue, or throat Low blood pressure--dizziness, feeling faint or lightheaded, blurry vision Shortness of breath Side effects that usually do not require medical attention (report to your care team if they continue or are bothersome): Flushing Headache Joint pain Muscle pain Nausea Pain, redness, or irritation at injection site This list may not describe all possible side effects. Call your doctor for medical advice about side effects. You may report side effects to FDA at 1-800-FDA-1088. Where should I keep my medication? This medication is given in a hospital or clinic and will not be stored at home. NOTE: This sheet is a summary. It may not cover all possible information. If you have questions about this medicine, talk to your doctor, pharmacist, or health care provider.  2023 Elsevier/Gold Standard (2020-07-12 00:00:00)   

## 2021-09-09 MED FILL — Iron Sucrose Inj 20 MG/ML (Fe Equiv): INTRAVENOUS | Qty: 10 | Status: AC

## 2021-09-10 ENCOUNTER — Inpatient Hospital Stay: Payer: BC Managed Care – PPO

## 2021-09-10 VITALS — BP 102/79 | HR 70 | Temp 98.5°F | Resp 16

## 2021-09-10 DIAGNOSIS — D509 Iron deficiency anemia, unspecified: Secondary | ICD-10-CM | POA: Diagnosis not present

## 2021-09-10 MED ORDER — SODIUM CHLORIDE 0.9 % IV SOLN
200.0000 mg | INTRAVENOUS | Status: DC
  Administered 2021-09-10: 200 mg via INTRAVENOUS
  Filled 2021-09-10: qty 200

## 2021-09-10 MED ORDER — SODIUM CHLORIDE 0.9 % IV SOLN
Freq: Once | INTRAVENOUS | Status: AC
  Filled 2021-09-10: qty 250

## 2021-09-12 ENCOUNTER — Inpatient Hospital Stay: Payer: BC Managed Care – PPO

## 2021-09-18 ENCOUNTER — Inpatient Hospital Stay: Payer: BC Managed Care – PPO

## 2021-09-26 ENCOUNTER — Inpatient Hospital Stay: Payer: BC Managed Care – PPO

## 2021-09-26 VITALS — BP 102/76 | HR 72 | Temp 98.0°F

## 2021-09-26 DIAGNOSIS — D509 Iron deficiency anemia, unspecified: Secondary | ICD-10-CM | POA: Diagnosis not present

## 2021-09-26 MED ORDER — SODIUM CHLORIDE 0.9 % IV SOLN
200.0000 mg | INTRAVENOUS | Status: DC
  Administered 2021-09-26: 200 mg via INTRAVENOUS
  Filled 2021-09-26: qty 200

## 2021-09-26 MED ORDER — SODIUM CHLORIDE 0.9 % IV SOLN
Freq: Once | INTRAVENOUS | Status: AC
  Filled 2021-09-26: qty 250

## 2021-09-26 NOTE — Patient Instructions (Signed)

## 2021-10-03 ENCOUNTER — Inpatient Hospital Stay: Payer: BC Managed Care – PPO | Attending: Oncology

## 2021-10-03 VITALS — BP 95/73 | HR 78 | Temp 96.5°F | Resp 17

## 2021-10-03 DIAGNOSIS — D509 Iron deficiency anemia, unspecified: Secondary | ICD-10-CM | POA: Diagnosis present

## 2021-10-03 MED ORDER — SODIUM CHLORIDE 0.9 % IV SOLN
Freq: Once | INTRAVENOUS | Status: AC
  Filled 2021-10-03: qty 250

## 2021-10-03 MED ORDER — SODIUM CHLORIDE 0.9 % IV SOLN
200.0000 mg | INTRAVENOUS | Status: DC
  Administered 2021-10-03: 200 mg via INTRAVENOUS
  Filled 2021-10-03: qty 200

## 2021-10-23 HISTORY — PX: WISDOM TOOTH EXTRACTION: SHX21

## 2021-10-29 ENCOUNTER — Ambulatory Visit: Payer: BC Managed Care – PPO | Admitting: Primary Care

## 2021-10-29 ENCOUNTER — Encounter: Payer: Self-pay | Admitting: Primary Care

## 2021-10-29 VITALS — BP 108/74 | HR 79 | Temp 97.5°F | Ht 62.0 in | Wt 150.0 lb

## 2021-10-29 DIAGNOSIS — J3489 Other specified disorders of nose and nasal sinuses: Secondary | ICD-10-CM | POA: Insufficient documentation

## 2021-10-29 MED ORDER — PREDNISONE 20 MG PO TABS: ORAL_TABLET | ORAL | 0 refills | Status: DC

## 2021-10-29 NOTE — Assessment & Plan Note (Signed)
Unlikely still bacterial at this point given her 6 day course of Amoxil 500 mg.  No double worsening. She appears well. Exam overall benign.   Continue Amoxil 500 mg TID until complete. Start Prednisone 40 mg daily x 5 days. Stop Allegra-D. Discussed potential side effect of rebound symptoms with prolonged use.  Discussed use of Flonase once prednisone is complete.  Return precautions provided.

## 2021-10-29 NOTE — Progress Notes (Signed)
Subjective:    Patient ID: Holly Farley, female    DOB: 21-Jun-1980, 41 y.o.   MRN: [id number]  HPI  Holly Farley is a very pleasant 41 y.o. female with a history of migraines, allergic rhinitis, who presents today to discuss sinus pressure.  Symptom onset one week ago with sinus drainage. She then developed left ear fullness, frontal and maxillary sinus pressure, cough.   She had all four wisdom teeth cut out 6 days ago, 1 day after symptom onset. She's managed on amoxicillin 500 mg TID for which she has been taking for the last 6 days, has one day remaining. She continues to notice right upper and lower dental pain, the left side isn't as bad.   Her husband has a sinus infection, is being treated with antibiotics.   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   She's been taking Allegra-D for the last 5 days, and a menthol nasal spray with temporary improvement. She has not tested for Covid-19. She felt better this morning, but now her sinus pressure is about the same.   BP Readings from Last 3 Encounters:  10/29/21 108/74  10/03/21 95/73  09/26/21 102/76        Review of Systems  Constitutional:  Negative for fever.  HENT:  Positive for sinus pressure. Negative for ear pain.        Right sided dental pain, upper and lower. Left ear fullness  Respiratory:  Positive for cough. Negative for shortness of breath.          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   Syncope  05/16/2018   Weight loss, unintentional 01/13/2018    Social History   Socioeconomic History   Marital status: Married    Spouse name: Not on file   Number of children: 2   Years of education: Not on file   Highest education level: Bachelor's degree (e.g., BA, AB, BS)  Occupational History   Not on file  Tobacco Use   Smoking status: Former    Packs/day: 0.75    Years: 7.00    Total pack years: 5.25    Types: Cigarettes    Start date: 2002    Quit date: 06/06/2007    Years since quitting: 14.4   Smokeless tobacco: Never  Vaping Use   Vaping Use: Never used  Substance and Sexual Activity   Alcohol use: No   Drug use: No   Sexual activity: Yes    Birth control/protection: Surgical    Comment: tubal ligation  Other Topics Concern   Not on file  Social History Narrative   Caffeine- 300 mg daily   Social Determinants of Health   Financial Resource Strain: Not on file  Food Insecurity: Not on file  Transportation Needs: Not on file  Physical Activity: Not on file  Stress: Not on file  Social Connections: Not on file  Intimate Partner Violence: Not on file  Past Surgical History:  Procedure Laterality Date   CESAREAN SECTION     ESOPHAGOGASTRODUODENOSCOPY     GASTRIC ROUX-EN-Y N/A 09/24/2014   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;  Surgeon: Luretha Murphy, MD;  Location: [facility];  Service: General;  Laterality: N/A;   TUBAL LIGATION     WISDOM TOOTH EXTRACTION Bilateral 10/23/2021    Family History  Problem Relation Age of Onset   Bipolar disorder Mother    Migraines Mother    Bipolar disorder Brother    Diabetes Maternal Aunt    Diabetes Maternal Aunt    Diabetes Maternal Aunt    Diabetes Paternal Uncle    Hypertrophic cardiomyopathy Paternal Grandmother 16   Hypertrophic cardiomyopathy Cousin 31   Diabetes Other     No Known Allergies  Current Outpatient Medications on File Prior to Visit  Medication Sig Dispense  Refill   [name] Take 1 tablet by mouth 3 (three) times daily.      clobetasol (TEMOVATE) 0.05 % external solution Apply 1 application. topically 2 (two) times daily as needed. 50 mL 0   Cyanocobalamin ([name]) 2500 MCG SUBL Place 2,500 mcg under the tongue daily.     DULoxetine (CYMBALTA) 60 MG capsule Take 1 capsule (60 mg total) by mouth daily. For fibromyalgia 90 capsule 0   metFORMIN (GLUCOPHAGE XR) 500 MG 24 hr tablet Take 1 tablet (500 mg total) by mouth daily with breakfast. For diabetes. 90 tablet 3   Multiple Vitamin (MULTIVITAMIN WITH MINERALS) TABS tablet Take 1 tablet by mouth daily.     triamcinolone ointment (KENALOG) 0.5 % Apply 1 application. topically 2 (two) times daily. 30 g 0   amoxicillin (AMOXIL) 500 MG capsule Take 500 mg by mouth 3 (three) times daily.     azelastine (ASTELIN) 0.1 % nasal spray USE 1 SPRAY IN BOTH NOSRILS TWICE DAILY (Patient not taking: Reported on 10/29/2021) 30 mL 0   cyclobenzaprine (FLEXERIL) 5 MG tablet Take 1 tablet (5 mg total) by mouth at bedtime as needed. For muscle spasms (Patient not taking: Reported on 08/19/2021) 90 tablet 0   ferrous sulfate 325 (65 FE) MG tablet Take 325 mg by mouth daily with breakfast. (Patient not taking: Reported on 10/29/2021)     fluticasone (FLONASE) 50 MCG/ACT nasal spray Place 2 sprays into both nostrils daily. (Patient not taking: Reported on 08/19/2021) 16 g 6   No current facility-administered medications on file prior to visit.    BP 108/74   Pulse 79   Temp (!) 97.5 F (36.4 C) (Oral)   Ht 5\' 2"  (1.575 m)   Wt 150 lb (68 kg)   SpO2 100%   BMI 27.44 kg/m  Objective:   Physical Exam HENT:     Right Ear: Tympanic membrane and ear canal normal.     Left Ear: Tympanic membrane and ear canal normal.     Nose:     Right Sinus: No maxillary sinus tenderness or frontal sinus tenderness.     Left Sinus: No maxillary sinus tenderness or frontal sinus tenderness.  Eyes:      Conjunctiva/sclera: Conjunctivae normal.  Cardiovascular:     Rate and Rhythm: Normal rate and regular rhythm.  Pulmonary:     Effort: Pulmonary effort is normal.     Breath sounds: Normal breath sounds. No wheezing or rales.  Musculoskeletal:     Cervical back: Neck supple.  Lymphadenopathy:     Cervical: No cervical  adenopathy.  Skin:    General: Skin is warm and dry.           Assessment & Plan:   Problem List Items Addressed This Visit       Other   Sinus pressure - Primary    Unlikely still bacterial at this point given her 6 day course of Amoxil 500 mg.  No double worsening. She appears well. Exam overall benign.   Continue Amoxil 500 mg TID until complete. Start Prednisone 40 mg daily x 5 days. Stop Allegra-D. Discussed potential side effect of rebound symptoms with prolonged use.  Discussed use of Flonase once prednisone is complete.  Return precautions provided.       Relevant Medications   predniSONE (DELTASONE) 20 MG tablet       Pleas Koch, NP

## 2021-10-29 NOTE — Patient Instructions (Signed)
Start prednisone 20 mg tablets. Take 2 tablets by mouth once daily for 5 days.  Stop taking Allegra-D.  Use Flonase nasal spray once you finish the prednisone.   It was a pleasure to see you today!

## 2021-11-07 ENCOUNTER — Inpatient Hospital Stay: Payer: BC Managed Care – PPO | Attending: Oncology

## 2021-11-07 DIAGNOSIS — Z79899 Other long term (current) drug therapy: Secondary | ICD-10-CM | POA: Insufficient documentation

## 2021-11-07 DIAGNOSIS — Z9884 Bariatric surgery status: Secondary | ICD-10-CM | POA: Insufficient documentation

## 2021-11-07 DIAGNOSIS — D509 Iron deficiency anemia, unspecified: Secondary | ICD-10-CM | POA: Insufficient documentation

## 2021-11-07 LAB — VITAMIN B12: Vitamin B-12: 2602 pg/mL — ABNORMAL HIGH (ref 180–914)

## 2021-11-07 LAB — IRON AND TIBC
Iron: 129 ug/dL (ref 28–170)
Saturation Ratios: 43 % — ABNORMAL HIGH (ref 10.4–31.8)
TIBC: 302 ug/dL (ref 250–450)
UIBC: 173 ug/dL

## 2021-11-07 LAB — CBC WITH DIFFERENTIAL/PLATELET
Abs Immature Granulocytes: 0.04 10*3/uL (ref 0.00–0.07)
Basophils Absolute: 0 10*3/uL (ref 0.0–0.1)
Basophils Relative: 0 %
Eosinophils Absolute: 0.1 10*3/uL (ref 0.0–0.5)
Eosinophils Relative: 1 %
HCT: 39.1 % (ref 36.0–46.0)
Hemoglobin: 13.3 g/dL (ref 12.0–15.0)
Immature Granulocytes: 1 %
Lymphocytes Relative: 25 %
Lymphs Abs: 1.9 10*3/uL (ref 0.7–4.0)
MCH: 30.4 pg (ref 26.0–34.0)
MCHC: 34 g/dL (ref 30.0–36.0)
MCV: 89.5 fL (ref 80.0–100.0)
Monocytes Absolute: 0.6 10*3/uL (ref 0.1–1.0)
Monocytes Relative: 8 %
Neutro Abs: 4.9 10*3/uL (ref 1.7–7.7)
Neutrophils Relative %: 65 %
Platelets: 304 10*3/uL (ref 150–400)
RBC: 4.37 MIL/uL (ref 3.87–5.11)
RDW: 12.6 % (ref 11.5–15.5)
WBC: 7.5 10*3/uL (ref 4.0–10.5)
nRBC: 0 % (ref 0.0–0.2)

## 2021-11-07 LAB — FERRITIN: Ferritin: 70 ng/mL (ref 11–307)

## 2021-11-07 LAB — TSH: TSH: 0.453 u[IU]/mL (ref 0.350–4.500)

## 2021-11-07 LAB — FOLATE: Folate: 17.4 ng/mL (ref >5.9)

## 2021-11-08 LAB — TISSUE TRANSGLUTAMINASE, IGA: Tissue Transglutaminase Ab, IgA: <2 U/mL (ref 0–3)

## 2021-11-10 LAB — GLIADIN ANTIBODIES, SERUM
Antigliadin Abs, IgA: 3 units (ref 0–19)
Gliadin IgG: 2 units (ref 0–19)

## 2021-11-12 ENCOUNTER — Inpatient Hospital Stay (HOSPITAL_BASED_OUTPATIENT_CLINIC_OR_DEPARTMENT_OTHER): Payer: BC Managed Care – PPO | Admitting: Oncology

## 2021-11-12 DIAGNOSIS — D509 Iron deficiency anemia, unspecified: Secondary | ICD-10-CM

## 2021-11-12 LAB — RETICULIN ANTIBODIES, IGA W TITER: Reticulin Ab, IgA: NEGATIVE titer (ref <2.5)

## 2021-11-20 ENCOUNTER — Encounter: Payer: Self-pay | Admitting: Oncology

## 2021-11-20 NOTE — Progress Notes (Signed)
I connected with Holly Farley on 11/20/21 at  3:15 PM EDT by video enabled telemedicine visit and verified that I am speaking with the correct person using two identifiers.   I discussed the limitations, risks, security and privacy concerns of performing an evaluation and management service by telemedicine and the availability of in-person appointments. I also discussed with the patient that there may be a patient responsible charge related to this service. The patient expressed understanding and agreed to proceed.  Other persons participating in the visit and their role in the encounter:  none  Patient's location:  home Provider's location:  work  Stage manager Complaint:  routine f/u of iron deficiency anemia  History of present illness:  Patient is a 41 year old female with a past medically history significant for gastric bypass surgery in 2016, polycystic ovarian disease, type 2 diabetes, fibromyalgia referred for iron deficiency anemia.  Her CBC from 07/31/2021 showed white count of 5.3, H&H of 13.9/41.8 and a platelet count of 260.  She was noted to have a hemoglobin of 9.2 about a year ago which subsequently improved.  Iron studies presently show a ferritin of 4.1 with an elevated TIBC of 466 with an iron saturation of 14%.   Note that her menstrual cycles can be heavy in the last for about 7 days.  She has 2 adult children and does not wish to have any more pregnancies.  No family history of colon cancer.  Denies any consistent use of NSAIDs.  She has ongoing neck pain and has a diagnosis of fibromyalgia and hopes to get back with neurology  Interval history still has ongoing fatigue although somewhat improved after receiving IV iron   Review of Systems  Constitutional:  Positive for malaise/fatigue. Negative for chills, fever and weight loss.  HENT:  Negative for congestion, ear discharge and nosebleeds.   Eyes:  Negative for blurred vision.  Respiratory:  Negative for cough, hemoptysis, sputum  production, shortness of breath and wheezing.   Cardiovascular:  Negative for chest pain, palpitations, orthopnea and claudication.  Gastrointestinal:  Negative for abdominal pain, blood in stool, constipation, diarrhea, heartburn, melena, nausea and vomiting.  Genitourinary:  Negative for dysuria, flank pain, frequency, hematuria and urgency.  Musculoskeletal:  Negative for back pain, joint pain and myalgias.  Skin:  Negative for rash.  Neurological:  Negative for dizziness, tingling, focal weakness, seizures, weakness and headaches.  Endo/Heme/Allergies:  Does not bruise/bleed easily.  Psychiatric/Behavioral:  Negative for depression and suicidal ideas. The patient does not have insomnia.     No Known Allergies  Past Medical History:  Diagnosis Date   Acute bilateral thoracic back pain 03/03/2018   Diabetes mellitus without complication (HCC)    Fever and chills 07/04/2018   Fibromyalgia affecting shoulder region    pt has it at right neck and right shoulder   History of diet-controlled diabetes 01/13/2018   Insulin resistance    Migraines    PCOS (polycystic ovarian syndrome)    with infertility, followed by [facility]   RMSF [facility] spotted fever) 09/01/2018   Syncope 05/16/2018   Weight loss, unintentional 01/13/2018    Past Surgical History:  Procedure Laterality Date   CESAREAN SECTION     ESOPHAGOGASTRODUODENOSCOPY     GASTRIC ROUX-EN-Y N/A 09/24/2014   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;  Surgeon: Luretha Murphy, MD;  Location: [facility];  Service: General;  Laterality: N/A;   TUBAL LIGATION     WISDOM TOOTH EXTRACTION Bilateral 10/23/2021  Social History   Socioeconomic History   Marital status: Married    Spouse name: Not on file   Number of children: 2   Years of education: Not on file   Highest education level: Bachelor's degree (e.g., BA, AB, BS)  Occupational History   Not on file  Tobacco Use   Smoking status:  Former    Packs/day: 0.75    Years: 7.00    Total pack years: 5.25    Types: Cigarettes    Start date: 2002    Quit date: 06/06/2007    Years since quitting: 14.4   Smokeless tobacco: Never  Vaping Use   Vaping Use: Never used  Substance and Sexual Activity   Alcohol use: No   Drug use: No   Sexual activity: Yes    Birth control/protection: Surgical    Comment: tubal ligation  Other Topics Concern   Not on file  Social History Narrative   Caffeine- 300 mg daily   Social Determinants of Health   Financial Resource Strain: Not on file  Food Insecurity: Not on file  Transportation Needs: Not on file  Physical Activity: Not on file  Stress: Not on file  Social Connections: Not on file  Intimate Partner Violence: Not on file    Family History  Problem Relation Age of Onset   Bipolar disorder Mother    Migraines Mother    Bipolar disorder Brother    Diabetes Maternal Aunt    Diabetes Maternal Aunt    Diabetes Maternal Aunt    Diabetes Paternal Uncle    Hypertrophic cardiomyopathy Paternal Grandmother 78   Hypertrophic cardiomyopathy Cousin 31   Diabetes Other      Current Outpatient Medications:    amoxicillin (AMOXIL) 500 MG capsule, Take 500 mg by mouth 3 (three) times daily., Disp: , Rfl:    azelastine (ASTELIN) 0.1 % nasal spray, USE 1 SPRAY IN BOTH NOSRILS TWICE DAILY (Patient not taking: Reported on 10/29/2021), Disp: 30 mL, Rfl: 0   [name], Take 1 tablet by mouth 3 (three) times daily. , Disp: , Rfl:    clobetasol (TEMOVATE) 0.05 % external solution, Apply 1 application. topically 2 (two) times daily as needed., Disp: 50 mL, Rfl: 0   Cyanocobalamin ([name]) 2500 MCG SUBL, Place 2,500 mcg under the tongue daily., Disp: , Rfl:    cyclobenzaprine (FLEXERIL) 5 MG tablet, Take 1 tablet (5 mg total) by mouth at bedtime as needed. For muscle spasms (Patient not taking: Reported on 08/19/2021), Disp: 90 tablet, Rfl: 0   DULoxetine  (CYMBALTA) 60 MG capsule, Take 1 capsule (60 mg total) by mouth daily. For fibromyalgia, Disp: 90 capsule, Rfl: 0   ferrous sulfate 325 (65 FE) MG tablet, Take 325 mg by mouth daily with breakfast. (Patient not taking: Reported on 10/29/2021), Disp: , Rfl:    fluticasone (FLONASE) 50 MCG/ACT nasal spray, Place 2 sprays into both nostrils daily. (Patient not taking: Reported on 08/19/2021), Disp: 16 g, Rfl: 6   metFORMIN (GLUCOPHAGE XR) 500 MG 24 hr tablet, Take 1 tablet (500 mg total) by mouth daily with breakfast. For diabetes., Disp: 90 tablet, Rfl: 3   Multiple Vitamin (MULTIVITAMIN WITH MINERALS) TABS tablet, Take 1 tablet by mouth daily., Disp: , Rfl:    predniSONE (DELTASONE) 20 MG tablet, Take 2 tablets by mouth once daily for 5 days., Disp: 10 tablet, Rfl: 0   triamcinolone ointment (KENALOG) 0.5 %, Apply 1 application. topically 2 (two) times  daily., Disp: 30 g, Rfl: 0  No results found.  No images are attached to the encounter.      Latest Ref Rng & Units 07/31/2021    9:00 AM  CMP  Glucose 70 - 99 mg/dL 967   BUN 6 - 23 mg/dL 10   Creatinine 8.93 - 1.20 mg/dL 8.10   Sodium 175 - 102 mEq/L 136   Potassium 3.5 - 5.1 mEq/L 4.7   Chloride 96 - 112 mEq/L 101   CO2 19 - 32 mEq/L 28   Calcium 8.4 - 10.5 mg/dL 9.7   Total Protein 6.0 - 8.3 g/dL 7.1   Total Bilirubin 0.2 - 1.2 mg/dL 0.6   Alkaline Phos 39 - 117 U/L 43   AST 0 - 37 U/L 22   ALT 0 - 35 U/L 23       Latest Ref Rng & Units 11/07/2021    1:38 PM  CBC  WBC 4.0 - 10.5 K/uL 7.5   Hemoglobin 12.0 - 15.0 g/dL 58.5   Hematocrit 27.7 - 46.0 % 39.1   Platelets 150 - 400 K/uL 304      Observation/objective: No acute distress over video visit today.  Breathing is nonlabored  Assessment and plan:Patient 41 year old female and this is a routine follow-up visit for iron deficiency anemia  After receiving IV iron patient's hemoglobin has improved from 9.2-13.3.  Ferritin levels are now normal at 70 with normal iron studies.   B12 and folate are unremarkable.  Elevated B12 likely secondary to multivitamin supplementation.  I will repeat CBC ferritin and iron studies in 4 months and 8 months and see her back in 8 months  Follow-up instructions: As above  I discussed the assessment and treatment plan with the patient. The patient was provided an opportunity to ask questions and all were answered. The patient agreed with the plan and demonstrated an understanding of the instructions.   The patient was advised to call back or seek an in-person evaluation if the symptoms worsen or if the condition fails to improve as anticipated.  I provided 12 minutes of face-to-face video visit time during this encounter, and > 50% was spent counseling as documented under my assessment & plan.  Visit Diagnosis: 1. Iron deficiency anemia, unspecified iron deficiency anemia type     Dr. Owens Shark, MD, MPH [facility] Tel- [phone] 11/20/2021 11:25 AM

## 2022-02-02 IMAGING — US US PELVIS COMPLETE WITH TRANSVAGINAL
1 series · 13 of 25 positions shown · non-contrast
Comparison: None

CLINICAL DATA: Anemia, menorrhagia

EXAM:
TRANSABDOMINAL AND TRANSVAGINAL ULTRASOUND OF PELVIS
TECHNIQUE: Both transabdominal and transvaginal ultrasound examinations of the
pelvis were performed. Transabdominal technique was performed for
global imaging of the pelvis including uterus, ovaries, adnexal
regions, and pelvic cul-de-sac. It was necessary to proceed with
endovaginal exam following the transabdominal exam to visualize the
endometrium and bilateral ovaries.

[Series 1: us pelvis complete with transvaginal · 0.20mm/px · 13 of 101 slices shown]
[im 1/101]
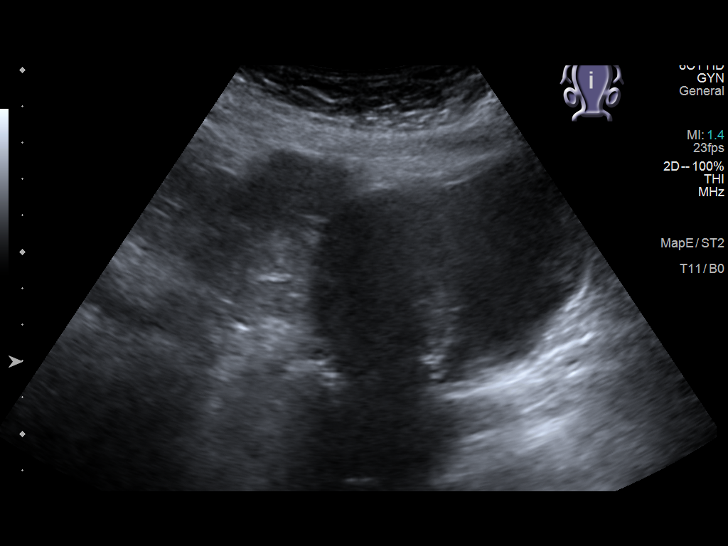
[im 9/101]
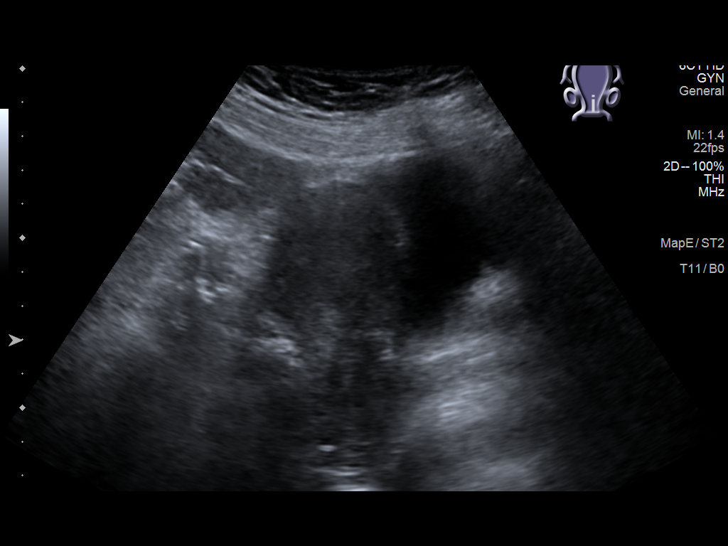
[im 17/101]
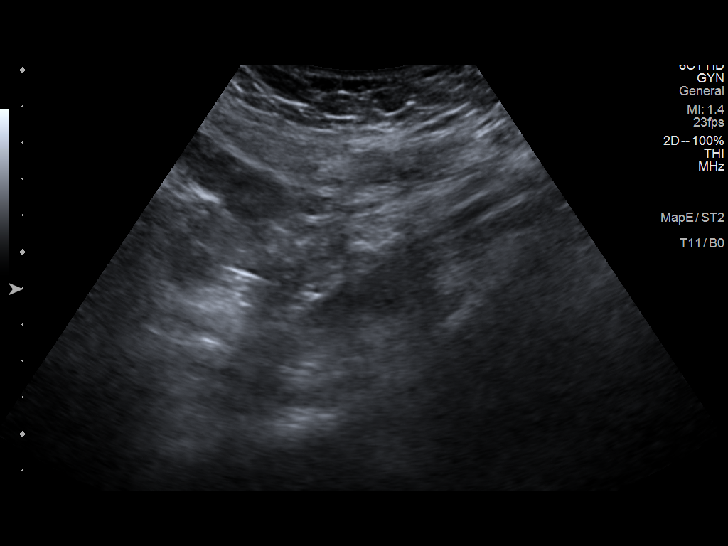
[im 26/101]
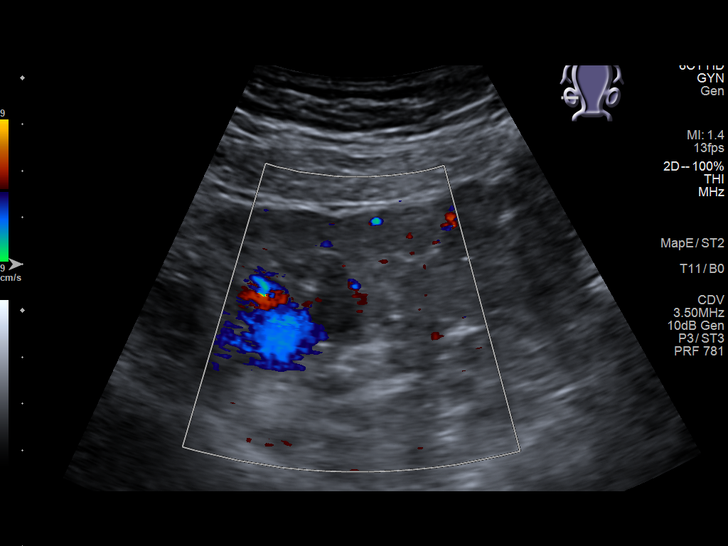
[im 34/101]
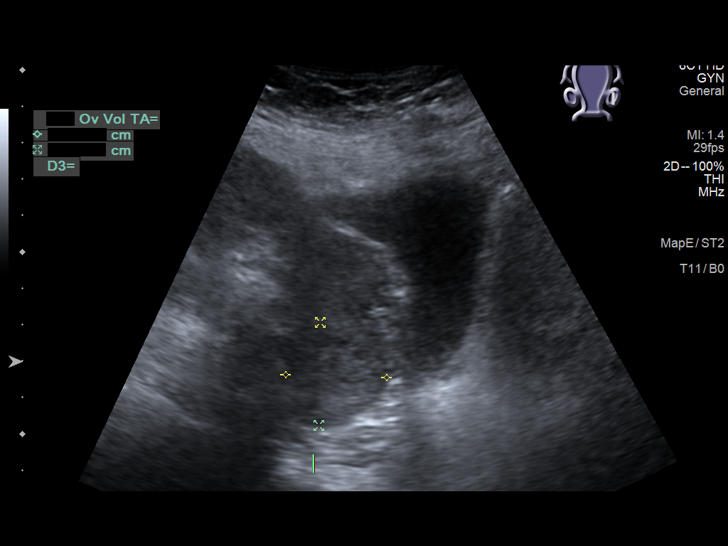
[im 42/101]
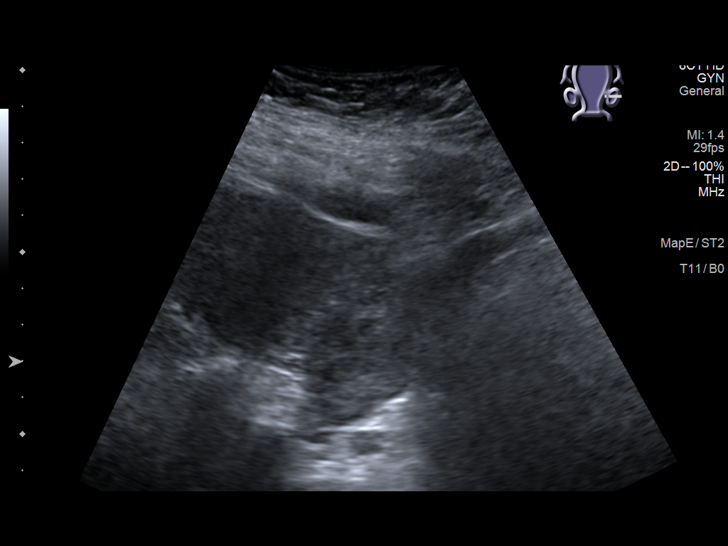
[im 51/101]
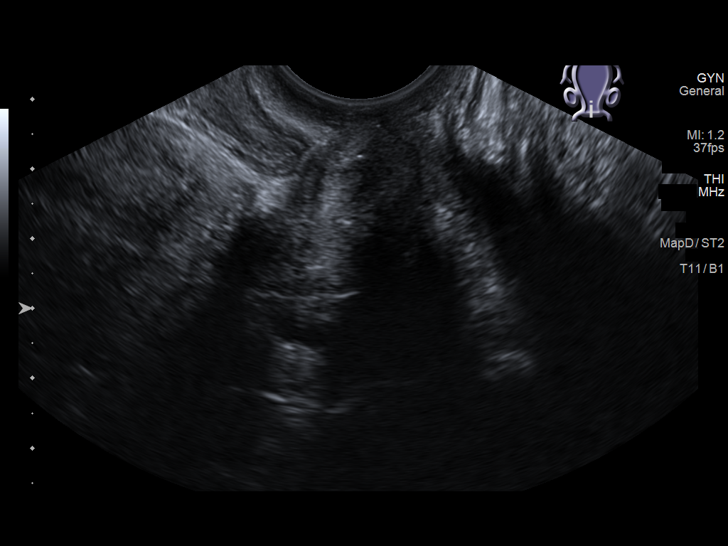
[im 59/101]
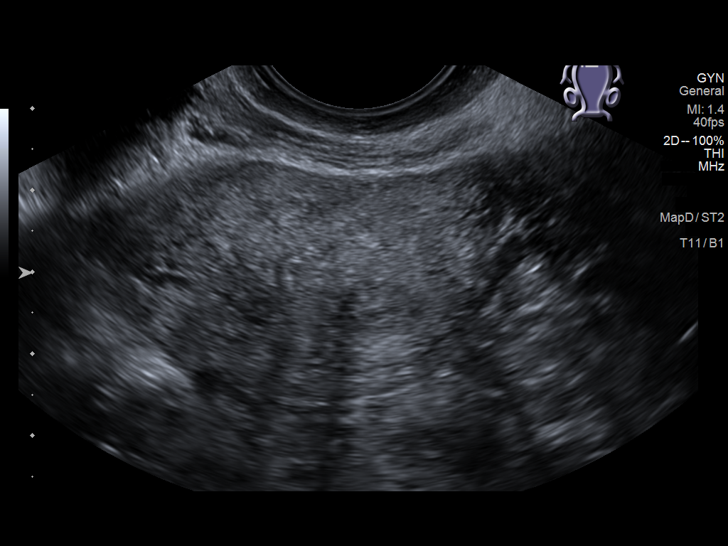
[im 67/101]
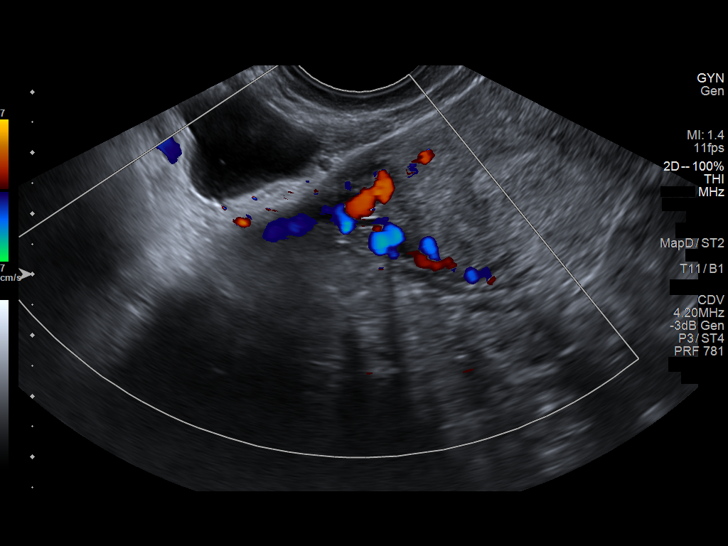
[im 76/101]
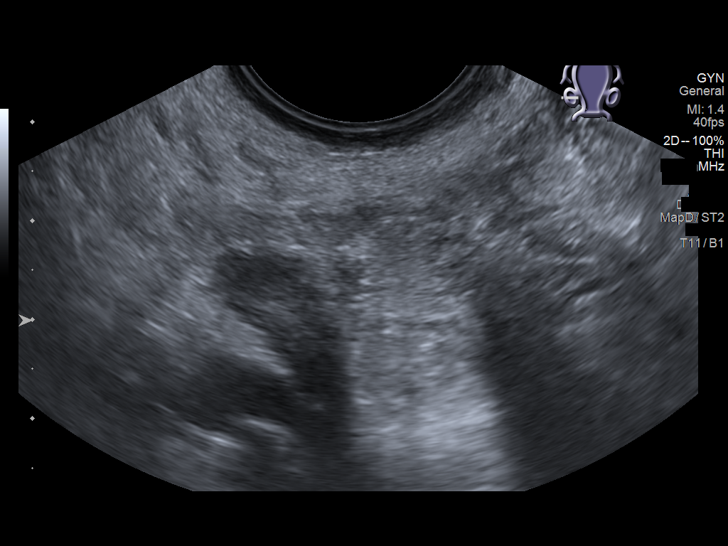
[im 84/101]
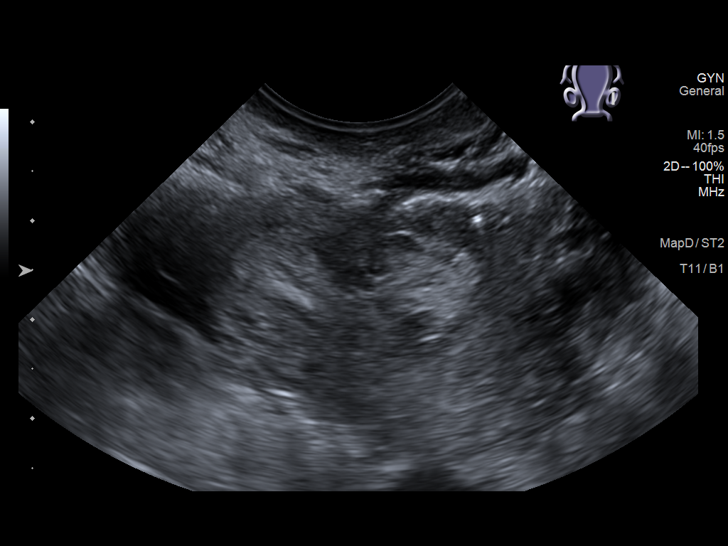
[im 92/101]
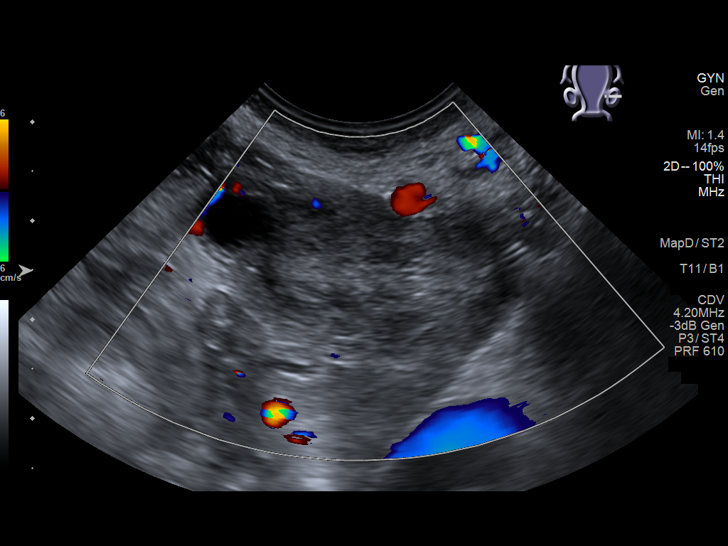
[im 101/101]
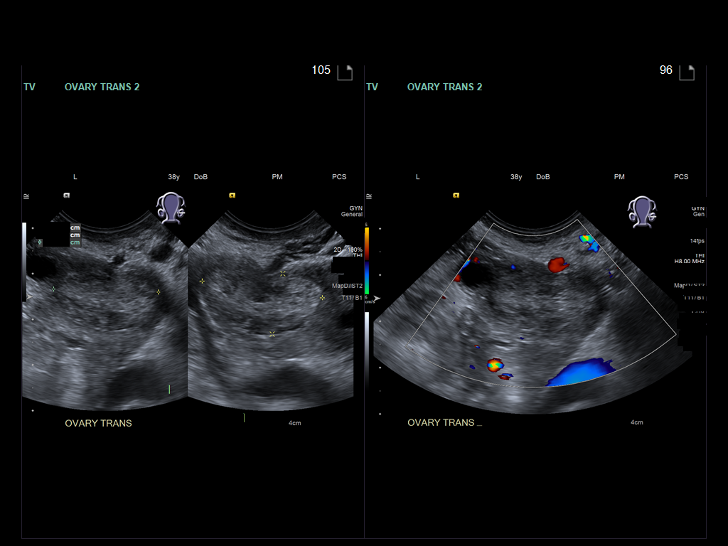

[13 of 25 positions shown; findings below may reference images not displayed]

FINDINGS: Uterus

Measurements: 8.2 x 3.7 by 4.6 cm = volume: 71.2 mL. No fibroids or
other mass visualized.

Endometrium

Thickness: 6 mm. No focal abnormality visualized. Nabothian cysts
seen within the cervix.

Right ovary

Measurements: 3.3 x 1.2 x 1.2 cm = volume: 2.6 mL. Normal
appearance/no adnexal mass.

Left ovary

Measurements: 3.0 x 2.8 x 4.7 cm = volume: 20.4 mL. There is a 2.7 x
1.4 x 2.3 cm heterogeneous mildly hyperechoic structure within the
left ovary, most likely hemorrhagic cyst in a patient of this age.
Follow-up ultrasound in 6-12 weeks is recommended for documentation
of resolution.

Other findings

No abnormal free fluid.
IMPRESSION: 1. Likely hemorrhagic cyst within the left ovary, recommend
follow-up pelvic ultrasound in 6-12 weeks to document resolution.
2. Otherwise unremarkable pelvic ultrasound. Normal appearance of
the endometrium.

## 2022-04-20 IMAGING — US US PELVIS COMPLETE WITH TRANSVAGINAL
1 series · 13 of 25 positions shown · non-contrast
Comparison: Pelvic ultrasound 08/14/2020

CLINICAL DATA: Ovarian cyst follow-up

EXAM:
TRANSABDOMINAL AND TRANSVAGINAL ULTRASOUND OF PELVIS
TECHNIQUE: Both transabdominal and transvaginal ultrasound examinations of the
pelvis were performed. Transabdominal technique was performed for
global imaging of the pelvis including uterus, ovaries, adnexal
regions, and pelvic cul-de-sac. It was necessary to proceed with
endovaginal exam following the transabdominal exam to visualize the
uterus endometrium ovaries.

[Series 1: us pelvis complete with transvaginal · 0.21mm/px · 44 acquisitions, 13 frames shown]
[im 1/44]
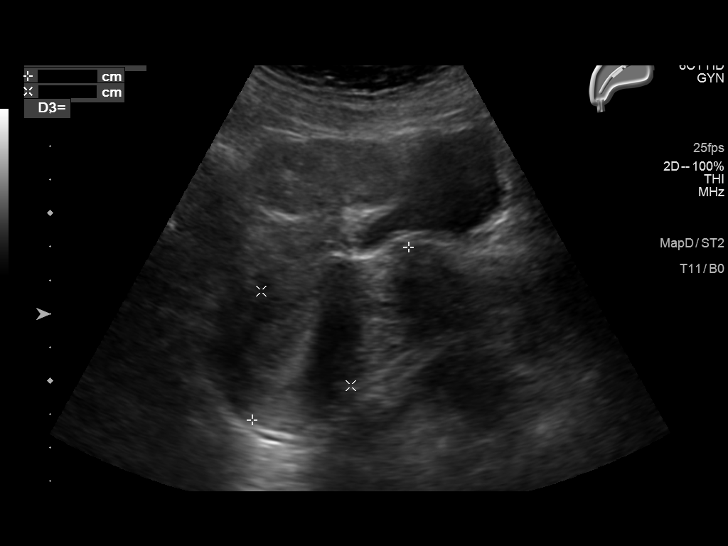
[im 4/44]
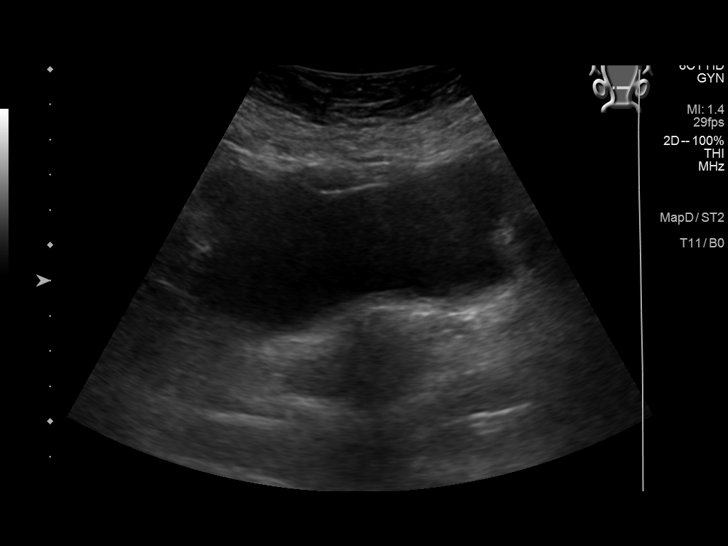
[im 8/44]
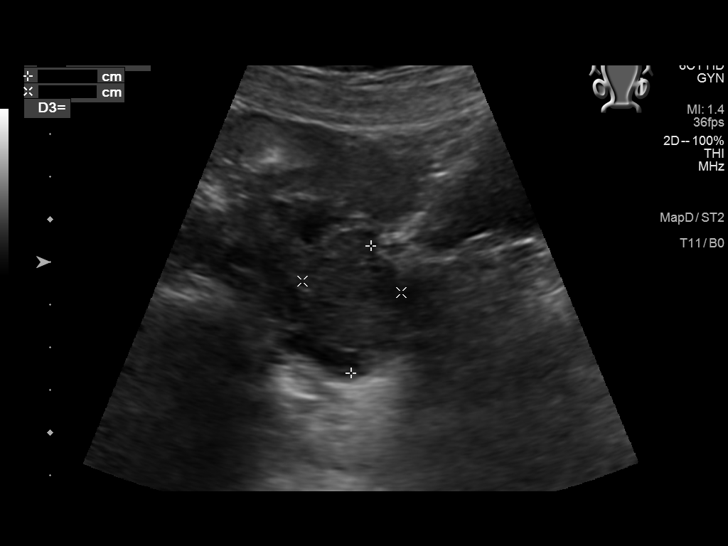
[im 11/44]
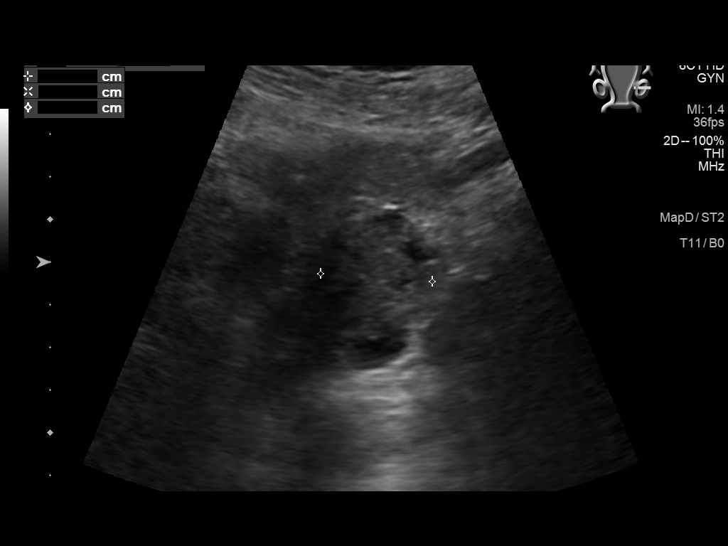
[im 15/44]
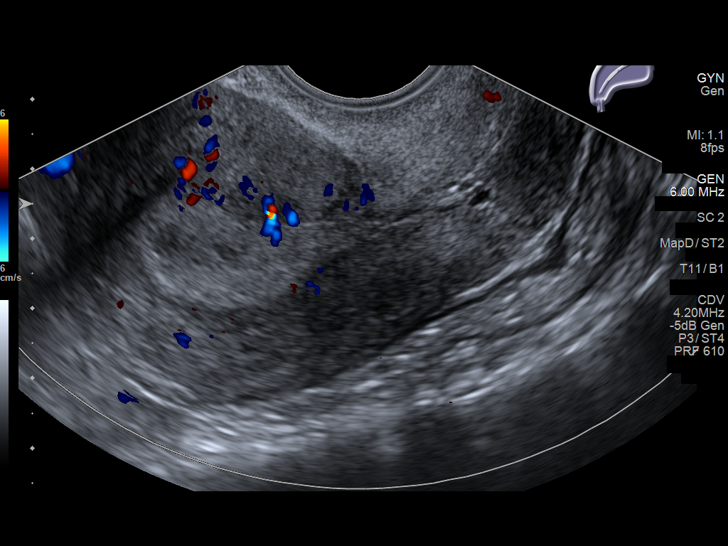
[im 18/44]
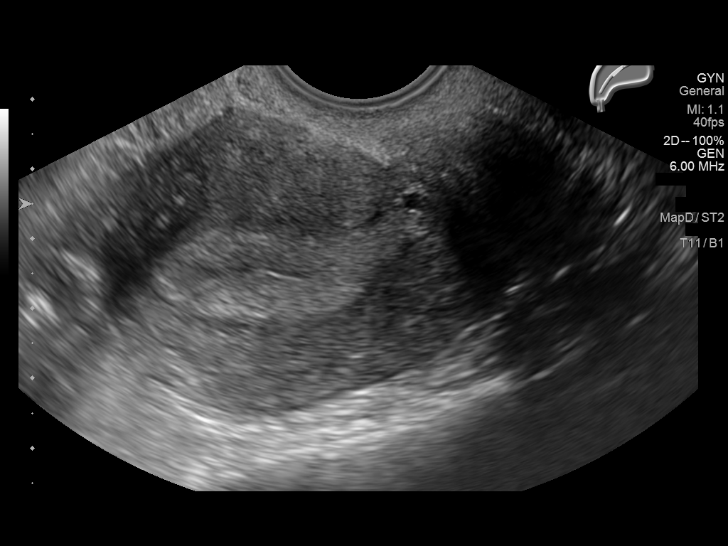
[im 22/44]
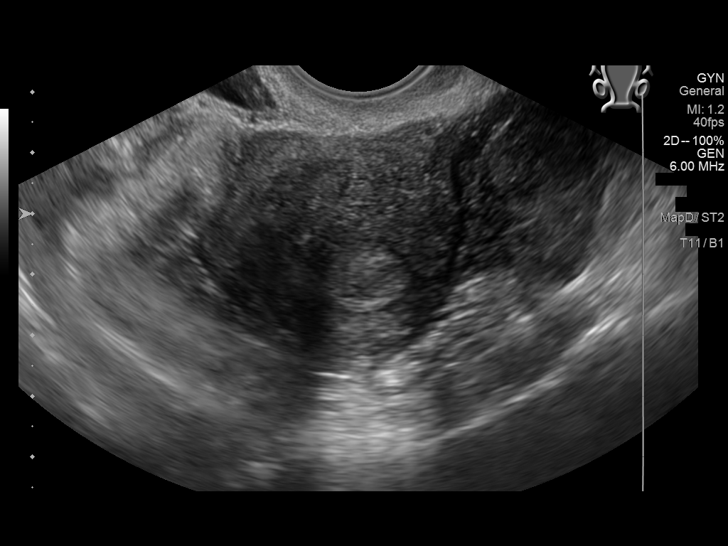
[im 26/44]
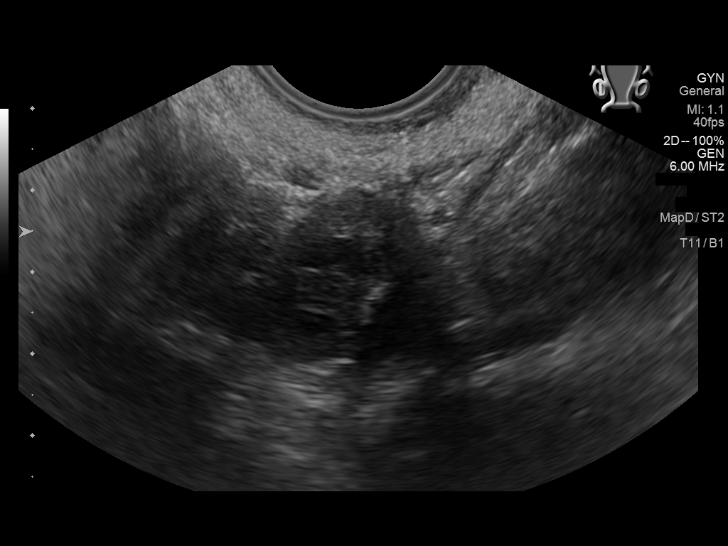
[im 29/44]
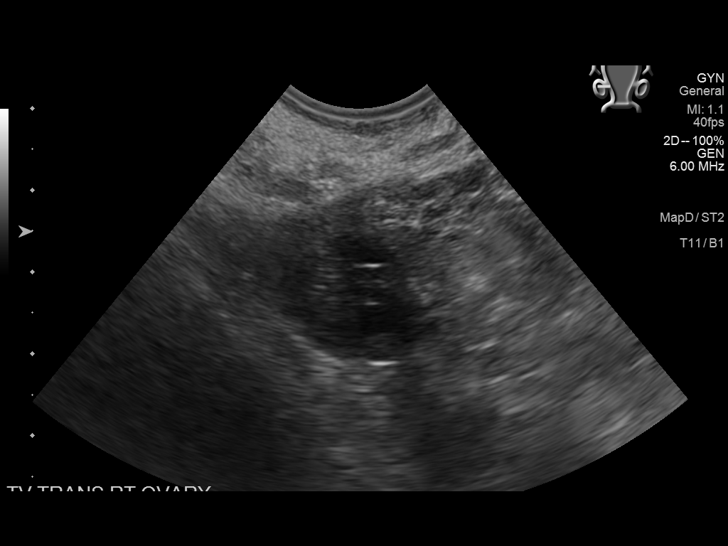
[im 33/44]
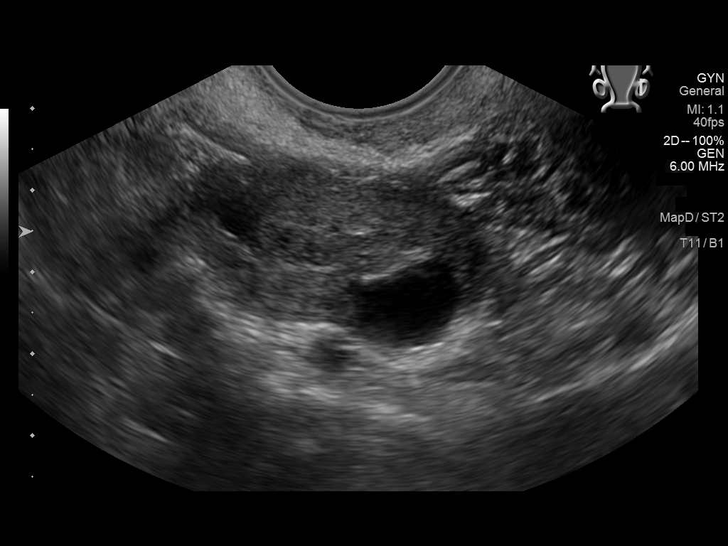
[im 36/44]
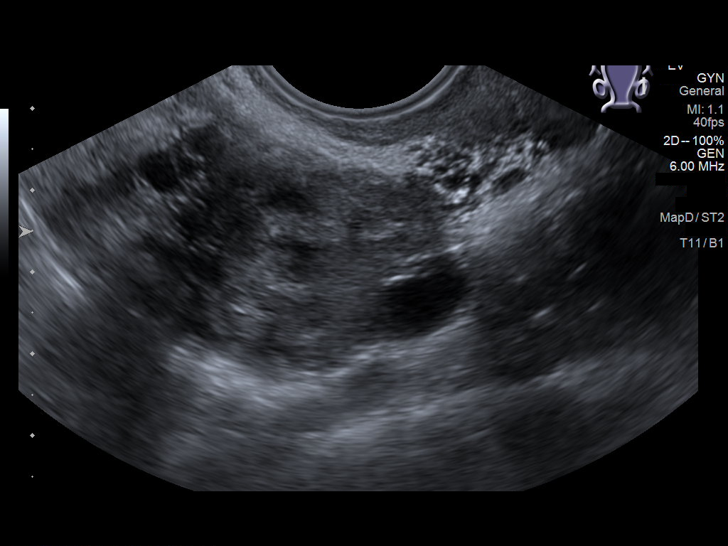
[im 40/44]
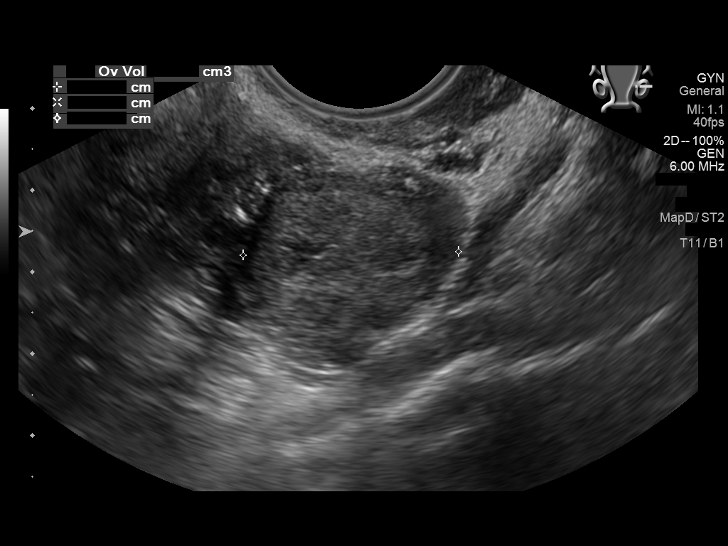
[im 44/44]
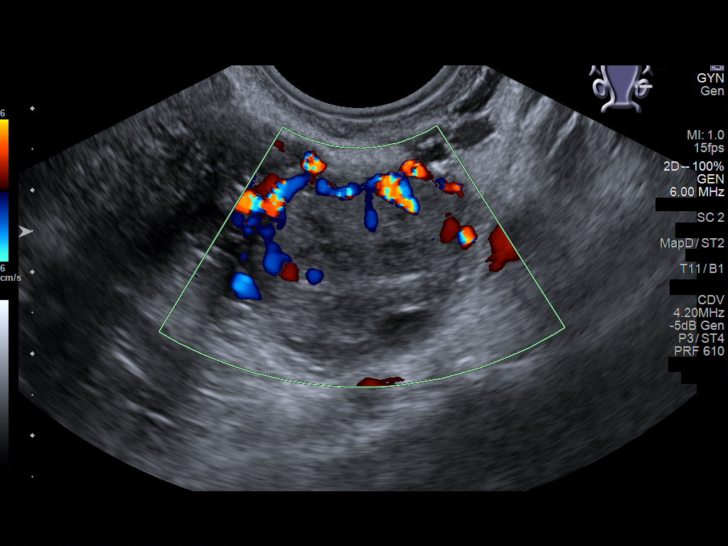

[13 of 25 positions shown; findings below may reference images not displayed]

FINDINGS: Uterus

Measurements: 7.6 x 4.6 x 5.7 cm = volume: 104 mL. No fibroids or
other mass visualized.

Endometrium

Thickness: 13.5 mm.  No focal abnormality visualized.

Right ovary

Measurements: 2.2 x 1.2 x 1.6 cm = volume: 2.2 mL. Normal
appearance/no adnexal mass.

Left ovary

Measurements: 3.3 x 1.8 x 2.6 cm = volume: 8.3 mL. Complex cyst
measuring 2.2 x 1.2 x 2.1 cm, previously 2.7 x 1.4 x 2.3 cm

Other findings

No abnormal free fluid.
IMPRESSION: 1. Decreased size of complex left ovarian cyst, now measuring
cm, previously 2.7 cm and presumably representing resolving
hemorrhagic cyst or corpus luteum.
2. Otherwise negative pelvic ultrasound

## 2022-06-15 ENCOUNTER — Encounter: Payer: Self-pay | Admitting: Oncology

## 2022-07-30 ENCOUNTER — Encounter: Payer: Self-pay | Admitting: Oncology

## 2022-08-13 ENCOUNTER — Encounter: Payer: Self-pay | Admitting: Oncology

## 2022-09-28 ENCOUNTER — Encounter: Payer: Self-pay | Admitting: Oncology

## 2022-10-13 ENCOUNTER — Encounter: Payer: Self-pay | Admitting: Oncology

## 2022-10-26 ENCOUNTER — Encounter: Payer: Self-pay | Admitting: Oncology

## 2022-11-12 ENCOUNTER — Encounter: Payer: Self-pay | Admitting: Oncology

## 2022-11-19 ENCOUNTER — Encounter: Payer: Self-pay | Admitting: Oncology

## 2022-12-04 ENCOUNTER — Encounter: Payer: Self-pay | Admitting: Oncology

## 2022-12-07 ENCOUNTER — Encounter: Payer: Self-pay | Admitting: Oncology

## 2022-12-25 ENCOUNTER — Ambulatory Visit: Payer: BC Managed Care – PPO | Admitting: Primary Care

## 2023-02-09 ENCOUNTER — Other Ambulatory Visit: Payer: Self-pay | Admitting: Primary Care

## 2023-02-10 ENCOUNTER — Encounter: Payer: Self-pay | Admitting: Oncology

## 2023-02-11 ENCOUNTER — Encounter: Payer: Self-pay | Admitting: Oncology

## 2023-03-01 ENCOUNTER — Encounter: Payer: Self-pay | Admitting: Oncology

## 2023-09-09 ENCOUNTER — Encounter: Payer: Self-pay | Admitting: Oncology

## 2023-09-17 ENCOUNTER — Ambulatory Visit: Admitting: Primary Care

## 2023-09-17 ENCOUNTER — Encounter: Payer: Self-pay | Admitting: Primary Care

## 2023-09-17 VITALS — BP 120/76 | HR 86 | Temp 98.6°F | Ht 62.0 in | Wt 149.0 lb

## 2023-09-17 DIAGNOSIS — Z0001 Encounter for general adult medical examination with abnormal findings: Secondary | ICD-10-CM | POA: Diagnosis not present

## 2023-09-17 DIAGNOSIS — Z7984 Long term (current) use of oral hypoglycemic drugs: Secondary | ICD-10-CM

## 2023-09-17 DIAGNOSIS — G479 Sleep disorder, unspecified: Secondary | ICD-10-CM

## 2023-09-17 DIAGNOSIS — E1165 Type 2 diabetes mellitus with hyperglycemia: Secondary | ICD-10-CM

## 2023-09-17 DIAGNOSIS — D508 Other iron deficiency anemias: Secondary | ICD-10-CM | POA: Diagnosis not present

## 2023-09-17 DIAGNOSIS — M797 Fibromyalgia: Secondary | ICD-10-CM

## 2023-09-17 DIAGNOSIS — N920 Excessive and frequent menstruation with regular cycle: Secondary | ICD-10-CM

## 2023-09-17 DIAGNOSIS — Z7985 Long-term (current) use of injectable non-insulin antidiabetic drugs: Secondary | ICD-10-CM

## 2023-09-17 LAB — CBC
HCT: 40 % (ref 36.0–46.0)
Hemoglobin: 13.1 g/dL (ref 12.0–15.0)
MCHC: 32.8 g/dL (ref 30.0–36.0)
MCV: 83.2 fl (ref 78.0–100.0)
Platelets: 263 K/uL (ref 150.0–400.0)
RBC: 4.81 Mil/uL (ref 3.87–5.11)
RDW: 17.9 % — ABNORMAL HIGH (ref 11.5–15.5)
WBC: 5.9 K/uL (ref 4.0–10.5)

## 2023-09-17 LAB — LIPID PANEL
Cholesterol: 152 mg/dL (ref 0–200)
HDL: 64.4 mg/dL (ref >39.00)
LDL Cholesterol: 67 mg/dL (ref 0–99)
NonHDL: 88.01
Total CHOL/HDL Ratio: 2
Triglycerides: 105 mg/dL (ref 0.0–149.0)
VLDL: 21 mg/dL (ref 0.0–40.0)

## 2023-09-17 LAB — COMPREHENSIVE METABOLIC PANEL WITH GFR
ALT: 17 U/L (ref 0–35)
AST: 17 U/L (ref 0–37)
Albumin: 4.3 g/dL (ref 3.5–5.2)
Alkaline Phosphatase: 38 U/L — ABNORMAL LOW (ref 39–117)
BUN: 11 mg/dL (ref 6–23)
CO2: 29 meq/L (ref 19–32)
Calcium: 9.2 mg/dL (ref 8.4–10.5)
Chloride: 103 meq/L (ref 96–112)
Creatinine, Ser: 0.72 mg/dL (ref 0.40–1.20)
GFR: 103.87 mL/min (ref >60.00)
Glucose, Bld: 146 mg/dL — ABNORMAL HIGH (ref 70–99)
Potassium: 4.6 meq/L (ref 3.5–5.1)
Sodium: 139 meq/L (ref 135–145)
Total Bilirubin: 0.4 mg/dL (ref 0.2–1.2)
Total Protein: 6.4 g/dL (ref 6.0–8.3)

## 2023-09-17 LAB — MICROALBUMIN / CREATININE URINE RATIO
Creatinine,U: 69.4 mg/dL
Microalb Creat Ratio: 20.4 mg/g (ref 0.0–30.0)
Microalb, Ur: 1.4 mg/dL (ref 0.0–1.9)

## 2023-09-17 LAB — FERRITIN: Ferritin: 8.8 ng/mL — ABNORMAL LOW (ref 10.0–291.0)

## 2023-09-17 LAB — HEMOGLOBIN A1C: Hgb A1c MFr Bld: 6.2 % (ref 4.6–6.5)

## 2023-09-17 MED ORDER — ESCITALOPRAM OXALATE 5 MG PO TABS: 5.0000 mg | ORAL_TABLET | Freq: Every day | ORAL | 0 refills | Status: AC

## 2023-09-17 NOTE — Patient Instructions (Signed)
 Stop by the lab prior to leaving today. I will notify you of your results once received.   [facility].   Start Lexapro 5 mg tablets once daily for anxiety/sleep.  Please update me in about 1 month.  It was a pleasure to see you today!

## 2023-09-17 NOTE — Assessment & Plan Note (Signed)
 Chronic, slightly improved.   Repeat ferritin and CBC pending.

## 2023-09-17 NOTE — Assessment & Plan Note (Signed)
 Stable.  Remain off Cymbalta . Continue follow-up with chiropractor.

## 2023-09-17 NOTE — Assessment & Plan Note (Signed)
Immunizations UTD. Pap smear UTD. Mammogram due, orders placed.  Discussed the importance of a healthy diet and regular exercise in order for weight loss, and to reduce the risk of further co-morbidity.  Exam stable. Labs pending.  Follow up in 1 year for repeat physical.

## 2023-09-17 NOTE — Assessment & Plan Note (Addendum)
 Off treatment for >1 year.  Repeat A1C pending. Urine microalbumin due and pending.   Consider resuming metformin  if warranted.
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
## 2023-09-17 NOTE — Assessment & Plan Note (Signed)
 No iron  supplement for > 30 days.  Repeat ferritin and CBC pending.

## 2023-09-19 ENCOUNTER — Ambulatory Visit: Payer: Self-pay | Admitting: Primary Care

## 2023-09-19 DIAGNOSIS — D508 Other iron deficiency anemias: Secondary | ICD-10-CM

## 2023-09-19 DIAGNOSIS — E1165 Type 2 diabetes mellitus with hyperglycemia: Secondary | ICD-10-CM

## 2023-09-20 MED ORDER — OZEMPIC (0.25 OR 0.5 MG/DOSE) 2 MG/3ML ~~LOC~~ SOPN: PEN_INJECTOR | SUBCUTANEOUS | 0 refills | Status: DC

## 2023-09-21 ENCOUNTER — Encounter: Payer: Self-pay | Admitting: Oncology

## 2023-09-21 ENCOUNTER — Telehealth: Payer: Self-pay

## 2023-09-21 ENCOUNTER — Other Ambulatory Visit (HOSPITAL_COMMUNITY): Payer: Self-pay

## 2023-09-21 DIAGNOSIS — E1165 Type 2 diabetes mellitus with hyperglycemia: Secondary | ICD-10-CM

## 2023-09-21 NOTE — Telephone Encounter (Signed)
 Pharmacy Patient Advocate Encounter   Received notification from CoverMyMeds that prior authorization for Ozempic  2 is required/requested.   Insurance verification completed.   The patient is insured through [facility] .   Per test claim: The current 84 day co-pay is, $49.99.  No PA needed at this time. This test claim was processed through [facility]- copay amounts may vary at other pharmacies due to pharmacy/plan contracts, or as the patient moves through the different stages of their insurance plan.

## 2023-09-22 ENCOUNTER — Other Ambulatory Visit (HOSPITAL_COMMUNITY): Payer: Self-pay

## 2023-09-22 ENCOUNTER — Encounter: Payer: Self-pay | Admitting: Oncology

## 2023-09-22 ENCOUNTER — Other Ambulatory Visit: Payer: Self-pay

## 2023-09-22 MED ORDER — OZEMPIC (0.25 OR 0.5 MG/DOSE) 2 MG/3ML ~~LOC~~ SOPN
PEN_INJECTOR | SUBCUTANEOUS | 0 refills | Status: DC
  Filled 2023-09-22: qty 6, 56d supply, fill #0

## 2023-09-22 NOTE — Addendum Note (Signed)
 Addended by: Griffin Dewilde K on: 09/22/2023 04:19 PM   Modules accepted: Orders

## 2023-09-22 NOTE — Telephone Encounter (Signed)
 Copied from CRM [id number]. Topic: Clinical - Medication Prior Auth >> Sep 21, 2023  4:40 PM Mia F wrote: Reason for CRM: Pt is following up on the PA needed for her the Semaglutide ,0.25 or 0.5MG /DOS, (OZEMPIC , 0.25 OR 0.5 MG/DOSE,) 2 MG/3ML SOPN that was prescribed to her. Pharmacy requested a PA this morning   [facility] [employer]  Address: [address] [place], [place] [address] Phone: [phone]

## 2023-09-22 NOTE — Telephone Encounter (Signed)
 Re[facility] stating they are unable to run the prescription due to it stated a PA is still needed. PA team please take another look at this.

## 2023-09-22 NOTE — Telephone Encounter (Signed)
 Please notify patient that the Ozempic  prescription was sent to [facility] pharmacy.

## 2023-10-13 ENCOUNTER — Other Ambulatory Visit (HOSPITAL_COMMUNITY): Payer: Self-pay

## 2023-10-26 ENCOUNTER — Other Ambulatory Visit (HOSPITAL_COMMUNITY): Payer: Self-pay

## 2023-10-26 NOTE — Telephone Encounter (Signed)
 Pharmacy Patient Advocate Encounter  Received notification from [facility] that Prior Authorization for Ozempic  (0.25 or 0.5 MG/DOSE) 2MG /3ML pen-injectors  has been APPROVED from 10/26/23 to 10/26/26   PA #/Case ID/Reference #: [id number]

## 2023-11-19 ENCOUNTER — Ambulatory Visit: Admitting: Primary Care

## 2023-12-06 ENCOUNTER — Other Ambulatory Visit: Payer: Self-pay | Admitting: Primary Care

## 2023-12-06 ENCOUNTER — Other Ambulatory Visit: Payer: Self-pay

## 2023-12-06 DIAGNOSIS — E1165 Type 2 diabetes mellitus with hyperglycemia: Secondary | ICD-10-CM

## 2023-12-06 MED FILL — Semaglutide Soln Pen-inj 0.25 or 0.5 MG/DOSE (2 MG/3ML): SUBCUTANEOUS | 28 days supply | Qty: 3 | Fill #0 | Status: AC

## 2023-12-06 NOTE — Telephone Encounter (Signed)
 Patient is due for diabetes follow up now, this will be required prior to any further refills.  Please schedule, thank you!

## 2023-12-07 ENCOUNTER — Ambulatory Visit: Payer: Self-pay | Admitting: Primary Care

## 2023-12-07 ENCOUNTER — Other Ambulatory Visit (INDEPENDENT_AMBULATORY_CARE_PROVIDER_SITE_OTHER)

## 2023-12-07 DIAGNOSIS — D508 Other iron deficiency anemias: Secondary | ICD-10-CM | POA: Diagnosis not present

## 2023-12-07 LAB — FERRITIN: Ferritin: 9 ng/mL — ABNORMAL LOW (ref 10.0–291.0)

## 2023-12-08 ENCOUNTER — Other Ambulatory Visit: Payer: Self-pay

## 2023-12-22 ENCOUNTER — Other Ambulatory Visit (HOSPITAL_COMMUNITY): Payer: Self-pay

## 2023-12-22 ENCOUNTER — Encounter: Payer: Self-pay | Admitting: Primary Care

## 2023-12-22 ENCOUNTER — Ambulatory Visit: Admitting: Primary Care

## 2023-12-22 ENCOUNTER — Ambulatory Visit: Payer: Self-pay | Admitting: Primary Care

## 2023-12-22 VITALS — BP 102/64 | HR 93 | Temp 97.4°F | Ht 62.0 in | Wt 138.0 lb

## 2023-12-22 DIAGNOSIS — E1165 Type 2 diabetes mellitus with hyperglycemia: Secondary | ICD-10-CM

## 2023-12-22 DIAGNOSIS — Z7985 Long-term (current) use of injectable non-insulin antidiabetic drugs: Secondary | ICD-10-CM | POA: Diagnosis not present

## 2023-12-22 LAB — POCT GLYCOSYLATED HEMOGLOBIN (HGB A1C): Hemoglobin A1C: 5.1 % (ref 4.0–5.6)

## 2023-12-22 MED ORDER — BLOOD GLUCOSE MONITORING SUPPL DEVI: 1.0000 | Freq: Three times a day (TID) | 0 refills | Status: AC

## 2023-12-22 MED ORDER — LANCETS MISC: 1.0000 | 0 refills | Status: AC

## 2023-12-22 MED ORDER — OZEMPIC (0.25 OR 0.5 MG/DOSE) 2 MG/3ML ~~LOC~~ SOPN
0.5000 mg | PEN_INJECTOR | SUBCUTANEOUS | 1 refills | Status: AC
  Filled 2023-12-27 – 2024-01-04 (×2): qty 9, 84d supply, fill #0
  Filled 2024-03-29: qty 9, 84d supply, fill #1

## 2023-12-22 MED ORDER — LANCET DEVICE MISC: 1.0000 | Freq: Three times a day (TID) | 0 refills | Status: AC

## 2023-12-22 MED ORDER — BLOOD GLUCOSE TEST VI STRP: ORAL_STRIP | 2 refills | Status: AC

## 2023-12-22 NOTE — Progress Notes (Signed)
 Subjective:    Patient ID: Holly Farley, female    DOB: 08-27-1980, 43 y.o.   MRN: [id number]  Holly Farley is a very pleasant 43 y.o. female with a history of type 2 diabetes, gastric bypass surgery, fibromyalgia, migraines, menorrhagia with regular cycle who presents today for follow-up of diabetes.  1) Type 2 Diabetes:  Current medications include: Ozempic  0.5 mg weekly. She's noticed mild constipation.   She is checking her blood glucose 0 times daily.  Last A1C: 6.2 in July 2025, 5.1 today Last Eye Exam: UTD Last Foot Exam: Due Pneumonia Vaccination: 2016 Urine Microalbumin: UTD Statin: None    Dietary changes since last visit: Weight loss of 11 pounds. Reduced portion sizes. Reduced intake of pasta.    Exercise: Active  Wt Readings from Last 3 Encounters:  12/22/23 138 lb (62.6 kg)  09/17/23 149 lb (67.6 kg)  10/29/21 150 lb (68 kg)       Review of Systems  Eyes:  Negative for visual disturbance.  Respiratory:  Negative for shortness of breath.   Cardiovascular:  Negative for chest pain.  Neurological:  Negative for numbness.         Past Medical History:  Diagnosis Date   Acute bilateral thoracic back pain 03/03/2018   Diabetes mellitus without complication (HCC)    Fever and chills 07/04/2018   Fibromyalgia affecting shoulder region    pt has it at right neck and right shoulder   Glucosuria 04/10/2020   History of diet-controlled diabetes 01/13/2018   History of Rocky [place] spotted fever 05/16/2019   Formatting of this note might be different from the original.  RMSF diagnosed spring 2020     Insulin  resistance    Migraines    PCOS (polycystic ovarian syndrome)    with infertility, followed by [facility]   RMSF [facility] spotted fever) 09/01/2018   Syncope 05/16/2018   Weight loss, unintentional 01/13/2018    Social History   Socioeconomic History   Marital status: Married    Spouse name: Not on file   Number of children: 2    Years of education: Not on file   Highest education level: Bachelor's degree (e.g., BA, AB, BS)  Occupational History   Not on file  Tobacco Use   Smoking status: Former    Current packs/day: 0.00    Average packs/day: 0.8 packs/day for 7.3 years (5.4 ttl pk-yrs)    Types: Cigarettes    Start date: 2002    Quit date: 06/06/2007    Years since quitting: 16.5   Smokeless tobacco: Never  Vaping Use   Vaping status: Never Used  Substance and Sexual Activity   Alcohol use: No   Drug use: No   Sexual activity: Yes    Birth control/protection: Surgical    Comment: tubal ligation  Other Topics Concern   Not on file  Social History Narrative   Caffeine- 300 mg daily   Social Drivers of Health   Financial Resource Strain: Low Risk  (09/13/2023)   Overall Financial Resource Strain (CARDIA)    Difficulty of Paying Living Expenses: Not very hard  Food Insecurity: No Food Insecurity (09/13/2023)   Hunger Vital Sign    Worried About Running Out of Food in the Last Year: Never true    Ran Out of Food in the Last Year: Never true  Transportation Needs: No Transportation Needs (09/13/2023)   PRAPARE - Transportation    Lack of Transportation (Medical): No    Lack  of Transportation (Non-Medical): No  Physical Activity: Sufficiently Active (09/13/2023)   Exercise Vital Sign    Days of Exercise per Week: 5 days    Minutes of Exercise per Session: 30 min  Stress: Stress Concern Present (09/13/2023)   [employer] of Occupational Health - Occupational Stress Questionnaire    Feeling of Stress: To some extent  Social Connections: Socially Integrated (09/13/2023)   Social Connection and Isolation Panel    Frequency of Communication with Friends and Family: More than three times a week    Frequency of Social Gatherings with Friends and Family: Once a week    Attends Religious Services: More than 4 times per year    Active Member of Clubs or Organizations: Yes    Attends Hospital doctor: More than 4 times per year    Marital Status: Married  Catering manager Violence: Not on file    Past Surgical History:  Procedure Laterality Date   CESAREAN SECTION     ESOPHAGOGASTRODUODENOSCOPY     GASTRIC ROUX-EN-Y N/A 09/24/2014   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;  Surgeon: Donnice Lunger, MD;  Location: [facility];  Service: General;  Laterality: N/A;   TUBAL LIGATION     WISDOM TOOTH EXTRACTION Bilateral 10/23/2021    Family History  Problem Relation Age of Onset   Bipolar disorder Mother    Migraines Mother    Bipolar disorder Brother    Diabetes Maternal Aunt    Diabetes Maternal Aunt    Diabetes Maternal Aunt    Diabetes Paternal Uncle    Hypertrophic cardiomyopathy Paternal Grandmother 71   Hypertrophic cardiomyopathy Cousin 31   Diabetes Other     No Known Allergies  Current Outpatient Medications on File Prior to Visit  Medication Sig Dispense Refill   azelastine (ASTELIN) 0.1 % nasal spray USE 1 SPRAY IN BOTH NOSRILS TWICE DAILY 30 mL 0   Calcium Citrate-Vitamin D  (CALCIUM + D PO) Take 1 tablet by mouth 3 (three) times daily.      Cyanocobalamin ([name]) 2500 MCG SUBL Place 2,500 mcg under the tongue daily.     escitalopram  (LEXAPRO ) 5 MG tablet Take 1 tablet (5 mg total) by mouth daily. For anxiety 90 tablet 0   Multiple Vitamin (MULTIVITAMIN WITH MINERALS) TABS tablet Take 1 tablet by mouth daily.     fluticasone  (FLONASE ) 50 MCG/ACT nasal spray Place 2 sprays into both nostrils daily. (Patient not taking: Reported on 12/22/2023) 16 g 6   No current facility-administered medications on file prior to visit.    BP 102/64   Pulse 93   Temp (!) 97.4 F (36.3 C) (Temporal)   Ht 5' 2 (1.575 m)   Wt 138 lb (62.6 kg)   LMP 12/11/2023   SpO2 99%   BMI 25.24 kg/m  Objective:   Physical Exam Cardiovascular:     Rate and Rhythm: Normal rate and regular rhythm.  Pulmonary:     Effort: Pulmonary effort is  normal.     Breath sounds: Normal breath sounds.  Musculoskeletal:     Cervical back: Neck supple.  Skin:    General: Skin is warm and dry.  Neurological:     Mental Status: She is alert and oriented to person, place, and time.  Psychiatric:        Mood and Affect: Mood normal.     Physical Exam        Assessment & Plan:  Type 2 diabetes mellitus with hyperglycemia,  without long-term current use of insulin  [facility]) Assessment & Plan: Improved and well controlled with A1C of 5.1 today.  Continue Ozempic  0.5 mg weekly. Foot exam today.  Prescription for glucometer provided today  Follow up in 6 months.  Orders: -     POCT glycosylated hemoglobin (Hb A1C) -     Ozempic  (0.25 or 0.5 MG/DOSE); Inject 0.5 mg into the skin once a week. for diabetes.  Dispense: 9 mL; Refill: 1 -     Blood Glucose Monitoring Suppl; 1 each by Does not apply route in the morning, at noon, and at bedtime. May substitute to any manufacturer covered by patient's insurance.  Dispense: 1 each; Refill: 0 -     Blood Glucose Test; Check blood sugar once daily. May substitute to any manufacturer covered by patient's insurance.  Dispense: 100 strip; Refill: 2 -     Lancet Device; 1 each by Does not apply route in the morning, at noon, and at bedtime. May substitute to any manufacturer covered by patient's insurance.  Dispense: 1 each; Refill: 0 -     Lancets; 1 each by Does not apply route as directed. Dispense based on patient and insurance preference. Use up to four times daily as directed. (FOR ICD-10 E10.9, E11.9).  Dispense: 100 each; Refill: 0    Assessment and Plan Assessment & Plan         Comer MARLA Gaskins, NP    History of Present Illness

## 2023-12-22 NOTE — Patient Instructions (Signed)
 Continue Ozempic  0.5 mg weekly for diabetes.  Please schedule a follow up visit for 6 months for a diabetes check.  It was a pleasure to see you today!

## 2023-12-22 NOTE — Assessment & Plan Note (Addendum)
 Improved and well controlled with A1C of 5.1 today.  Continue Ozempic  0.5 mg weekly. Foot exam today.  Prescription for glucometer provided today  Follow up in 6 months.

## 2023-12-27 ENCOUNTER — Inpatient Hospital Stay: Attending: Oncology

## 2023-12-27 ENCOUNTER — Inpatient Hospital Stay: Admitting: Oncology

## 2023-12-27 ENCOUNTER — Encounter: Payer: Self-pay | Admitting: Oncology

## 2023-12-27 ENCOUNTER — Other Ambulatory Visit: Payer: Self-pay

## 2023-12-27 VITALS — BP 93/76 | HR 81 | Temp 98.1°F | Resp 18 | Ht 62.0 in | Wt 140.2 lb

## 2023-12-27 DIAGNOSIS — N920 Excessive and frequent menstruation with regular cycle: Secondary | ICD-10-CM | POA: Diagnosis not present

## 2023-12-27 DIAGNOSIS — R112 Nausea with vomiting, unspecified: Secondary | ICD-10-CM | POA: Insufficient documentation

## 2023-12-27 DIAGNOSIS — D508 Other iron deficiency anemias: Secondary | ICD-10-CM

## 2023-12-27 DIAGNOSIS — Z87891 Personal history of nicotine dependence: Secondary | ICD-10-CM | POA: Diagnosis not present

## 2023-12-27 DIAGNOSIS — D509 Iron deficiency anemia, unspecified: Secondary | ICD-10-CM | POA: Diagnosis present

## 2023-12-27 DIAGNOSIS — Z9884 Bariatric surgery status: Secondary | ICD-10-CM | POA: Insufficient documentation

## 2023-12-27 NOTE — Progress Notes (Signed)
 Hematology/Oncology Consult note [facility]  [phone] [phone]  Patient Care Team: Gretta Comer POUR, NP as PCP - General (Internal Medicine) Lonni Slain, MD as PCP - Cardiology (Cardiology)   Name of the patient: Holly Farley  [id number]  01/04/1981   Date of visit: 12/27/23  Diagnosis-history of iron  deficiency anemia  Chief complaint/ Reason for visit-reestablish follow-up for iron  deficiency anemia  Heme/Onc history: Patient is a 43 year old female with a past medically history significant for gastric bypass surgery in 2016, polycystic ovarian disease, type 2 diabetes, fibromyalgia referred for iron  deficiency anemia.  Her CBC from 07/31/2021 showed white count of 5.3, H&H of 13.9/41.8 and a platelet count of 260.  She was noted to have a hemoglobin of 9.2 about a year ago which subsequently improved.  Iron  studies presently show a ferritin of 4.1 with an elevated TIBC of 466 with an iron  saturation of 14%.   Note that her menstrual cycles can be heavy in the last for about 7 days.  She has 2 adult children and does not wish to have any more pregnancies.  No family history of colon cancer.  Denies any consistent use of NSAIDs.  She has ongoing neck pain and has a diagnosis of fibromyalgia and hopes to get back with neurology  Interval history- Discussed the use of AI scribe software for clinical note transcription with the patient, who gave verbal consent to proceed.  History of Present Illness   Holly Farley is a 43 year old female with iron  deficiency who presents for evaluation of low iron  reserves and related symptoms.  She has a history of iron  deficiency and received iron  infusions in August 2023. Recent blood work on October 7th showed a ferritin level of 9, and her hemoglobin was normal at 13 in July. She has experienced low ferritin levels in the past, with a level of 8.8 three months ago, but her body has  maintained normal hemoglobin levels despite this.  She experiences extreme fatigue, feeling the need to sleep if she stops moving, and has difficulty sleeping, which she believes contributes to her constant tiredness. She also experiences easy bruising, particularly during her menstrual cycle or with minor trauma.  For the past two weeks, she has experienced nausea and vomiting, which coincides with her use of Ozempic  for the past three months. 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  Her menstrual cycles occur monthly, sometimes a few days earlier than expected, and are consistently heavy, lasting seven to ten days. She reports bleeding through clothing and passing large, dark red blood clots. She has not seen a gynecologist recently, with her last visit being during pregnancy.  She underwent gastric bypass surgery. No bleeding in stool is noticed.  Her current medications include Ozempic , which she started three months ago, and she has previously received Venofer  for iron  supplementation.     ECOG PS- 0 Pain scale- 0   Review of systems- Review of Systems  Constitutional:  Positive for malaise/fatigue. Negative for chills, fever and weight loss.  HENT:  Negative for congestion, ear discharge and nosebleeds.   Eyes:  Negative for blurred vision.  Respiratory:  Negative for cough, hemoptysis, sputum production, shortness of breath and wheezing.   Cardiovascular:  Negative for chest pain, palpitations, orthopnea and claudication.  Gastrointestinal:  Negative for abdominal pain, blood in  stool, constipation, diarrhea, heartburn, melena, nausea and vomiting.  Genitourinary:  Negative for dysuria, flank pain, frequency, hematuria and urgency.       Menorrhagia  Musculoskeletal:  Negative for back pain, joint pain and myalgias.  Skin:  Negative for rash.  Neurological:   Negative for dizziness, tingling, focal weakness, seizures, weakness and headaches.  Endo/Heme/Allergies:  Does not bruise/bleed easily.  Psychiatric/Behavioral:  Negative for depression and suicidal ideas. The patient does not have insomnia.       No Known Allergies   Past Medical History:  Diagnosis Date   Acute bilateral thoracic back pain 03/03/2018   Diabetes mellitus without complication (HCC)    Fever and chills 07/04/2018   Fibromyalgia affecting shoulder region    pt has it at right neck and right shoulder   Glucosuria 04/10/2020   History of diet-controlled diabetes 01/13/2018   History of Rocky [place] spotted fever 05/16/2019   Formatting of this note might be different from the original.  RMSF diagnosed spring 2020     Insulin  resistance    Migraines    PCOS (polycystic ovarian syndrome)    with infertility, followed by [facility]   RMSF [facility] spotted fever) 09/01/2018   Syncope 05/16/2018   Weight loss, unintentional 01/13/2018     Past Surgical History:  Procedure Laterality Date   CESAREAN SECTION     ESOPHAGOGASTRODUODENOSCOPY     GASTRIC ROUX-EN-Y N/A 09/24/2014   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;  Surgeon: Donnice Lunger, MD;  Location: [facility];  Service: General;  Laterality: N/A;   TUBAL LIGATION     WISDOM TOOTH EXTRACTION Bilateral 10/23/2021    Social History   Socioeconomic History   Marital status: Married    Spouse name: Not on file   Number of children: 2   Years of education: Not on file   Highest education level: Bachelor's degree (e.g., BA, AB, BS)  Occupational History   Not on file  Tobacco Use   Smoking status: Former    Current packs/day: 0.00    Average packs/day: 0.8 packs/day for 7.3 years (5.4 ttl pk-yrs)    Types: Cigarettes    Start date: 2002    Quit date: 06/06/2007    Years since quitting: 16.5   Smokeless tobacco: Never  Vaping Use   Vaping status: Never Used  Substance and  Sexual Activity   Alcohol use: No   Drug use: No   Sexual activity: Yes    Birth control/protection: Surgical    Comment: tubal ligation  Other Topics Concern   Not on file  Social History Narrative   Caffeine- 300 mg daily   Social Drivers of Health   Financial Resource Strain: Low Risk  (09/13/2023)   Overall Financial Resource Strain (CARDIA)    Difficulty of Paying Living Expenses: Not very hard  Food Insecurity: No Food Insecurity (09/13/2023)   Hunger Vital Sign    Worried About Running Out of Food in the Last Year: Never true    Ran Out of Food in the Last Year: Never true  Transportation Needs: No Transportation Needs (09/13/2023)   PRAPARE - Administrator, Civil Service (Medical): No    Lack of Transportation (Non-Medical): No  Physical Activity: Sufficiently Active (09/13/2023)   Exercise Vital Sign    Days of Exercise per Week: 5 days    Minutes of Exercise per Session: 30 min  Stress: Stress Concern Present (09/13/2023)   [employer] of Occupational Health -  Occupational Stress Questionnaire    Feeling of Stress: To some extent  Social Connections: Socially Integrated (09/13/2023)   Social Connection and Isolation Panel    Frequency of Communication with Friends and Family: More than three times a week    Frequency of Social Gatherings with Friends and Family: Once a week    Attends Religious Services: More than 4 times per year    Active Member of [employer] or Organizations: Yes    Attends Engineer, Structural: More than 4 times per year    Marital Status: Married  Catering Manager Violence: Not on file    Family History  Problem Relation Age of Onset   Bipolar disorder Mother    Migraines Mother    Bipolar disorder Brother    Diabetes Maternal Aunt    Diabetes Maternal Aunt    Diabetes Maternal Aunt    Diabetes Paternal Uncle    Hypertrophic cardiomyopathy Paternal Grandmother 92   Hypertrophic cardiomyopathy Cousin 31   Diabetes  Other      Current Outpatient Medications:    azelastine (ASTELIN) 0.1 % nasal spray, USE 1 SPRAY IN BOTH NOSRILS TWICE DAILY, Disp: 30 mL, Rfl: 0   Blood Glucose Monitoring Suppl DEVI, 1 each by Does not apply route in the morning, at noon, and at bedtime. May substitute to any manufacturer covered by patient's insurance., Disp: 1 each, Rfl: 0   Calcium Citrate-Vitamin D  (CALCIUM + D PO), Take 1 tablet by mouth 3 (three) times daily. , Disp: , Rfl:    Cyanocobalamin ([name]) 2500 MCG SUBL, Place 2,500 mcg under the tongue daily., Disp: , Rfl:    escitalopram  (LEXAPRO ) 5 MG tablet, Take 1 tablet (5 mg total) by mouth daily. For anxiety, Disp: 90 tablet, Rfl: 0   Glucose Blood (BLOOD GLUCOSE TEST STRIPS) STRP, Check blood sugar once daily. May substitute to any manufacturer covered by patient's insurance., Disp: 100 strip, Rfl: 2   Lancet Device MISC, 1 each by Does not apply route in the morning, at noon, and at bedtime. May substitute to any manufacturer covered by patient's insurance., Disp: 1 each, Rfl: 0   Lancets MISC, 1 each by Does not apply route as directed. Dispense based on patient and insurance preference. Use up to four times daily as directed. (FOR ICD-10 E10.9, E11.9)., Disp: 100 each, Rfl: 0   Multiple Vitamin (MULTIVITAMIN WITH MINERALS) TABS tablet, Take 1 tablet by mouth daily., Disp: , Rfl:    Semaglutide ,0.25 or 0.5MG /DOS, (OZEMPIC , 0.25 OR 0.5 MG/DOSE,) 2 MG/3ML SOPN, Inject 0.5 mg into the skin once a week. for diabetes., Disp: 9 mL, Rfl: 1   fluticasone  (FLONASE ) 50 MCG/ACT nasal spray, Place 2 sprays into both nostrils daily. (Patient not taking: Reported on 12/22/2023), Disp: 16 g, Rfl: 6  Physical exam:  Vitals:   12/27/23 1509  BP: 93/76  Pulse: 81  Resp: 18  Temp: 98.1 F (36.7 C)  TempSrc: Tympanic  SpO2: 100%  Weight: 140 lb 3.2 oz (63.6 kg)  Height: 5' 2 (1.575 m)   Physical Exam Cardiovascular:     Rate and Rhythm: Normal rate and regular  rhythm.     Heart sounds: Normal heart sounds.  Pulmonary:     Effort: Pulmonary effort is normal.     Breath sounds: Normal breath sounds.  Skin:    General: Skin is warm and dry.  Neurological:     Mental Status: She is alert and oriented to person, place, and time.  I have personally reviewed labs listed below:    Latest Ref Rng & Units 09/17/2023   10:41 AM  CMP  Glucose 70 - 99 mg/dL 853   BUN 6 - 23 mg/dL 11   Creatinine 9.59 - 1.20 mg/dL 9.27   Sodium 864 - 854 mEq/L 139   Potassium 3.5 - 5.1 mEq/L 4.6   Chloride 96 - 112 mEq/L 103   CO2 19 - 32 mEq/L 29   Calcium 8.4 - 10.5 mg/dL 9.2   Total Protein 6.0 - 8.3 g/dL 6.4   Total Bilirubin 0.2 - 1.2 mg/dL 0.4   Alkaline Phos 39 - 117 U/L 38   AST 0 - 37 U/L 17   ALT 0 - 35 U/L 17       Latest Ref Rng & Units 09/17/2023   10:41 AM  CBC  WBC 4.0 - 10.5 K/uL 5.9   Hemoglobin 12.0 - 15.0 g/dL 86.8   Hematocrit 63.9 - 46.0 % 40.0   Platelets 150.0 - 400.0 K/uL 263.0       Assessment and plan- Patient is a 43 y.o. female here to reestablish follow-up for iron  deficiency anemia  Assessment and Plan    Iron  deficiency without anemia Low ferritin level of 9 indicates iron  deficiency. Normal hemoglobin at 13 three months ago. Heavy menstrual cycles and gastric bypass likely contribute to deficiency. Risk of anemia if heavy bleeding continues. - Schedule IV iron  infusions, Monoferric preferred pending insurance, otherwise Venofer .  Discussed risks and benefits of IV iron  including all but not limited to possible risk of infusion reaction.  Patient understands and agrees to proceed as planned - Refer to GYN for heavy menstrual cycle management. - Repeat blood work in two months to assess iron  levels and hemoglobin.  Heavy menstrual bleeding Heavy cycles lasting 7-10 days with significant bleeding and clotting likely contribute to iron  deficiency. - Refer to GYN for evaluation and management. Discuss potential  interventions such as IUD.  Nausea and vomiting Nausea and vomiting may be related to recent initiation of Ozempic .         Visit Diagnosis 1. Other iron  deficiency anemia      Dr. Annah Skene, MD, MPH [facility] [phone] 12/27/2023 3:44 PM

## 2023-12-27 NOTE — Progress Notes (Signed)
 Patient doing well, no new or acute concerns at this time.

## 2023-12-28 ENCOUNTER — Telehealth: Payer: Self-pay

## 2023-12-28 ENCOUNTER — Other Ambulatory Visit (HOSPITAL_COMMUNITY): Payer: Self-pay

## 2023-12-28 NOTE — Telephone Encounter (Signed)
 Pharmacy Patient Advocate Encounter   Received notification from Onbase that prior authorization for Ozempic  2 is required/requested.   Insurance verification completed.   The patient is insured through [facility].   Per test claim: patient plan has identified a drug overlap. No prescription drug benefit. Current approved PA expires 10/26/26.

## 2023-12-31 ENCOUNTER — Inpatient Hospital Stay

## 2023-12-31 VITALS — BP 105/68 | HR 81 | Temp 97.2°F | Resp 18

## 2023-12-31 DIAGNOSIS — D509 Iron deficiency anemia, unspecified: Secondary | ICD-10-CM | POA: Diagnosis not present

## 2023-12-31 MED ORDER — IRON SUCROSE 20 MG/ML IV SOLN
200.0000 mg | INTRAVENOUS | Status: DC
  Administered 2023-12-31: 200 mg via INTRAVENOUS
  Filled 2023-12-31: qty 10

## 2023-12-31 NOTE — Patient Instructions (Signed)

## 2024-01-01 NOTE — Telephone Encounter (Signed)
 Thank you! My apologies, but does this mean her Ozempic  was approved or not approved?

## 2024-01-04 ENCOUNTER — Other Ambulatory Visit: Payer: Self-pay

## 2024-01-04 NOTE — Telephone Encounter (Signed)
 Noted. Will await patient to update if she has difficulty obtaining her Ozempic .

## 2024-01-05 ENCOUNTER — Encounter: Payer: Self-pay | Admitting: Oncology

## 2024-01-05 ENCOUNTER — Other Ambulatory Visit: Payer: Self-pay

## 2024-01-07 ENCOUNTER — Inpatient Hospital Stay: Attending: Oncology

## 2024-01-07 VITALS — BP 96/74 | HR 75 | Temp 98.7°F | Resp 18

## 2024-01-07 DIAGNOSIS — Z79899 Other long term (current) drug therapy: Secondary | ICD-10-CM | POA: Insufficient documentation

## 2024-01-07 DIAGNOSIS — D509 Iron deficiency anemia, unspecified: Secondary | ICD-10-CM | POA: Insufficient documentation

## 2024-01-07 MED ORDER — IRON SUCROSE 20 MG/ML IV SOLN
200.0000 mg | INTRAVENOUS | Status: DC
  Administered 2024-01-07: 200 mg via INTRAVENOUS
  Filled 2024-01-07: qty 10

## 2024-01-07 NOTE — Patient Instructions (Signed)

## 2024-01-14 ENCOUNTER — Inpatient Hospital Stay

## 2024-01-14 VITALS — BP 96/72 | HR 84 | Temp 97.2°F

## 2024-01-14 DIAGNOSIS — D509 Iron deficiency anemia, unspecified: Secondary | ICD-10-CM

## 2024-01-14 MED ORDER — IRON SUCROSE 20 MG/ML IV SOLN
200.0000 mg | INTRAVENOUS | Status: DC
  Administered 2024-01-14: 200 mg via INTRAVENOUS
  Filled 2024-01-14: qty 10

## 2024-01-14 NOTE — Patient Instructions (Signed)

## 2024-01-21 ENCOUNTER — Inpatient Hospital Stay

## 2024-01-21 VITALS — BP 101/66 | HR 92 | Temp 97.3°F | Resp 16

## 2024-01-21 DIAGNOSIS — D509 Iron deficiency anemia, unspecified: Secondary | ICD-10-CM

## 2024-01-21 MED ORDER — IRON SUCROSE 20 MG/ML IV SOLN
200.0000 mg | INTRAVENOUS | Status: DC
  Administered 2024-01-21: 200 mg via INTRAVENOUS
  Filled 2024-01-21: qty 10

## 2024-01-21 NOTE — Patient Instructions (Signed)

## 2024-01-25 ENCOUNTER — Inpatient Hospital Stay

## 2024-01-26 ENCOUNTER — Telehealth: Payer: Self-pay

## 2024-01-26 NOTE — Telephone Encounter (Signed)
 Outbound call to check status of referral sent 12/27/23 for polycystic ovaries and menorrhagia with regular cycle.  Spoke with representative in appointments who indicated they are booked out for new patient appointments. Representative indicated she will touch base with Lauraine and advised I follow up in one week.

## 2024-02-10 NOTE — Telephone Encounter (Signed)
 Outbound call spoke to Holly Farley who indicated there were 2 separate attempts to reach out to patient on 01/26/24 and 02/09/24; voice messages were left.

## 2024-02-10 NOTE — Telephone Encounter (Signed)
 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  Also patient mentioned she missed her last infusion; she said she had her days mixed up. Advised patient to keep an eye on my chart for when her infusion will be rescheduled; patient verbalized understanding.

## 2024-02-17 ENCOUNTER — Encounter: Payer: Self-pay | Admitting: Oncology

## 2024-02-17 NOTE — Telephone Encounter (Signed)
Patient has been scheduled for infusion

## 2024-02-21 ENCOUNTER — Inpatient Hospital Stay: Attending: Oncology

## 2024-02-21 DIAGNOSIS — D509 Iron deficiency anemia, unspecified: Secondary | ICD-10-CM | POA: Insufficient documentation

## 2024-02-21 DIAGNOSIS — D508 Other iron deficiency anemias: Secondary | ICD-10-CM

## 2024-02-21 LAB — TSH: TSH: 0.503 u[IU]/mL (ref 0.350–4.500)

## 2024-02-21 LAB — CBC (CANCER CENTER ONLY)
HCT: 39.4 % (ref 36.0–46.0)
Hemoglobin: 13.4 g/dL (ref 12.0–15.0)
MCH: 30.6 pg (ref 26.0–34.0)
MCHC: 34 g/dL (ref 30.0–36.0)
MCV: 90 fL (ref 80.0–100.0)
Platelet Count: 267 K/uL (ref 150–400)
RBC: 4.38 MIL/uL (ref 3.87–5.11)
RDW: 12.2 % (ref 11.5–15.5)
WBC Count: 6.4 K/uL (ref 4.0–10.5)
nRBC: 0 % (ref 0.0–0.2)

## 2024-02-21 LAB — IRON AND TIBC
Iron: 99 ug/dL (ref 28–170)
Saturation Ratios: 31 % (ref 10.4–31.8)
TIBC: 322 ug/dL (ref 250–450)
UIBC: 223 ug/dL

## 2024-02-21 LAB — FERRITIN: Ferritin: 61 ng/mL (ref 11–307)

## 2024-02-21 LAB — VITAMIN B12: Vitamin B-12: 1949 pg/mL — ABNORMAL HIGH (ref 180–914)

## 2024-03-06 ENCOUNTER — Inpatient Hospital Stay

## 2024-03-16 NOTE — Progress Notes (Unsigned)
 "  Patient ID: Holly Farley, female   DOB: September 07, 1980, 44 y.o.   MRN: [id number]  Reason for Consult: No chief complaint on file.   Referred by Melanee Annah BROCKS, MD  Subjective:     HPI:  Holly Farley is a 44 y.o. female ***  Past Medical History:  Diagnosis Date   Acute bilateral thoracic back pain 03/03/2018   Diabetes mellitus without complication (HCC)    Fever and chills 07/04/2018   Fibromyalgia affecting shoulder region    pt has it at right neck and right shoulder   Glucosuria 04/10/2020   History of diet-controlled diabetes 01/13/2018   History of [place] spotted fever 05/16/2019   Formatting of this note might be different from the original.  RMSF diagnosed spring 2020     Insulin  resistance    Migraines    PCOS (polycystic ovarian syndrome)    with infertility, followed by [facility]   RMSF [facility] spotted fever) 09/01/2018   Syncope 05/16/2018   Weight loss, unintentional 01/13/2018   Family History  Problem Relation Age of Onset   Bipolar disorder Mother    Migraines Mother    Bipolar disorder Brother    Diabetes Maternal Aunt    Diabetes Maternal Aunt    Diabetes Maternal Aunt    Diabetes Paternal Uncle    Hypertrophic cardiomyopathy Paternal Grandmother 76   Hypertrophic cardiomyopathy Cousin 72   Diabetes Other    Past Surgical History:  Procedure Laterality Date   CESAREAN SECTION     ESOPHAGOGASTRODUODENOSCOPY     GASTRIC ROUX-EN-Y N/A 09/24/2014   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;  Surgeon: Donnice Lunger, MD;  Location: [facility];  Service: General;  Laterality: N/A;   TUBAL LIGATION     WISDOM TOOTH EXTRACTION Bilateral 10/23/2021    Short Social History:  Social History   Tobacco Use   Smoking status: Former    Current packs/day: 0.00    Average packs/day: 0.8 packs/day for 7.3 years (5.4 ttl pk-yrs)    Types: Cigarettes    Start date: 2002    Quit date: 06/06/2007    Years since quitting:  16.7   Smokeless tobacco: Never  Substance Use Topics   Alcohol use: No    Allergies[1]  Current Outpatient Medications  Medication Sig Dispense Refill   azelastine (ASTELIN) 0.1 % nasal spray USE 1 SPRAY IN BOTH NOSRILS TWICE DAILY 30 mL 0   Blood Glucose Monitoring Suppl DEVI 1 each by Does not apply route in the morning, at noon, and at bedtime. May substitute to any manufacturer covered by patient's insurance. 1 each 0   Calcium Citrate-Vitamin D  (CALCIUM + D PO) Take 1 tablet by mouth 3 (three) times daily.      Cyanocobalamin  ([name]) 2500 MCG SUBL Place 2,500 mcg under the tongue daily.     escitalopram  (LEXAPRO ) 5 MG tablet Take 1 tablet (5 mg total) by mouth daily. For anxiety 90 tablet 0   fluticasone  (FLONASE ) 50 MCG/ACT nasal spray Place 2 sprays into both nostrils daily. (Patient not taking: Reported on 12/22/2023) 16 g 6   Glucose Blood (BLOOD GLUCOSE TEST STRIPS) STRP Check blood sugar once daily. May substitute to any manufacturer covered by patient's insurance. 100 strip 2   Lancets MISC 1 each by Does not apply route as directed. Dispense based on patient and insurance preference. Use up to four times daily as directed. (FOR ICD-10 E10.9, E11.9). 100 each 0  Multiple Vitamin (MULTIVITAMIN WITH MINERALS) TABS tablet Take 1 tablet by mouth daily.     Semaglutide ,0.25 or 0.5MG /DOS, (OZEMPIC , 0.25 OR 0.5 MG/DOSE,) 2 MG/3ML SOPN Inject 0.5 mg into the skin once a week. for diabetes. 9 mL 1   No current facility-administered medications for this visit.    REVIEW OF SYSTEMS      Objective:  Objective   There were no vitals filed for this visit. There is no height or weight on file to calculate BMI.  Physical Exam  Data: ***     Assessment/Plan:     ***     Slater Rains CNM       [1] No Known Allergies  "

## 2024-03-17 ENCOUNTER — Inpatient Hospital Stay: Attending: Oncology

## 2024-03-17 ENCOUNTER — Ambulatory Visit: Admitting: Advanced Practice Midwife

## 2024-03-17 ENCOUNTER — Encounter: Payer: Self-pay | Admitting: Advanced Practice Midwife

## 2024-03-17 VITALS — BP 107/69 | HR 95 | Ht 62.0 in | Wt 132.4 lb

## 2024-03-17 VITALS — BP 116/70 | HR 81 | Temp 97.7°F | Resp 16

## 2024-03-17 DIAGNOSIS — E282 Polycystic ovarian syndrome: Secondary | ICD-10-CM

## 2024-03-17 DIAGNOSIS — Z862 Personal history of diseases of the blood and blood-forming organs and certain disorders involving the immune mechanism: Secondary | ICD-10-CM

## 2024-03-17 DIAGNOSIS — N920 Excessive and frequent menstruation with regular cycle: Secondary | ICD-10-CM | POA: Diagnosis not present

## 2024-03-17 DIAGNOSIS — D509 Iron deficiency anemia, unspecified: Secondary | ICD-10-CM

## 2024-03-17 MED ORDER — SODIUM CHLORIDE 0.9% FLUSH
10.0000 mL | Freq: Once | INTRAVENOUS | Status: AC | PRN
  Administered 2024-03-17: 10 mL
  Filled 2024-03-17: qty 10

## 2024-03-17 MED ORDER — IRON SUCROSE 20 MG/ML IV SOLN
200.0000 mg | INTRAVENOUS | Status: DC
  Administered 2024-03-17: 200 mg via INTRAVENOUS

## 2024-03-17 MED ORDER — JUNEL FE 1/20 1-20 MG-MCG PO TABS: 1.0000 | ORAL_TABLET | Freq: Every day | ORAL | 11 refills | Status: AC

## 2024-03-31 ENCOUNTER — Other Ambulatory Visit: Payer: Self-pay

## 2024-04-14 ENCOUNTER — Inpatient Hospital Stay: Admitting: Oncology

## 2024-04-14 ENCOUNTER — Inpatient Hospital Stay
